# Patient Record
Sex: Female | Born: 1955 | Race: White | Hispanic: No | Smoking: Former smoker
Health system: Southern US, Community
[De-identification: ages and names within clinical notes are randomized; demographics above are authoritative.]

## PROBLEM LIST (undated history)

## (undated) DIAGNOSIS — F1920 Other psychoactive substance dependence, uncomplicated: Secondary | ICD-10-CM

## (undated) DIAGNOSIS — I10 Essential (primary) hypertension: Secondary | ICD-10-CM

## (undated) DIAGNOSIS — E039 Hypothyroidism, unspecified: Secondary | ICD-10-CM

## (undated) DIAGNOSIS — E785 Hyperlipidemia, unspecified: Secondary | ICD-10-CM

## (undated) DIAGNOSIS — N3010 Interstitial cystitis (chronic) without hematuria: Secondary | ICD-10-CM

## (undated) DIAGNOSIS — Q059 Spina bifida, unspecified: Secondary | ICD-10-CM

## (undated) DIAGNOSIS — G43909 Migraine, unspecified, not intractable, without status migrainosus: Secondary | ICD-10-CM

## (undated) DIAGNOSIS — G8929 Other chronic pain: Secondary | ICD-10-CM

## (undated) HISTORY — DX: Essential (primary) hypertension: I10

## (undated) HISTORY — DX: Other chronic pain: G89.29

## (undated) HISTORY — DX: Other psychoactive substance dependence, uncomplicated: F19.20

## (undated) HISTORY — DX: Hypothyroidism, unspecified: E03.9

## (undated) HISTORY — PX: ABDOMINAL HYSTERECTOMY: SHX81

## (undated) HISTORY — DX: Hyperlipidemia, unspecified: E78.5

## (undated) HISTORY — DX: Spina bifida, unspecified: Q05.9

## (undated) HISTORY — PX: COLONOSCOPY: SHX174

## (undated) HISTORY — DX: Interstitial cystitis (chronic) without hematuria: N30.10

## (undated) HISTORY — DX: Migraine, unspecified, not intractable, without status migrainosus: G43.909

## (undated) HISTORY — PX: OOPHORECTOMY: SHX86

---

## 1998-01-28 ENCOUNTER — Other Ambulatory Visit: Admission: RE | Admit: 1998-01-28 | Discharge: 1998-01-28 | Payer: Self-pay | Admitting: *Deleted

## 1998-09-06 ENCOUNTER — Encounter: Payer: Self-pay | Admitting: Neurosurgery

## 1998-09-06 ENCOUNTER — Ambulatory Visit (HOSPITAL_COMMUNITY): Admission: RE | Admit: 1998-09-06 | Discharge: 1998-09-06 | Payer: Self-pay | Admitting: Neurosurgery

## 1998-12-15 ENCOUNTER — Other Ambulatory Visit: Admission: RE | Admit: 1998-12-15 | Discharge: 1998-12-15 | Payer: Self-pay | Admitting: *Deleted

## 2000-03-05 ENCOUNTER — Encounter: Payer: Self-pay | Admitting: Neurosurgery

## 2000-03-05 ENCOUNTER — Encounter: Admission: RE | Admit: 2000-03-05 | Discharge: 2000-03-05 | Payer: Self-pay | Admitting: Neurosurgery

## 2000-06-05 DIAGNOSIS — I10 Essential (primary) hypertension: Secondary | ICD-10-CM

## 2000-06-05 HISTORY — DX: Essential (primary) hypertension: I10

## 2000-12-30 ENCOUNTER — Ambulatory Visit (HOSPITAL_COMMUNITY): Admission: RE | Admit: 2000-12-30 | Discharge: 2000-12-30 | Payer: Self-pay | Admitting: Family Medicine

## 2000-12-30 ENCOUNTER — Encounter: Payer: Self-pay | Admitting: Family Medicine

## 2001-02-24 ENCOUNTER — Emergency Department (HOSPITAL_COMMUNITY): Admission: EM | Admit: 2001-02-24 | Discharge: 2001-02-24 | Payer: Self-pay | Admitting: *Deleted

## 2001-02-24 ENCOUNTER — Encounter: Payer: Self-pay | Admitting: *Deleted

## 2001-04-11 ENCOUNTER — Encounter (INDEPENDENT_AMBULATORY_CARE_PROVIDER_SITE_OTHER): Payer: Self-pay | Admitting: Specialist

## 2001-04-11 ENCOUNTER — Observation Stay (HOSPITAL_COMMUNITY): Admission: RE | Admit: 2001-04-11 | Discharge: 2001-04-12 | Payer: Self-pay | Admitting: *Deleted

## 2001-12-30 ENCOUNTER — Other Ambulatory Visit: Admission: RE | Admit: 2001-12-30 | Discharge: 2001-12-30 | Payer: Self-pay | Admitting: *Deleted

## 2003-01-05 ENCOUNTER — Other Ambulatory Visit: Admission: RE | Admit: 2003-01-05 | Discharge: 2003-01-05 | Payer: Self-pay | Admitting: *Deleted

## 2004-04-27 ENCOUNTER — Other Ambulatory Visit: Admission: RE | Admit: 2004-04-27 | Discharge: 2004-04-27 | Payer: Self-pay | Admitting: *Deleted

## 2004-08-28 ENCOUNTER — Emergency Department (HOSPITAL_COMMUNITY): Admission: EM | Admit: 2004-08-28 | Discharge: 2004-08-28 | Payer: Self-pay | Admitting: Emergency Medicine

## 2005-02-08 ENCOUNTER — Ambulatory Visit (HOSPITAL_COMMUNITY): Admission: RE | Admit: 2005-02-08 | Discharge: 2005-02-08 | Payer: Self-pay | Admitting: Family Medicine

## 2005-02-08 ENCOUNTER — Inpatient Hospital Stay (HOSPITAL_COMMUNITY): Admission: AD | Admit: 2005-02-08 | Discharge: 2005-02-10 | Payer: Self-pay | Admitting: Family Medicine

## 2005-02-09 ENCOUNTER — Encounter (INDEPENDENT_AMBULATORY_CARE_PROVIDER_SITE_OTHER): Payer: Self-pay | Admitting: General Surgery

## 2005-06-13 ENCOUNTER — Other Ambulatory Visit: Admission: RE | Admit: 2005-06-13 | Discharge: 2005-06-13 | Payer: Self-pay | Admitting: *Deleted

## 2006-10-02 ENCOUNTER — Other Ambulatory Visit: Admission: RE | Admit: 2006-10-02 | Discharge: 2006-10-02 | Payer: Self-pay | Admitting: *Deleted

## 2007-07-02 ENCOUNTER — Ambulatory Visit (HOSPITAL_COMMUNITY): Admission: RE | Admit: 2007-07-02 | Discharge: 2007-07-02 | Payer: Self-pay | Admitting: Urology

## 2007-07-18 ENCOUNTER — Ambulatory Visit (HOSPITAL_BASED_OUTPATIENT_CLINIC_OR_DEPARTMENT_OTHER): Admission: RE | Admit: 2007-07-18 | Discharge: 2007-07-18 | Payer: Self-pay | Admitting: Urology

## 2007-11-18 ENCOUNTER — Other Ambulatory Visit: Admission: RE | Admit: 2007-11-18 | Discharge: 2007-11-18 | Payer: Self-pay | Admitting: Gynecology

## 2008-01-03 ENCOUNTER — Encounter: Admission: RE | Admit: 2008-01-03 | Discharge: 2008-01-03 | Payer: Self-pay | Admitting: Gynecology

## 2010-06-25 ENCOUNTER — Encounter: Payer: Self-pay | Admitting: Family Medicine

## 2010-06-26 ENCOUNTER — Encounter: Payer: Self-pay | Admitting: Preventative Medicine

## 2010-10-18 NOTE — Op Note (Signed)
Cheryl Gordon, Cheryl Gordon                 ACCOUNT NO.:  [id number]   MEDICAL RECORD NO.:  [id number]          PATIENT TYPE:  AMB   LOCATION:  [facility]                         FACILITY:  [facility]   PHYSICIAN:  Excell Seltzer. Annabell Howells, [name]    DATE OF BIRTH:  April 09, 1956   DATE OF PROCEDURE:  07/18/2007  DATE OF DISCHARGE:                               OPERATIVE REPORT   PROCEDURE:  Cystoscopy, hydrodistention of the bladder, urethral  dilation, instillation of Pyridium and Marcaine.   PREOPERATIVE DIAGNOSIS:  Painful bladder, rule out interstitial  cystitis.   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   SURGEON:  Excell Seltzer. Annabell Howells, [name]   ANESTHESIA:  General.   COMPLICATIONS:  None.   INDICATIONS:  Ms. Braaksma is a 55 year old white female who I originally  saw in December for voiding symptoms with some incontinence and vaginal  pain.  She had nocturia x2.  Urodynamic study demonstrated some cough  and Valsalva triggered detrusor instability with mixed incontinence, and  she did have bladder pain at a capacity of 259 mL.  It was felt that  cystoscopy, HOD, urethral calibration, and instillation of Pyridium and  Marcaine were indicated to evaluate for interstitial cystitis.   FINDINGS AND PROCEDURE:  The patient was given Cipro.  She was taken to  the operating room, and general anesthetic was induced.  She was placed  in lithotomy position.  Her perineum and genitalia were prepped with  Betadine solution.  She was draped in the usual sterile fashion.   Cystoscopy was initially attempted with a 22-French scope, but the  urethral meatus was too narrow.  She was calibrated from 22 to 26-French  with female sounds.  She was tight at 22, and the 22-French cystoscope  was then inserted.  Examination with the 12 and 70-degree lenses  revealed a pale mucosa.  There were some minimal areas of increased  vascularity that were suggestive of possible  inflammation, but this was  a very subtle finding.  The ureteral orifices were unremarkable,  effluxing clear urine. After a thorough inspection of the bladder,  hydrodistention was performed under 80 cm of water pressure to capacity.  The bladder was left filled for 3 minutes and then drained.  Inspection  revealed some glomerular hemorrhages, primarily in the area of the  trigone but also in one portion of the posterior wall, but they were not  diffuse and extensive.  Her capacity under anesthesia was 800 mL.  After  completion of the hydrodistention, her urethra was further calibrated to  32-French with female sounds, and then the  bladder was instilled with 30 mL of 0.25% Marcaine with 400 mg of  crushed Pyridium.  She was taken down from lithotomy position.  Her  anesthetic was reversed.   She was moved to the recovery room in stable condition.  There were no  complications.      Excell Seltzer. Annabell Howells, [name]  Electronically Signed     JJW/MEDQ  D:  07/18/2007  T:  07/19/2007  Job:  [id number]   cc:  Hulan Saas, [name]   Almedia Balls. [name], [name]  Fax: [phone]

## 2010-10-21 NOTE — Discharge Summary (Signed)
Cheryl Gordon, Cheryl Gordon NO.:  [id number]   MEDICAL RECORD NO.:  [id number]          PATIENT TYPE:  INP   LOCATION:  A223                          FACILITY:  [facility]   PHYSICIAN:  Dalia Heading, [name]  DATE OF BIRTH:  1955-11-06   DATE OF ADMISSION:  02/08/2005  DATE OF DISCHARGE:  09/08/2006LH                                 DISCHARGE SUMMARY   HOSPITAL COURSE:  The patient is a 55 year old, white female who presented  to the hospital with cholecystitis secondary to cholelithiasis.  This was  confirmed by ultrasound as an outpatient.  Her liver enzyme tests were noted  to be elevated.  Her common bile duct was noted be slightly dilated.  Surgery consultation was obtained and the patient was taken the following  day, on February 09, 2005, and underwent laparoscopic cholecystectomy with  cholangiograms.  She tolerated the procedure well.  Cholangiograms were  negative. Her diet was advanced without difficulty.   The following day, her liver enzyme tests have almost returned to normal.  Other labs are stable.  She is tolerating p.o. well.  She is being  discharged home on postop day #1 in good and improving condition.   FOLLOW UP:  The patient is to follow up Dr. Franky Macho on February 16, 2005.   DISCHARGE MEDICATIONS:  1.  Percocet 1-2 tablets p.o. q.6h. p.r.n. pain.  2.  She is to resume all her other medications as previously prescribed.   DISCHARGE DIAGNOSES:  1.  Cholecystitis, cholelithiasis.  2.  Hypothyroidism.  3.  Chronic back pain.   PROCEDURE:  Laparoscopic cholecystectomy with cholangiograms on February 09, 2005.      Dalia Heading, [name]  Electronically Signed     MAJ/MEDQ  D:  02/10/2005  T:  02/10/2005  Job:  [id number]

## 2010-10-21 NOTE — Discharge Summary (Signed)
[facility]  Patient:    Cheryl Gordon, Cheryl Gordon [facility] Visit Number: [id number] MRN: [id number]          Service Type: GYN Location: 4W 0456 01 Attending Physician:  Collene Schlichter Dictated by:   Almedia Balls. [name], [name] Admit Date:  04/11/2001 Discharge Date: 04/12/2001                             Discharge Summary  HISTORY OF PRESENT ILLNESS:  Patient is a 55 year old with abnormal uterine bleeding, pelvic pain, history of endometriosis, probable adenomyosis, and ovarian cysts for hysterectomy and bilateral salpingo-oophorectomy.  The remainder of her history and physical are as previously dictated.  LABORATORY DATA:  Preoperative hemoglobin 12.2.  Chemistries are all normal.  HOSPITAL COURSE:  The patient was taken to the operating room on the morning of April 11, 2001, at which time abdominal supracervical hysterectomy and bilateral salpingo-oophorectomy were performed without difficulty.  Patient did well postoperatively.  Diet and ambulation were progressed over the evening of November 7 and on the morning of November 8.  On the morning of November 8, she was experiencing only pain which was relieved with pain medication, and it was felt that she could be discharged at this time.  FINAL DIAGNOSES:  Abnormal uterine bleeding, pelvic pain, history of endometriosis, probable adenomyosis, ovarian cysts.  OPERATION:  Abdominal supracervical hysterectomy, bilateral salpingo-oophorectomy.  Pathology report is unavailable at the time of dictation.  DISPOSITION:  Discharged home to return to the office in two weeks for follow-up.  She is instructed to gradually progress her activities over several weeks at home and to limit lifting and driving for two weeks.  She was fully ambulatory, on a regular diet, and in good condition at the time of discharge.  She was given a prescription for Dilaudid 2 mg #30 or generic to be taken one or two q.4h. p.r.n. pain, and  doxycycline 100 mg #12 to be taken one b.i.d., and Vivelle dot 0.05 mg patch to be changed twice a week.  She will call for any problems. Dictated by:   Almedia Balls [name], [name] Attending Physician:  Collene Schlichter DD:  04/12/01 TD:  04/13/01 Job: [id number]

## 2010-10-21 NOTE — Op Note (Signed)
[facility]  Patient:    Cheryl Gordon, Cheryl Gordon [facility] Visit Number: [id number] MRN: [id number]          Service Type: GYN Location: 4W 0456 01 Attending Physician:  Collene Schlichter Dictated by:   Almedia Balls. [name], [name] Proc. Date: 04/11/01 Admit Date:  04/11/2001 Discharge Date: 04/12/2001   CC:         Leatha Gilding. Mezer, [name]   Operative Report  PREOPERATIVE DIAGNOSIS:  Abnormal uterine bleeding, pelvic pain, probable adenomyosis, history of endometriosis, recurring ovarian cyst.  POSTOPERATIVE DIAGNOSIS:  Abnormal uterine bleeding, pelvic pain, probable adenomyosis, history of endometriosis, recurring ovarian cyst; pending pathology.  OPERATION/PROCEDURE:  Abdominal supracervical hysterectomy, bilateral salpingo-oophorectomy.  ANESTHESIA:  General oral tracheal.  SURGEON:  Almedia Balls. [name], [name]  FIRST ASSISTANT:  Leatha Gilding. Mezer, [name]  INDICATIONS FOR SURGERY:  The patient is a 55 year old with the above noted problems who was counseled as to the need for further to treat these problems. She was fully counseled as to the nature of the procedure and the risks involved to include risks of anesthesia, injury to bowel, bladder, blood vessels, ureters, postoperative hemorrhage, infection, and recuperation.  She was also counseled as to the need for hormone replacement and/or suppression following surgery.  She fully understands all of these considerations and wishes to proceed on April 11, 2001.  OPERATIVE FINDINGS:  On entry into the abdomen, there were noted to be dense scarrings along the site of previous C-section incisions.  On entry into the abdomen, peritoneal cavity proper, the lower liver edge, area of the gallbladder, spleen, kidneys, periaortic areas, and appendix were normal.  We achieved palpation, and/or visualization.  The uterus was a bit posterior and approximately 8-[redacted] weeks gestation size and soft.  There were adhesions involving the  anterior peritoneum and bladder flap to the lower uterine segment and cervix.  The ovaries had simple cysts present.  DESCRIPTION OF PROCEDURE:  With the patient under general anesthesia, prepped and draped in the usual sterile fashion, with the Foley catheter in the bladder, previous surgical incisions were excised, and the incision was carried into the peritoneal cavity without difficulty.  Self retaining retractor was placed, and the bowel was packed off.  Kelly clamps were used to clamp the uterine ovarian anastomosis, tubes, and round ligaments bilaterally for traction and hemostasis.  Round ligaments were transected using Bovie electrocoagulation bilaterally with development of a bladder flap anteriorly and lysis of adhesions in this area, as well as entry in the retroperitoneal space.  The infundibulopelvic ligaments bilaterally were identified well above the course of the ureters, clamped, cut, and doubly ligated with 1 chromic catgut. The uterine vessels were then skeletonized, and then clamped using Heaney clamps, cut and suture ligated with 1 chromic catgut.  The bladder was advanced over the lower uterine segment and cervix without difficulty by using Bovie electrocoagulation.  Heaney clamps were used to clamp the remaining portions of the Cardinal ligaments bilaterally which were then cut and suture ligated with #1 chromic catgut.  It was then possible to excise the uterine fundus, tubes, and ovaries as a single specimen by using Bovie electrocoagulation to transect the cervix.  Bleeders were rendered hemostatic with interrupted figure-of-eight sutures of 1 chromic catgut.  The endocervix was coagulated using Bovie electrocoagulation for destruction of any remaining endocervical glands.  The area was then lavaged with copious amounts of lactated Ringers solution. After noting hemostasis was maintained and that sponge and instrument counts were correct, the peritoneum  was  closed with a continuous suture of #0 Vicryl. The fascia was closed with 2 sutures of #0 Vicryl which were brought from the lateral aspect of the incision and tied separately in the midline. Subcutaneous fat was reapproximated with interrupted sutures of #1 chromic catgut.  Skin was closed with a subcuticular suture of 3-0 plain catgut. Estimated blood loss 250 mL.  The patient was taken to the recovery room in good condition with clear urine and a Foley catheter tubing.  She will be placed on 23 hour observation following surgery. Dictated by:   Almedia Balls [name], [name] Attending Physician:  Collene Schlichter DD:  04/11/01 TD:  04/12/01 Job: [phone]

## 2010-10-21 NOTE — H&P (Signed)
NAME:  Cheryl Gordon, BRAMBLETT                 ACCOUNT NO.:  [id number]   MEDICAL RECORD NO.:  [id number]          PATIENT TYPE:  INP   LOCATION:  A223                          FACILITY:  [facility]   PHYSICIAN:  Scott A. Gerda Diss, MD    DATE OF BIRTH:  July 11, 1955   DATE OF ADMISSION:  02/08/2005  DATE OF DISCHARGE:  LH                                HISTORY & PHYSICAL   CHIEF COMPLAINT:  Abdominal pain and discomfort.   HISTORY OF PRESENT ILLNESS:  This is a 55 year old white female who has had  a couple different bouts over the past week of discomfort into the chest and  right upper quadrant.  She felt nauseated.  In addition to this, she relates  moderate right upper quadrant tenderness and some flank pain and discomfort.  She was seen in the office.  Ultrasound was ordered.  Ultrasound showed  gallstones.  She was having some soreness, but no severe pain.  She was  treated with Percocet, but later that evening she had severe pain and  discomfort, and, therefore, she was brought here to the emergency department  and admitted.   PAST MEDICAL HISTORY:  1.  Hypothyroidism.  2.  Hypertension.  3.  Chronic pain.  4.  Migraines.   MEDICATION LIST:  1.  Synthroid 0.1 mg daily.  2.  Altace 10 mg b.i.d.  3.  Estradiol 1 mg nightly.  (At one time, she was on Toprol and clonidine, but husband, who gave the  information regarding the medications, does not think she is still on that.)  1.  She was given a prescription for Percocet approximately 12 hours before      admission to use for pain, #24, no refills.   FAMILY HISTORY:  She has a family history of heart disease and hypertension.   ALLERGIES:  1.  SULFA.  Also has had side effects with -  1.  EFFEXOR.  2.  PROZAC.  3.  ZITHROMAX.   SOCIAL HISTORY:  She is married.  She has 4 children.  Does not smoke or  drink.   REVIEW OF SYSTEMS:  See per above.   PHYSICAL EXAMINATION:  GENERAL:  In no acute distress.  HEENT:  Tympanic membranes  __________.  NECK:  Supple.  CHEST:  Clear to auscultation.  No crackles.  HEART:  Regular.  No murmurs.  ABDOMEN:  Soft with right upper quadrant tenderness to palpation.  Mild  flank discomfort on the right side.  Lower abdomen nontender.  EXTREMITIES:  No edema.  NEUROLOGIC:  Grossly normal.   LABORATORY DATA:  White count 10,000.  The patient does have slight left  shift.  In addition, the ultrasound showed gallstones and cholelithiasis, an  equivocal finding for acute cholecystitis, mild CVD, and intrahepatic  dilatation.   ASSESSMENT AND PLAN:  Acute cholecystitis.  Pain medication, nausea  medicine.  Cover with antibiotics.  Go ahead and have Dr. Franky Macho see  the patient.  Mostly likely will need to have her gallbladder taken out in  the next 1-2 days, and follow up accordingly in  the hospital.      Lorin Picket A. Gerda Diss, MD  Electronically Signed     SAL/MEDQ  D:  02/09/2005  T:  02/09/2005  Job:  [id number]

## 2010-10-21 NOTE — Op Note (Signed)
Cheryl Gordon, Cheryl Gordon                 ACCOUNT NO.:  [id number]   MEDICAL RECORD NO.:  [id number]          PATIENT TYPE:  INP   LOCATION:  A223                          FACILITY:  [facility]   PHYSICIAN:  Dalia Heading, [name]  DATE OF BIRTH:  02-Sep-1955   DATE OF PROCEDURE:  02/09/2005  DATE OF DISCHARGE:                                 OPERATIVE REPORT   PREOPERATIVE DIAGNOSIS:  Cholecystitis, cholelithiasis.   POSTOPERATIVE DIAGNOSIS:  Cholecystitis, cholelithiasis.   PROCEDURE:  Laparoscopic cholecystectomy with cholangiograms.   SURGEON:  Dr. Franky Macho.   ANESTHESIA:  General endotracheal.   INDICATIONS:  The patient is a 55 year old white female who presented to  [facility] yesterday evening with cholecystitis secondary to  cholelithiasis confirmed by ultrasound. Risks and benefits of the procedure  including bleeding, infection, hepatobiliary injury, and the possibility of  open procedure were fully explained to the patient, who gave informed  consent.   PROCEDURE NOTE:  The patient was placed in supine position. After induction  of general endotracheal anesthesia, the abdomen was prepped and draped using  the usual sterile technique with Betadine. Surgical site confirmation was  performed.   A supraumbilical incision was made down to the fascia. A Veress needle was  introduced into abdominal cavity, and confirmation of placement was done  using the saline drop test. The abdomen was then insufflated to 16 mmHg  pressure. An 11-mm trocar was introduced into the abdominal cavity under  direct visualization without difficulty. The patient was placed in reversed  Trendelenburg position. An additional 11-mm trocar was placed epigastric  region and 5-mm trocars were placed in the right upper quadrant and right  flank regions. Liver was inspected and noted to normal limits. The  gallbladder was retracted superiorly and laterally. The dissection was begun  around the  infundibulum of the gallbladder. The cystic duct was first  identified. Its juncture to the infundibulum fully identified. A single  Endoclip was placed proximally on the cystic duct. An incision was made in  the cystic duct, and a cholangiogram catheter was inserted. Under  fluoroscopy, cholangiograms were performed. The hepatobiliary tree showed no  evidence of intrahepatic or any filling defects. The dye flowed freely into  the duodenum. The system was then flushed with normal saline, and the  catheter was removed. Endoclips were placed distally on the cystic duct, and  cystic duct was divided. The cystic artery was likewise ligated and divided.  The gallbladder was freed away from the gallbladder fossa using Bovie  electrocautery. The gallbladder delivered through the epigastric trocar site  using an EndoCatch bag. The gallbladder fossa was inspected, and no abnormal  bleeding or bile leakage was noted. Surgicel was placed in the gallbladder  fossa. All fluid and air were then evacuated from the abdominal cavity prior  to removal of the trocars.   All wounds were irrigated with normal saline. All wounds were injected with  0.5% Sensorcaine. The supraumbilical fascia was reapproximated using a 0  Vicryl interrupted suture. All skin incisions were closed using staples.  Betadine ointment and dry sterile  dressings were applied.   All tape and needle counts were correct at the end of the procedure. The  patient was extubated in the operating room and went back to recovery room  awake in stable condition.   COMPLICATIONS:  None.   SPECIMEN:  Gallbladder.   BLOOD LOSS:  Minimal.      Dalia Heading, [name]  Electronically Signed     MAJ/MEDQ  D:  02/09/2005  T:  02/09/2005  Job:  [id number]

## 2010-10-21 NOTE — Procedures (Signed)
NAME:  SAVAYA, HAKES                 ACCOUNT NO.:  [id number]   MEDICAL RECORD NO.:  [id number]          PATIENT TYPE:  INP   LOCATION:  A223                          FACILITY:  [facility]   PHYSICIAN:  Scott A. Gerda Diss, MD    DATE OF BIRTH:  Oct 12, 1955   DATE OF PROCEDURE:  02/08/2005  DATE OF DISCHARGE:  02/10/2005                                EKG INTERPRETATION   Normal sinus rhythm. No acute ST segment changes. Normal EKG.      Scott A. Gerda Diss, MD  Electronically Signed     SAL/MEDQ  D:  02/20/2005  T:  02/20/2005  Job:  [id number]

## 2011-02-24 LAB — POCT I-STAT 4, (NA,K, GLUC, HGB,HCT)
Glucose, Bld: 97
HCT: 43
Hemoglobin: 14.6
Operator id: 280881
Potassium: 3.7
Sodium: 135

## 2012-08-30 ENCOUNTER — Other Ambulatory Visit: Payer: Self-pay | Admitting: Family Medicine

## 2012-09-19 ENCOUNTER — Ambulatory Visit (INDEPENDENT_AMBULATORY_CARE_PROVIDER_SITE_OTHER): Payer: BC Managed Care – PPO | Admitting: Family Medicine

## 2012-09-19 ENCOUNTER — Encounter: Payer: Self-pay | Admitting: Family Medicine

## 2012-09-19 VITALS — BP 110/80 | Temp 98.1°F | Ht 64.0 in | Wt 152.4 lb

## 2012-09-19 DIAGNOSIS — E785 Hyperlipidemia, unspecified: Secondary | ICD-10-CM

## 2012-09-19 DIAGNOSIS — E039 Hypothyroidism, unspecified: Secondary | ICD-10-CM

## 2012-09-19 DIAGNOSIS — N390 Urinary tract infection, site not specified: Secondary | ICD-10-CM

## 2012-09-19 LAB — POCT URINALYSIS DIPSTICK
Spec Grav, UA: 1.01
pH, UA: 5

## 2012-09-19 MED ORDER — CIPROFLOXACIN HCL 250 MG PO TABS: 250.0000 mg | ORAL_TABLET | Freq: Two times a day (BID) | ORAL | Status: AC

## 2012-09-19 NOTE — Progress Notes (Signed)
  Subjective:    Patient ID: Cheryl Gordon, female    DOB: Feb 10, 1956, 57 y.o.   MRN: [id number]  Urinary Tract Infection  This is a new problem. The current episode started in the past 7 days. The problem occurs every urination. The problem has been gradually worsening. The quality of the pain is described as burning. The pain is at a severity of 4/10. The pain is moderate. There has been no fever. She is sexually active. There is a history of pyelonephritis. Associated symptoms include chills, frequency and hesitancy. Pertinent negatives include no flank pain, hematuria or nausea. She has tried nothing for the symptoms. The treatment provided no relief. Her past medical history is significant for recurrent UTIs.    Patient also notes that she has been working on her cholesterol intake. Exercising more. Wonders about her numbers.  Patient also working on compliance with thyroid medicine. Reports overall energy improved. Due to have TSH. Review of Systems  Constitutional: Positive for chills.  Gastrointestinal: Negative for nausea.  Genitourinary: Positive for hesitancy and frequency. Negative for hematuria and flank pain.       Objective:   Physical Exam  Alert no acute distress. Vitals stable. Thyroid nonpalpable. Lungs clear. Heart regular in rhythm. No true CVA tenderness.      Assessment & Plan:  Impression #1 UTI 2-4 white blood cells per high-power field. #2 low thyroid status uncertain. #3 hyperlipidemia status uncertain. Plan Cipro as prescribed. Appropriate blood work. WSL

## 2012-09-30 ENCOUNTER — Other Ambulatory Visit: Payer: Self-pay | Admitting: Family Medicine

## 2012-11-15 ENCOUNTER — Other Ambulatory Visit: Payer: Self-pay | Admitting: Family Medicine

## 2012-11-18 ENCOUNTER — Encounter: Payer: Self-pay | Admitting: *Deleted

## 2012-11-21 ENCOUNTER — Other Ambulatory Visit: Payer: Self-pay | Admitting: Family Medicine

## 2012-11-29 ENCOUNTER — Other Ambulatory Visit: Payer: Self-pay | Admitting: Family Medicine

## 2012-12-10 LAB — LIPID PANEL
Cholesterol: 202 mg/dL — ABNORMAL HIGH (ref 0–200)
HDL: 34 mg/dL — ABNORMAL LOW (ref >39)
LDL Cholesterol: 137 mg/dL — ABNORMAL HIGH (ref 0–99)
Total CHOL/HDL Ratio: 5.9 Ratio
Triglycerides: 155 mg/dL — ABNORMAL HIGH (ref <150)
VLDL: 31 mg/dL (ref 0–40)

## 2012-12-10 LAB — TSH: TSH: 0.181 u[IU]/mL — ABNORMAL LOW (ref 0.350–4.500)

## 2012-12-17 ENCOUNTER — Other Ambulatory Visit: Payer: Self-pay | Admitting: *Deleted

## 2012-12-17 MED ORDER — LEVOTHYROXINE SODIUM 125 MCG PO TABS: 137.0000 ug | ORAL_TABLET | Freq: Every day | ORAL | Status: DC

## 2012-12-27 ENCOUNTER — Ambulatory Visit (INDEPENDENT_AMBULATORY_CARE_PROVIDER_SITE_OTHER): Payer: BC Managed Care – PPO | Admitting: Family Medicine

## 2012-12-27 ENCOUNTER — Encounter: Payer: Self-pay | Admitting: Family Medicine

## 2012-12-27 VITALS — BP 98/60 | Temp 98.2°F | Wt 149.2 lb

## 2012-12-27 DIAGNOSIS — N3010 Interstitial cystitis (chronic) without hematuria: Secondary | ICD-10-CM

## 2012-12-27 DIAGNOSIS — E039 Hypothyroidism, unspecified: Secondary | ICD-10-CM

## 2012-12-27 DIAGNOSIS — M549 Dorsalgia, unspecified: Secondary | ICD-10-CM

## 2012-12-27 DIAGNOSIS — N390 Urinary tract infection, site not specified: Secondary | ICD-10-CM

## 2012-12-27 DIAGNOSIS — N3941 Urge incontinence: Secondary | ICD-10-CM

## 2012-12-27 DIAGNOSIS — G8929 Other chronic pain: Secondary | ICD-10-CM

## 2012-12-27 DIAGNOSIS — E785 Hyperlipidemia, unspecified: Secondary | ICD-10-CM

## 2012-12-27 LAB — POCT URINALYSIS DIPSTICK
Spec Grav, UA: <=1.005
pH, UA: 7

## 2012-12-27 NOTE — Progress Notes (Signed)
  Subjective:    Patient ID: Cheryl Gordon, female    DOB: 1955-10-16, 57 y.o.   MRN: [id number]  HPI Up two hours every night to urinate. Hx of increased urinary freq/ literally every hour  Med that start with o  Wears pad if accident  Mixed urinary freq.  Hx of stress and urge incont.--spuse wonders if may need disability.  Wonders if needs to be on disability/very long discussion held in this regard. Patient has history of chronic pain. She also has history of migraine headaches. She is having a lot of fatigue and tiredness. 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  Patient also has history of interstitial cystitis.   Review of Systems No fever no chills no depression no vomiting no rash ROS otherwise negative    Objective:   Physical Exam  Alert no acute distress. Lungs clear. Heart regular rate and rhythm. HEENT normal. Back spine tender to percussion. No CVA tenderness. Urinalysis unremarkable.      Assessment & Plan:  Impression stress and urge incontinence discussed. Detrol and other similar agents did not help much in the past. #2 interstitial cystitis ongoing challenge. #3 chronic pain also ongoing challenge. Complicated by history of substance abuse. #4 hypothyroidism plan prescription written for Vesicare patient to try. Start at 5 mg. Long discussion held about the nature of both bureaucratic in legalistic of pursuing disability status. If patient anddeciding on that pathway I will support her

## 2012-12-29 DIAGNOSIS — G8929 Other chronic pain: Secondary | ICD-10-CM | POA: Insufficient documentation

## 2012-12-29 DIAGNOSIS — M549 Dorsalgia, unspecified: Secondary | ICD-10-CM | POA: Insufficient documentation

## 2012-12-29 DIAGNOSIS — N3010 Interstitial cystitis (chronic) without hematuria: Secondary | ICD-10-CM | POA: Insufficient documentation

## 2012-12-29 DIAGNOSIS — N3941 Urge incontinence: Secondary | ICD-10-CM | POA: Insufficient documentation

## 2013-01-21 ENCOUNTER — Other Ambulatory Visit: Payer: Self-pay | Admitting: Family Medicine

## 2013-01-31 ENCOUNTER — Other Ambulatory Visit: Payer: Self-pay | Admitting: Family Medicine

## 2013-02-14 ENCOUNTER — Other Ambulatory Visit: Payer: Self-pay | Admitting: Family Medicine

## 2013-02-14 ENCOUNTER — Ambulatory Visit (INDEPENDENT_AMBULATORY_CARE_PROVIDER_SITE_OTHER): Payer: BC Managed Care – PPO | Admitting: Urology

## 2013-02-14 DIAGNOSIS — R3989 Other symptoms and signs involving the genitourinary system: Secondary | ICD-10-CM

## 2013-02-14 DIAGNOSIS — N3010 Interstitial cystitis (chronic) without hematuria: Secondary | ICD-10-CM

## 2013-03-27 ENCOUNTER — Other Ambulatory Visit: Payer: Self-pay | Admitting: Family Medicine

## 2013-04-14 ENCOUNTER — Other Ambulatory Visit: Payer: Self-pay | Admitting: Family Medicine

## 2013-05-02 ENCOUNTER — Encounter: Payer: Self-pay | Admitting: Family Medicine

## 2013-05-02 ENCOUNTER — Ambulatory Visit (INDEPENDENT_AMBULATORY_CARE_PROVIDER_SITE_OTHER): Payer: BC Managed Care – PPO | Admitting: Family Medicine

## 2013-05-02 VITALS — BP 128/80 | Ht 64.0 in | Wt 153.4 lb

## 2013-05-02 DIAGNOSIS — M76899 Other specified enthesopathies of unspecified lower limb, excluding foot: Secondary | ICD-10-CM

## 2013-05-02 DIAGNOSIS — M7062 Trochanteric bursitis, left hip: Secondary | ICD-10-CM

## 2013-05-02 MED ORDER — PREDNISONE 20 MG PO TABS: ORAL_TABLET | ORAL | Status: AC

## 2013-05-02 NOTE — Patient Instructions (Signed)
This is trochanteric bursitis

## 2013-05-02 NOTE — Progress Notes (Signed)
   Subjective:    Patient ID: Cheryl Gordon, female    DOB: November 04, 1955, 57 y.o.   MRN: [id number]  Leg Pain  The incident occurred more than 1 week ago. The incident occurred at home. There was no injury mechanism (Started when she was walking on the treadmill). The pain is present in the left leg and left hip. The pain has been constant since onset. She reports no foreign bodies present. The symptoms are aggravated by movement and weight bearing. She has tried NSAIDs, rest and non-weight bearing for the symptoms. The treatment provided no relief.   Throbbing severe at times Seemed to start after treadmill use  Rad down to leg, severe at time  Bad at night, aching,  ibo usually takes two every four hrs, had dental surg recently  Some limping with walking. Lat hip,    Review of Systems No change in urinary habits no change about habits relatively mild low back pain no abdominal pain. ROS otherwise negative    Objective:   Physical Exam  Alert no apparent distress. Lungs clear. Heart regular in rhythm. No spinal tenderness. No [place] Terrace. Negative straight leg raise on left. Positive lateral hip tenderness to deep palpation. Positive pain with external rotation. Distal strength sensation intact.      Assessment & Plan:  Impression trochanteric bursitis fairly severe. Plan prednisone taper. Local measures discussed. Local exercises discussed. WSL

## 2013-06-02 ENCOUNTER — Other Ambulatory Visit (HOSPITAL_COMMUNITY): Payer: Self-pay | Admitting: Orthopedic Surgery

## 2013-06-02 ENCOUNTER — Other Ambulatory Visit: Payer: Self-pay | Admitting: Family Medicine

## 2013-06-02 DIAGNOSIS — M545 Low back pain: Secondary | ICD-10-CM

## 2013-06-04 ENCOUNTER — Ambulatory Visit (HOSPITAL_COMMUNITY)
Admission: RE | Admit: 2013-06-04 | Discharge: 2013-06-04 | Disposition: A | Discharge status: Home / Self Care | Payer: BC Managed Care – PPO | Source: Ambulatory Visit | Attending: Orthopedic Surgery | Admitting: Orthopedic Surgery

## 2013-06-04 ENCOUNTER — Encounter (HOSPITAL_COMMUNITY): Payer: Self-pay

## 2013-06-04 DIAGNOSIS — M545 Low back pain, unspecified: Secondary | ICD-10-CM | POA: Insufficient documentation

## 2013-06-04 DIAGNOSIS — M51379 Other intervertebral disc degeneration, lumbosacral region without mention of lumbar back pain or lower extremity pain: Secondary | ICD-10-CM | POA: Insufficient documentation

## 2013-06-04 DIAGNOSIS — M5137 Other intervertebral disc degeneration, lumbosacral region: Secondary | ICD-10-CM | POA: Insufficient documentation

## 2013-06-04 DIAGNOSIS — M79609 Pain in unspecified limb: Secondary | ICD-10-CM | POA: Insufficient documentation

## 2013-06-04 DIAGNOSIS — Q062 Diastematomyelia: Secondary | ICD-10-CM | POA: Insufficient documentation

## 2013-06-09 ENCOUNTER — Ambulatory Visit (INDEPENDENT_AMBULATORY_CARE_PROVIDER_SITE_OTHER): Payer: BC Managed Care – PPO | Admitting: Family Medicine

## 2013-06-09 ENCOUNTER — Encounter: Payer: Self-pay | Admitting: Family Medicine

## 2013-06-09 VITALS — BP 100/60 | Temp 98.4°F | Ht 64.0 in | Wt 152.1 lb

## 2013-06-09 DIAGNOSIS — J329 Chronic sinusitis, unspecified: Secondary | ICD-10-CM

## 2013-06-09 MED ORDER — CEFDINIR 300 MG PO CAPS: 300.0000 mg | ORAL_CAPSULE | Freq: Two times a day (BID) | ORAL | Status: DC

## 2013-06-09 NOTE — Progress Notes (Signed)
   Subjective:    Patient ID: Cheryl Gordon, female    DOB: September 28, 1955, 58 y.o.   MRN: [id number]  Sore Throat  This is a new problem. The current episode started in the past 7 days. The problem has been unchanged. Neither side of throat is experiencing more pain than the other. The maximum temperature recorded prior to her arrival was 100 - 100.9 F. The fever has been present for 1 to 2 days. The pain is at a severity of 7/10. The pain is moderate. Associated symptoms include vomiting. Associated symptoms comments: Body aches. She has tried acetaminophen for the symptoms. The treatment provided mild relief.    Bad sore throat, felt achey soon after  Felt hot,  Headache and muscle aches  Diminished energy Moderate gunky with blowing nose t max 100.4, then diminished  No a lot of cough,  Tend swollen glands, felt nauseated yest Cheryl Gordon somewhat today   Review of Systems  Gastrointestinal: Positive for vomiting.   no rash no but back pain no urinary frequency ROS otherwise negative     Objective:   Physical Exam  Alert moderate malaise frontal maxillary tenderness pharynx normal neck supple. Lungs intermittent cough heart regular in rhythm.      Assessment & Plan:  Impression 1 rhinosinusitis plan Omnicef twice a day 10 days. Symptomatic care discussed. WSL

## 2013-06-11 ENCOUNTER — Other Ambulatory Visit: Payer: Self-pay | Admitting: Family Medicine

## 2013-07-01 ENCOUNTER — Other Ambulatory Visit: Payer: Self-pay | Admitting: Family Medicine

## 2013-07-30 ENCOUNTER — Other Ambulatory Visit: Payer: Self-pay | Admitting: Family Medicine

## 2013-09-18 ENCOUNTER — Other Ambulatory Visit: Payer: Self-pay | Admitting: Family Medicine

## 2013-09-22 ENCOUNTER — Other Ambulatory Visit: Payer: Self-pay | Admitting: Family Medicine

## 2013-09-26 ENCOUNTER — Ambulatory Visit (INDEPENDENT_AMBULATORY_CARE_PROVIDER_SITE_OTHER): Payer: BC Managed Care – PPO | Admitting: Nurse Practitioner

## 2013-09-26 ENCOUNTER — Encounter: Payer: Self-pay | Admitting: Nurse Practitioner

## 2013-09-26 VITALS — BP 102/68 | Temp 98.2°F | Ht 64.0 in | Wt 149.1 lb

## 2013-09-26 DIAGNOSIS — R350 Frequency of micturition: Secondary | ICD-10-CM

## 2013-09-26 LAB — POCT UA - MICROSCOPIC ONLY
Bacteria, U Microscopic: POSITIVE
RBC, urine, microscopic: NEGATIVE

## 2013-09-26 LAB — POCT URINALYSIS DIPSTICK

## 2013-09-26 MED ORDER — CIPROFLOXACIN HCL 250 MG PO TABS: 250.0000 mg | ORAL_TABLET | Freq: Two times a day (BID) | ORAL | Status: DC

## 2013-09-27 ENCOUNTER — Other Ambulatory Visit: Payer: Self-pay | Admitting: Family Medicine

## 2013-09-29 ENCOUNTER — Encounter: Payer: Self-pay | Admitting: Nurse Practitioner

## 2013-09-29 NOTE — Progress Notes (Signed)
Subjective:  Presents for c/o urinary frequency, urgency and incontinence x 2-3 days. Has IC but these are different symptoms. No dysuria, some pressure with urination. No fever. No nausea or vomiting. No vaginal discharge or pelvic pain. Same sexual partner. No mid back or flank pain. Taking fluids well.  Objective:   BP 102/68  Temp(Src) 98.2 F (36.8 C) (Oral)  Ht 5\' 4"  (1.626 m)  Wt 149 lb 2 oz (67.643 kg)  BMI 25.58 kg/m2 NAD. Alert, oriented. Lungs clear. No CVA or flank tenderness. abd soft, nondistended with mild suprapubic area tenderness. UA not performed due to blue color of urine secondary to med. Urine micro: rare WBC, pos bacteria, rare epi cell.  Assessment: Urinary frequency - Plan: POCT urinalysis dipstick, POCT UA - Microscopic Only  Plan:  Meds ordered this encounter  Medications  . promethazine (PHENERGAN) 25 MG tablet    Sig: TAKE ONE TABLET TWICE DAILY PRN  . ciprofloxacin (CIPRO) 250 MG tablet    Sig: Take 1 tablet (250 mg total) by mouth 2 (two) times daily.    Dispense:  10 tablet    Refill:  0    Order Specific Question:  Supervising Provider    Answer:  Mikey Kirschner [[id number]]   Call back if worsens or persists.

## 2013-10-03 ENCOUNTER — Other Ambulatory Visit: Payer: Self-pay | Admitting: Family Medicine

## 2013-10-03 NOTE — Telephone Encounter (Signed)
Seen 4/24 for dysuria

## 2013-11-07 ENCOUNTER — Other Ambulatory Visit: Payer: Self-pay | Admitting: Family Medicine

## 2013-11-21 ENCOUNTER — Other Ambulatory Visit: Payer: Self-pay | Admitting: Family Medicine

## 2013-12-09 ENCOUNTER — Encounter: Payer: Self-pay | Admitting: Family Medicine

## 2013-12-09 ENCOUNTER — Ambulatory Visit (INDEPENDENT_AMBULATORY_CARE_PROVIDER_SITE_OTHER): Payer: BC Managed Care – PPO | Admitting: Family Medicine

## 2013-12-09 VITALS — BP 120/78 | Temp 98.4°F | Ht 63.0 in | Wt 144.0 lb

## 2013-12-09 DIAGNOSIS — H6122 Impacted cerumen, left ear: Secondary | ICD-10-CM

## 2013-12-09 DIAGNOSIS — H6120 Impacted cerumen, unspecified ear: Secondary | ICD-10-CM

## 2013-12-09 DIAGNOSIS — I10 Essential (primary) hypertension: Secondary | ICD-10-CM

## 2013-12-09 DIAGNOSIS — O169 Unspecified maternal hypertension, unspecified trimester: Secondary | ICD-10-CM

## 2013-12-09 NOTE — Progress Notes (Signed)
   Subjective:    Patient ID: Cheryl Gordon, female    DOB: 19-Aug-1955, 58 y.o.   MRN: [id number]  Otalgia  There is pain in the left ear. The current episode started 1 to 4 weeks ago. Associated symptoms comments: Neck stiffness. She has tried ear drops for the symptoms.   Leg pain, went to dr elsner  Still aching and hurting  Felt that scoliosis might need surg  injec didn't help  injec has helped the pain considerably   wks ago very sens left ear, taking a bath and it started to hurt  achey and painful on that side  Stopped up three wks ago Sig pain No si    Patient claims compliance with blood pressure medicine. Watching salt intake. No obvious side effects. Review of Systems  HENT: Positive for ear pain.    no headache no chest pain no abdominal pain     Objective:   Physical Exam  Alert no acute distress vitals stable blood pressure 124/80 on repeat lungs clear heart regular in rhythm. Left ear packed with wax removed with some difficulty      Assessment & Plan:  Impression 1 hypertension good control maintain same meds. #2 cerumen impaction resolved plan diet exercise discussed maintain same meds. WSL

## 2013-12-10 ENCOUNTER — Other Ambulatory Visit: Payer: Self-pay | Admitting: *Deleted

## 2013-12-10 DIAGNOSIS — O169 Unspecified maternal hypertension, unspecified trimester: Secondary | ICD-10-CM | POA: Insufficient documentation

## 2013-12-10 MED ORDER — INDAPAMIDE 2.5 MG PO TABS: 2.5000 mg | ORAL_TABLET | Freq: Every day | ORAL | Status: DC

## 2013-12-10 MED ORDER — RAMIPRIL 10 MG PO CAPS: 10.0000 mg | ORAL_CAPSULE | Freq: Every day | ORAL | Status: DC

## 2013-12-25 ENCOUNTER — Other Ambulatory Visit: Payer: Self-pay | Admitting: Family Medicine

## 2013-12-25 NOTE — Telephone Encounter (Signed)
Seen 7/7

## 2014-01-06 ENCOUNTER — Other Ambulatory Visit: Payer: Self-pay | Admitting: Nurse Practitioner

## 2014-02-12 ENCOUNTER — Other Ambulatory Visit: Payer: Self-pay | Admitting: Family Medicine

## 2014-02-16 ENCOUNTER — Ambulatory Visit (INDEPENDENT_AMBULATORY_CARE_PROVIDER_SITE_OTHER): Payer: BC Managed Care – PPO | Admitting: Family Medicine

## 2014-02-16 ENCOUNTER — Encounter: Payer: Self-pay | Admitting: Family Medicine

## 2014-02-16 VITALS — BP 132/90 | Temp 98.4°F | Ht 63.0 in | Wt 150.2 lb

## 2014-02-16 DIAGNOSIS — B370 Candidal stomatitis: Secondary | ICD-10-CM

## 2014-02-16 MED ORDER — KETOCONAZOLE 2 % EX CREA: 1.0000 "application " | TOPICAL_CREAM | Freq: Two times a day (BID) | CUTANEOUS | Status: DC

## 2014-02-16 MED ORDER — FLUCONAZOLE 150 MG PO TABS: ORAL_TABLET | ORAL | Status: DC

## 2014-02-16 NOTE — Progress Notes (Signed)
   Subjective:    Patient ID: Cheryl Gordon, female    DOB: 10-25-1955, 58 y.o.   MRN: [id number]  HPI Patient is here today due to mouth irritation that has been present for over 1 week now. Patient states that the corners of her mouth are cracked and "it feels like a film" is inside her mouth. Patient has been eating a lot of tomatoes and vinegar and she believes that it could be related.   Patient states that she has no other concerns at this time.   tums prn   Hx of thrush  Not taking anything else   Review of Systems No headache no chest pain no cough no abdominal pain ROS otherwise negative    Objective:   Physical Exam Alert no acute distress corners of mouth irritated cracked pharynx slight erythema some white patches neck supple. No adenitis lungs clear heart regular in rhythm.       Assessment & Plan:  Impression prolapse day with thrush discussed plan appropriate therapy prescribed. Since Medicare discussed. Warning signs discussed. WSL

## 2014-03-06 ENCOUNTER — Other Ambulatory Visit: Payer: Self-pay | Admitting: Family Medicine

## 2014-03-09 ENCOUNTER — Telehealth: Payer: Self-pay | Admitting: Family Medicine

## 2014-03-09 ENCOUNTER — Other Ambulatory Visit: Payer: Self-pay | Admitting: Family Medicine

## 2014-03-09 NOTE — Telephone Encounter (Signed)
Pt wants you to know that the refill she has called into the med if due  To the fact that the yeast infection come back after taking antibiotics  For a tooth  Just an [place]

## 2014-03-09 NOTE — Telephone Encounter (Signed)
ok 

## 2014-03-30 ENCOUNTER — Other Ambulatory Visit: Payer: Self-pay | Admitting: Family Medicine

## 2014-03-30 ENCOUNTER — Other Ambulatory Visit: Payer: Self-pay | Admitting: Nurse Practitioner

## 2014-04-23 ENCOUNTER — Other Ambulatory Visit: Payer: Self-pay | Admitting: Family Medicine

## 2014-04-24 ENCOUNTER — Other Ambulatory Visit: Payer: Self-pay | Admitting: Family Medicine

## 2014-04-24 NOTE — Telephone Encounter (Signed)
Last seen 02/16/14.

## 2014-05-26 ENCOUNTER — Encounter (INDEPENDENT_AMBULATORY_CARE_PROVIDER_SITE_OTHER): Payer: Self-pay

## 2014-05-26 ENCOUNTER — Encounter: Payer: Self-pay | Admitting: Family Medicine

## 2014-05-26 ENCOUNTER — Ambulatory Visit (INDEPENDENT_AMBULATORY_CARE_PROVIDER_SITE_OTHER): Payer: BC Managed Care – PPO | Admitting: Family Medicine

## 2014-05-26 VITALS — BP 130/90 | Temp 99.3°F | Ht 63.0 in | Wt 153.1 lb

## 2014-05-26 DIAGNOSIS — L03116 Cellulitis of left lower limb: Secondary | ICD-10-CM

## 2014-05-26 MED ORDER — DOXYCYCLINE HYCLATE 100 MG PO TABS: 100.0000 mg | ORAL_TABLET | Freq: Two times a day (BID) | ORAL | Status: DC

## 2014-05-26 NOTE — Progress Notes (Signed)
   Subjective:    Patient ID: Cheryl Gordon, female    DOB: January 25, 1956, 58 y.o.   MRN: [id number]  Rash This is a new problem. The current episode started today. The problem is unchanged. The affected locations include the left lower leg and left arm. The rash is characterized by redness. She was exposed to nothing. Past treatments include nothing. The treatment provided no relief.   Patient states that she has no other concerns at this time.  Patient notes left anterior ankle rash. Painful. Swelling. Rather acute. Recalls no injury. Also small bump left arm. Somewhat itchy. May not even be related.  Review of Systems  Skin: Positive for rash.   No high fever no chills no rash elsewhere no cough no vomiting ROS otherwise negative    Objective:   Physical Exam  Alert no acute distress. H&T normal. Vitals stable. Lungs clear. Heart regular in rhythm. Left ankle: Impressive erythematous patch exquisitely tender left forearm small papule nonspecific      Assessment & Plan:  Impression cellulitis likely related to venous stasis discussed plan appropriate antibiotics. Local measures discussed. Warning signs discussed. WSL

## 2014-05-27 ENCOUNTER — Other Ambulatory Visit: Payer: Self-pay | Admitting: Family Medicine

## 2014-06-01 ENCOUNTER — Encounter: Payer: Self-pay | Admitting: Family Medicine

## 2014-06-01 ENCOUNTER — Ambulatory Visit (HOSPITAL_COMMUNITY)
Admission: RE | Admit: 2014-06-01 | Discharge: 2014-06-01 | Disposition: A | Discharge status: Home / Self Care | Payer: BC Managed Care – PPO | Source: Ambulatory Visit | Attending: Family Medicine | Admitting: Family Medicine

## 2014-06-01 ENCOUNTER — Ambulatory Visit (INDEPENDENT_AMBULATORY_CARE_PROVIDER_SITE_OTHER): Payer: BC Managed Care – PPO | Admitting: Family Medicine

## 2014-06-01 VITALS — BP 130/88 | Temp 99.1°F | Ht 63.0 in | Wt 155.0 lb

## 2014-06-01 DIAGNOSIS — D869 Sarcoidosis, unspecified: Secondary | ICD-10-CM

## 2014-06-01 DIAGNOSIS — L52 Erythema nodosum: Secondary | ICD-10-CM

## 2014-06-01 DIAGNOSIS — L539 Erythematous condition, unspecified: Secondary | ICD-10-CM

## 2014-06-01 LAB — CBC WITH DIFFERENTIAL/PLATELET
Basophils Absolute: 0 10*3/uL (ref 0.0–0.1)
Basophils Relative: 0 % (ref 0–1)
Eosinophils Absolute: 0.3 10*3/uL (ref 0.0–0.7)
Eosinophils Relative: 5 % (ref 0–5)
HCT: 34 % — ABNORMAL LOW (ref 36.0–46.0)
Hemoglobin: 11.7 g/dL — ABNORMAL LOW (ref 12.0–15.0)
Lymphocytes Relative: 24 % (ref 12–46)
Lymphs Abs: 1.4 10*3/uL (ref 0.7–4.0)
MCH: 30.4 pg (ref 26.0–34.0)
MCHC: 34.4 g/dL (ref 30.0–36.0)
MCV: 88.3 fL (ref 78.0–100.0)
MPV: 8.3 fL — ABNORMAL LOW (ref 9.4–12.4)
Monocytes Absolute: 0.5 10*3/uL (ref 0.1–1.0)
Monocytes Relative: 9 % (ref 3–12)
Neutro Abs: 3.5 10*3/uL (ref 1.7–7.7)
Neutrophils Relative %: 62 % (ref 43–77)
Platelets: 323 10*3/uL (ref 150–400)
RBC: 3.85 MIL/uL — ABNORMAL LOW (ref 3.87–5.11)
RDW: 12.9 % (ref 11.5–15.5)
WBC: 5.7 10*3/uL (ref 4.0–10.5)

## 2014-06-01 LAB — SEDIMENTATION RATE: Sed Rate: 73 mm/hr — ABNORMAL HIGH (ref 0–22)

## 2014-06-01 MED ORDER — ETODOLAC 400 MG PO TABS: 400.0000 mg | ORAL_TABLET | Freq: Two times a day (BID) | ORAL | Status: DC

## 2014-06-01 NOTE — Patient Instructions (Signed)
Erythema Nodosum Erythema nodosum is also called "painful red nodules on the legs". Symptoms can include sudden onset of fever, fatigue and joint pains. Red, deep, tender, raised, bruise-like bumps form on the shin, or sometimes on the arms or trunk. The skin over the bumps is usually shiny. The symptoms usually last 1-2 weeks. The symptoms usually improve once the cause is treated. This illness may be caused by strep or fungal infection, drug reaction, pregnancy, or other medical conditions. Treating the cause is important to early recovery. Erythema nodosum occurs at any age and in both sexes but more often in young adult women. Erythema nodosum is not a skin infection. It is a reaction to something internal. In most cases the illness and bumps go away with treatment. You should avoid any medicine that may have caused this reaction. Antihistamine drugs, anti-inflammatory drugs or cortisone medicine may be prescribed to relieve symptoms. SSKI drops ([place]) taken with juice at breakfast, lunch and dinner may have an anti-inflammatory effect and speed the healing. Bed rest and limiting vigorous exercise help to shorten the course of erythema nodosum. Elevation of the affected limb also helps in recovery. As the condition gets better, the bumps flatten and your skin may heal with temporary bruise marks. These dark marks will clear up in several months and they are a good sign that your skin is healing.  SEEK IMMEDIATE MEDICAL CARE IF:   Your condition worsens, or if you have more severe symptoms such a high fever, sore throat, or repeated vomiting. Document Released: 06/29/2004 Document Revised: 08/14/2011 Document Reviewed: 07/03/2008 [facility] Patient Information 2015 [place], [place]. This information is not intended to replace advice given to you by your health care provider. Make sure you discuss any questions you have with your health care provider.

## 2014-06-01 NOTE — Progress Notes (Signed)
   Subjective:    Patient ID: Cheryl Gordon, female    DOB: 21-Mar-1956, 58 y.o.   MRN: [id number]  HPI Seen here on 12/22. Seen last week Dx with cellulitis . Had a small patch of what was thought to be cellulitis on left ankle. Along with a nodule left arm. Now has developed further multiple discrete nodules on both legs left anterior thigh and arm  Red, sore spots spreading on legs.  Swelling in the legs.  Had steroid injections Dec. 4 in her back.   Mild malaise. No fever slight arthralgias. Difficult to tell from neuropathic pain  Review of Systems No headache no cough no fever no shortness of breath no other type of rash    Objective:   Physical Exam  Alert vitals stable HEENT normal lungs clear heart regular in rhythm multiple erythematous nodules both legs left anterior thigh left arm      Assessment & Plan:  c impression erythema nodosum. 25 minutes spent most in discussion. Potential Etiology described the patient this is of unusual condition. Plan screening blood work and x-ray. Anti-inflammatory medicine prescribed. Education information given. WSL

## 2014-06-09 ENCOUNTER — Ambulatory Visit (INDEPENDENT_AMBULATORY_CARE_PROVIDER_SITE_OTHER): Payer: BC Managed Care – PPO | Admitting: Family Medicine

## 2014-06-09 ENCOUNTER — Encounter: Payer: Self-pay | Admitting: Family Medicine

## 2014-06-09 VITALS — Ht 63.0 in | Wt 155.0 lb

## 2014-06-09 DIAGNOSIS — I8002 Phlebitis and thrombophlebitis of superficial vessels of left lower extremity: Secondary | ICD-10-CM

## 2014-06-09 DIAGNOSIS — M7989 Other specified soft tissue disorders: Secondary | ICD-10-CM

## 2014-06-09 NOTE — Progress Notes (Signed)
   Subjective:    Patient ID: Cheryl Gordon, female    DOB: 09-Feb-1956, 59 y.o.   MRN: [id number]  HPI Patient arrives for a follow up on legs- still painful and swollen.  Low gr fever and felt bad anc achey  Places got a very sore over the weekend  If anything the patient is now experiencing more discomfort. And more swelling in the left lower leg.   No high fever or chills but definitely low-grade fever.  No history of thrombophlebitis.  No shortness of breath no chest pain not on birth control pills Review of Systems ROS otherwise negative    Objective:   Physical Exam  Alert moderate malaise vitals stable HET normal lungs clear heart regular rate and rhythm left ankle more swollen erythematous quite tender. Negative Homans sign multiple regions of erythema nodosum still present      Assessment & Plan:  Impression worsening left leg sent for DVT workup plan of note d-dimer elevated and ultrasound revealed superficial thrombophlebitis. With presence throughout the saphenous vein, along with other complicating features such as the erythema nodosum, felt anticoagulation with Coumadin was appropriate. Will initiate bring back for further education. WSL

## 2014-06-10 ENCOUNTER — Other Ambulatory Visit: Payer: Self-pay | Admitting: Family Medicine

## 2014-06-10 ENCOUNTER — Telehealth: Payer: Self-pay | Admitting: *Deleted

## 2014-06-10 ENCOUNTER — Other Ambulatory Visit: Payer: Self-pay | Admitting: *Deleted

## 2014-06-10 ENCOUNTER — Ambulatory Visit (HOSPITAL_COMMUNITY)
Admission: RE | Admit: 2014-06-10 | Discharge: 2014-06-10 | Disposition: A | Discharge status: Home / Self Care | Payer: BC Managed Care – PPO | Source: Ambulatory Visit | Attending: Family Medicine | Admitting: Family Medicine

## 2014-06-10 DIAGNOSIS — I82812 Embolism and thrombosis of superficial veins of left lower extremities: Secondary | ICD-10-CM | POA: Insufficient documentation

## 2014-06-10 DIAGNOSIS — M79662 Pain in left lower leg: Secondary | ICD-10-CM | POA: Diagnosis present

## 2014-06-10 DIAGNOSIS — M7989 Other specified soft tissue disorders: Secondary | ICD-10-CM | POA: Insufficient documentation

## 2014-06-10 DIAGNOSIS — M25572 Pain in left ankle and joints of left foot: Secondary | ICD-10-CM | POA: Insufficient documentation

## 2014-06-10 LAB — D-DIMER, QUANTITATIVE (NOT AT ARMC): D-Dimer, Quant: 2.02 ug/mL-FEU — ABNORMAL HIGH (ref 0.00–0.48)

## 2014-06-10 MED ORDER — WARFARIN SODIUM 5 MG PO TABS: 5.0000 mg | ORAL_TABLET | Freq: Every day | ORAL | Status: DC

## 2014-06-10 MED ORDER — DOXYCYCLINE HYCLATE 100 MG PO TABS: 100.0000 mg | ORAL_TABLET | Freq: Two times a day (BID) | ORAL | Status: DC

## 2014-06-10 NOTE — Telephone Encounter (Signed)
Called pt to let her know results of ultrasound per Dr. Richardson Landry. There is a clot in superficial vein. Start doxy 100 #20 one bid and coumadin 5mg  one daily and recheck with office visit tomorrow with Dr. Richardson Landry. Discussed with pt. meds sent to [facility] and pt scheduled office visit tomorrow for a recheck.

## 2014-06-11 ENCOUNTER — Ambulatory Visit (INDEPENDENT_AMBULATORY_CARE_PROVIDER_SITE_OTHER): Payer: BC Managed Care – PPO | Admitting: Family Medicine

## 2014-06-11 ENCOUNTER — Encounter: Payer: Self-pay | Admitting: Family Medicine

## 2014-06-11 VITALS — Ht 63.0 in | Wt 155.0 lb

## 2014-06-11 DIAGNOSIS — I8002 Phlebitis and thrombophlebitis of superficial vessels of left lower extremity: Secondary | ICD-10-CM | POA: Insufficient documentation

## 2014-06-11 DIAGNOSIS — Z7901 Long term (current) use of anticoagulants: Secondary | ICD-10-CM

## 2014-06-11 DIAGNOSIS — L52 Erythema nodosum: Secondary | ICD-10-CM | POA: Insufficient documentation

## 2014-06-11 LAB — POCT INR: INR: 1.1

## 2014-06-11 NOTE — Progress Notes (Signed)
   Subjective:    Patient ID: Cheryl Gordon, female    DOB: March 17, 1956, 59 y.o.   MRN: [id number]  HPI Patient returns for yet another protracted visit regarding her recent challenges. Please see ultrasound results. This revealed superficial thrombophlebitis with significant involvement of superficial saphenous vein.  Continues to report low-grade fever and tenderness with the other rash associated with erythema no dose some.  Just started the Coumadin last night. 5 mg daily. Has also resumed antibiotics as requested.  Patient arrives for recheck of left leg and discuss results of recent tests.   Review of Systems    no headache no chest pain no shortness of breath no abdominal pain no hemoptysis Objective:   Physical Exam Alert mild malaise vital stable HEENT normal lungs clear heart rare rhythm left heel ankle erythematous tender inflamed somewhat swollen other multiple discrete nodules both legs       Assessment & Plan:  Impression superficial thrombophlebitis with substantial involvement with external saphenous vein have elected to place patient on Coumadin. Rationale discussed. Proper use discussed. #2 erythema nodosum plan check INR again on next Friday. Finish current antibiotics. Recheck with me in 2 weeks. Vascular referral soon.

## 2014-06-11 NOTE — Patient Instructions (Signed)
Warfarin: What You Need to Know Warfarin is an anticoagulant. Anticoagulants help prevent the formation of blood clots. They also help stop the growth of blood clots. Warfarin is sometimes referred to as a "blood thinner."  Normally, when body tissues are cut or damaged, the blood clots in order to prevent blood loss. Sometimes clots form inside your blood vessels and obstruct the flow of blood through your circulatory system (thrombosis). These clots may travel through your bloodstream and become lodged in smaller blood vessels in your brain, which can cause a stroke, or in your lungs (pulmonary embolism). WHO SHOULD USE WARFARIN? Warfarin is prescribed for people at risk of developing harmful blood clots:  People with surgically implanted mechanical heart valves, irregular heart rhythms called atrial fibrillation, and certain clotting disorders.  People who have developed harmful blood clotting in the past, including those who have had a stroke or a pulmonary embolism, or thrombosis in their legs (deep vein thrombosis [DVT]).  People with an existing blood clot, such as a pulmonary embolism. WARFARIN DOSING Warfarin tablets come in different strengths. Each tablet strength is a different color, with the amount of warfarin (in milligrams) clearly printed on the tablet. If the color of your tablet is different than usual when you receive a new prescription, report it immediately to your pharmacist or health care provider. WARFARIN MONITORING The goal of warfarin therapy is to lessen the clotting tendency of blood but not prevent clotting completely. Your health care provider will monitor the anticoagulation effect of warfarin closely and adjust your dose as needed. For your safety, blood tests called prothrombin time (PT) or international normalized ratio (INR) are used to measure the effects of warfarin. Both of these tests can be done with a finger stick or a blood draw. The longer it takes the  blood to clot, the higher the PT or INR. Your health care provider will inform you of your "target" PT or INR range. If, at any time, your PT or INR is above the target range, there is a risk of bleeding. If your PT or INR is below the target range, there is a risk of clotting. Whether you are started on warfarin while you are in the hospital or in your health care provider's office, you will need to have your PT or INR checked within one week of starting the medicine. Initially, some people are asked to have their PT or INR checked as much as twice a week. Once you are on a stable maintenance dose, the PT or INR is checked less often, usually once every 2 to 4 weeks. The warfarin dose may be adjusted if the PT or INR is not within the target range. It is important to keep all laboratory and health care provider follow-up appointments. Not keeping appointments could result in a chronic or permanent injury, pain, or disability because warfarin is a medicine that requires close monitoring. WHAT ARE THE SIDE EFFECTS OF WARFARIN?  Too much warfarin can cause bleeding (hemorrhage) from any part of the body. This may include bleeding from the gums, blood in the urine, bloody or dark stools, a nosebleed that is not easily stopped, coughing up blood, or vomiting blood.  Too little warfarin can increase the risk of blood clots.  Too little or too much warfarin can also increase the risk of a stroke.  Warfarin use may cause a skin rash or irritation, an unusual fever, continual nausea or stomach upset, or severe pain in your joints or back.   SPECIAL PRECAUTIONS WHILE TAKING WARFARIN Warfarin should be taken exactly as directed. It is very important to take warfarin as directed since bleeding or blood clots could result in chronic or permanent injury, pain, or disability.  Take your medicine at the same time every day. If you forget to take your dose, you can take it if it is within 6 hours of when it was  due.  Do not change the dose of warfarin on your own to make up for missed or extra doses.  If you miss more than 2 doses in a row, you should contact your health care provider for advice. Avoid situations that cause bleeding. You may have a tendency to bleed more easily than usual while taking warfarin. The following actions can limit bleeding:  Using a softer toothbrush.  Flossing with waxed floss rather than unwaxed floss.  Shaving with an electric razor rather than a blade.  Limiting the use of sharp objects.  Avoiding potentially harmful activities, such as contact sports. Warfarin and Pregnancy or Breastfeeding  Warfarin is not advised during the first trimester of pregnancy due to an increased risk of birth defects. In certain situations, a woman may take warfarin after her first trimester of pregnancy. A woman who becomes pregnant or plans to become pregnant while taking warfarin should notify her health care provider immediately.  Although warfarin does not pass into breast milk, a woman who wishes to breastfeed while taking warfarin should also consult with her health care provider. Alcohol, Smoking, and Illicit Drug Use  Alcohol affects how warfarin works in the body. It is best to avoid alcoholic drinks or consume very small amounts while taking warfarin. In general, alcohol intake should be limited to 1 oz (30 mL) of liquor, 6 oz (180 mL) of wine, or 12 oz (360 mL) of beer each day. Notify your health care provider if you change your alcohol intake.  Smoking affects how warfarin works. It is best to avoid smoking while taking warfarin. Notify your health care provider if you change your smoking habits.  It is best to avoid all illicit drugs while taking warfarin since there are few studies that show how warfarin interacts with these drugs. Other Medicines and Dietary Supplements Many prescription and over-the-counter medicines can interfere with warfarin. Be sure all of your  health care providers know you are taking warfarin. Notify your health care provider who prescribed warfarin for you or your pharmacist before starting or stopping any new medicines, including over-the-counter vitamins, dietary supplements, and pain medicines. Your warfarin dose may need to be adjusted. Some common over-the-counter medicines that may increase the risk of bleeding while taking warfarin include:   Acetaminophen.  Aspirin.  Nonsteroidal anti-inflammatory medicines (NSAIDs), such as ibuprofen or naproxen.  Vitamin E. Dietary Considerations  Foods that have moderate or high amounts of vitamin K can interfere with warfarin. Avoid major changes in your diet or notify your health care provider before changing your diet. Eat a consistent amount of foods that have moderate or high amounts of vitamin K. Eating less foods containing vitamin K can increase the risk of bleeding. Eating more foods containing vitamin K can increase the risk of blood clots. Additional questions about dietary considerations can be discussed with a dietitian. Foods that are very high in vitamin K:  Greens, such as Swiss chard and beet, collard, mustard, or turnip greens (fresh or frozen, cooked).  Kale (fresh or frozen, cooked).  Parsley (raw).  Spinach (cooked). Foods that are high   in vitamin K:  Asparagus (frozen, cooked).  Beans, green (frozen, cooked).  Broccoli.  Bok choy (cooked).  Brussels sprouts (fresh or frozen, cooked).  Cabbage (cooked).   Coleslaw. Foods that are moderately high in vitamin K:  Blueberries.  Black-eyed peas.  Endive (raw).  Green leaf lettuce (raw).  Green scallions (raw).  Kale (raw).  Okra (frozen, cooked).  Plantains (fried).  Romaine lettuce (raw).  Sauerkraut (canned).  Spinach (raw). CALL YOUR CLINIC OR HEALTH CARE PROVIDER IF YOU:  Plan to have any surgery or procedure.  Feel sick, especially if you have diarrhea or  vomiting.  Experience or anticipate any major changes in your diet.  Start or stop a prescription or over-the-counter medicine.  Become, plan to become, or think you may be pregnant.  Are having heavier than usual menstrual periods.  Have had a fall, accident, or any symptoms of bleeding or unusual bruising.  Develop an unusual fever. CALL 911 IN THE [place] OR GO TO THE EMERGENCY DEPARTMENT IF YOU:   Think you may be having an allergic reaction to warfarin. The signs of an allergic reaction could include itching, rash, hives, swelling, chest tightness, or trouble breathing.  See signs of blood in your urine. The signs could include reddish, pinkish, or tea-colored urine.  See signs of blood in your stools. The signs could include bright red or black stools.  Vomit or cough up blood. In these instances, the blood could have either a bright red or a "coffee-grounds" appearance.  Have bleeding that will not stop after applying pressure for 30 minutes such as cuts, nosebleeds, or other injuries.  Have severe pain in your joints or back.  Have a new and severe headache.  Have sudden weakness or numbness of your face, arm, or leg, especially on one side of your body.  Have sudden confusion or trouble understanding.  Have sudden trouble seeing in one or both eyes.  Have sudden trouble walking, dizziness, loss of balance, or coordination.  Have trouble speaking or understanding (aphasia). Document Released: 05/22/2005 Document Revised: 10/06/2013 Document Reviewed: 11/15/2012 [employer] Patient Information 2015 [employer]. This information is not intended to replace advice given to you by your health care provider. Make sure you discuss any questions you have with your health care provider.  

## 2014-06-12 ENCOUNTER — Other Ambulatory Visit: Payer: Self-pay | Admitting: *Deleted

## 2014-06-12 ENCOUNTER — Encounter: Payer: Self-pay | Admitting: Vascular Surgery

## 2014-06-12 DIAGNOSIS — I8002 Phlebitis and thrombophlebitis of superficial vessels of left lower extremity: Secondary | ICD-10-CM

## 2014-06-15 ENCOUNTER — Ambulatory Visit (INDEPENDENT_AMBULATORY_CARE_PROVIDER_SITE_OTHER): Payer: BC Managed Care – PPO | Admitting: *Deleted

## 2014-06-15 ENCOUNTER — Ambulatory Visit (INDEPENDENT_AMBULATORY_CARE_PROVIDER_SITE_OTHER): Payer: BC Managed Care – PPO | Admitting: Vascular Surgery

## 2014-06-15 ENCOUNTER — Encounter: Payer: Self-pay | Admitting: Vascular Surgery

## 2014-06-15 ENCOUNTER — Ambulatory Visit (HOSPITAL_COMMUNITY)
Admission: RE | Admit: 2014-06-15 | Discharge: 2014-06-15 | Disposition: A | Discharge status: Home / Self Care | Payer: BC Managed Care – PPO | Source: Ambulatory Visit | Attending: Vascular Surgery | Admitting: Vascular Surgery

## 2014-06-15 VITALS — BP 124/83 | HR 105 | Resp 18 | Ht 63.0 in | Wt 150.0 lb

## 2014-06-15 DIAGNOSIS — I8002 Phlebitis and thrombophlebitis of superficial vessels of left lower extremity: Secondary | ICD-10-CM

## 2014-06-15 DIAGNOSIS — I803 Phlebitis and thrombophlebitis of lower extremities, unspecified: Secondary | ICD-10-CM | POA: Diagnosis not present

## 2014-06-15 DIAGNOSIS — I809 Phlebitis and thrombophlebitis of unspecified site: Secondary | ICD-10-CM | POA: Insufficient documentation

## 2014-06-15 DIAGNOSIS — Z7901 Long term (current) use of anticoagulants: Secondary | ICD-10-CM

## 2014-06-15 LAB — POCT INR: INR: 1.7

## 2014-06-15 NOTE — Progress Notes (Signed)
Subjective:     Patient ID: Cheryl Gordon, female   DOB: 03-28-1956, 59 y.o.   MRN: [id number]  HPI this 59 year old female is evaluated for thrombophlebitis left leg. She states that shortly before Christmas she developed pain and swelling in the medial aspect of her left lower leg near the ankle. This became quite tender. She shortly after that developed some nodules under the skin in her arm and left leg. She has no history of DVT, stasis ulcers, bleeding, or bulging varicosities. She tried elastic compression stockings but was noticing some swelling above the stocking and discontinued this. Subsequently she had a venous duplex exam at [facility] last week which revealed superficial thrombophlebitis in the left great saphenous vein adjacent to the ankle. She was started on Coumadin by Dr. Mickie Hillier which she will take for about 3 months. She was felt to have erythema nodosum causing the nodules. She denies any recent febrile illness, strep throat, history of connective tissue disease. She states that her left ankle is dramatically improved.  Past Medical History  Diagnosis Date  . Spina bifida   . Hypothyroid   . Chronic pain   . Migraine headache   . 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 Interstitial cystitis   . Hyperlipidemia   . Hypertension 2002    History  Substance Use Topics  . Smoking status: Current Some Day Smoker  . Smokeless tobacco: Not on file  . Alcohol Use: Not on file    Family History  Problem Relation Age of Onset  . Hypertension Father     Allergies  Allergen Reactions  . Sulfa Antibiotics   . Zithromax [Azithromycin]     Current outpatient prescriptions: buprenorphine (SUBUTEX) 8 MG SUBL SL tablet, Place under the tongue daily., Disp: , Rfl: ;  doxycycline (VIBRA-TABS) 100 MG tablet, Take 1 tablet (100 mg total) by mouth 2 (two) times daily., Disp: 20 tablet, Rfl: 0;  indapamide (LOZOL) 2.5 MG tablet, Take 1 tablet (2.5 mg total) by mouth daily.,  Disp: 30 tablet, Rfl: 5 ketoconazole (NIZORAL) 2 % cream, Apply 1 application topically 2 (two) times daily. Affected area, Disp: 15 g, Rfl: 0;  levothyroxine (SYNTHROID, LEVOTHROID) 125 MCG tablet, TAKE ONE (1) TABLET BY MOUTH EVERY DAY BRFORE BREAKFAST, Disp: 30 tablet, Rfl: 5;  promethazine (PHENERGAN) 25 MG tablet, TAKE ONE TABLET TWICE DAILY (Patient taking differently: TAKE ONE TABLET TWICE DAILY PRN), Disp: 28 tablet, Rfl: 6 ramipril (ALTACE) 10 MG capsule, TAKE ONE CAPSULE BY MOUTH DAILY, Disp: 30 capsule, Rfl: 5;  warfarin (COUMADIN) 5 MG tablet, Take 1 tablet (5 mg total) by mouth daily., Disp: 30 tablet, Rfl: 0  BP 124/83 mmHg  Pulse 105  Resp 18  Ht 5\' 3"  (1.6 m)  Wt 150 lb (68.04 kg)  BMI 26.58 kg/m2  Body mass index is 26.58 kg/(m^2).            Review of Systems denies chest pain, dyspnea on exertion, PND, orthopnea. Does have a history of spina bifida and has nerve root compression to her left leg treated by Dr. Kristeen Miss. Has had injections in the spinal area for this problem. Also has history of urinary infections. Other systems negative and a complete review of systems     Objective:   Physical Exam BP 124/83 mmHg  Pulse 105  Resp 18  Ht 5\' 3"  (1.6 m)  Wt 150 lb (68.04 kg)  BMI 26.58 kg/m2  Gen.-alert and oriented x3 in no apparent  distress HEENT normal for age Lungs no rhonchi or wheezing Cardiovascular regular rhythm no murmurs carotid pulses 3+ palpable no bruits audible Abdomen soft nontender no palpable masses Musculoskeletal free of  major deformities Skin clear -no rashes Neurologic normal Lower extremities 3+ femoral and dorsalis pedis pulses palpable bilaterally with no edema in the right leg 1+ edema left leg mid calf to foot. Left leg with inflammation and tenderness over great saphenous vein just proximal to medial malleolus over about 7 cm in diameter area. There is some early hyperpigmentation and erythema in this area. No bulging  varicosities noted. There are a few small spider veins and anterior thigh and pretibial region.  Today I ordered a venous duplex exam the left leg which I reviewed and interpreted. Also obtain the results from the previous venous ultrasound performed on 06/09/2014. Our study reveals no evidence of DVT but some superficial thrombophlebitis in the distal thigh. The study performed at [facility] revealed localized thrombophlebitis in the left great saphenous vein distally.       Assessment:     #1 resolving thrombophlebitis left great saphenous vein with no evidence of gross reflux in left great saphenous vein proximally and no DVT #2 diagnosis recently made of erythema nodosum- #3 patient currently on Coumadin therapy for #11 #4 history of spina bifida with nerve root compression to left leg    Plan:     Have recommended short leg elastic compression stockings, elevation left leg at night, moist heat compresses if necessary. Patient is on Coumadin and I do not think this needs to be continued longer than 2-3 months and then she should be on daily aspirin therapy We'll see again as necessary

## 2014-06-18 ENCOUNTER — Ambulatory Visit (INDEPENDENT_AMBULATORY_CARE_PROVIDER_SITE_OTHER): Payer: BC Managed Care – PPO | Admitting: *Deleted

## 2014-06-18 DIAGNOSIS — Z79899 Other long term (current) drug therapy: Secondary | ICD-10-CM

## 2014-06-18 LAB — POCT INR: INR: 1.7

## 2014-06-24 ENCOUNTER — Ambulatory Visit (INDEPENDENT_AMBULATORY_CARE_PROVIDER_SITE_OTHER): Payer: BC Managed Care – PPO | Admitting: Family Medicine

## 2014-06-24 ENCOUNTER — Encounter: Payer: Self-pay | Admitting: Family Medicine

## 2014-06-24 VITALS — BP 120/88 | Ht 63.0 in | Wt 154.0 lb

## 2014-06-24 DIAGNOSIS — I8002 Phlebitis and thrombophlebitis of superficial vessels of left lower extremity: Secondary | ICD-10-CM

## 2014-06-24 DIAGNOSIS — L52 Erythema nodosum: Secondary | ICD-10-CM

## 2014-06-24 DIAGNOSIS — Z7901 Long term (current) use of anticoagulants: Secondary | ICD-10-CM

## 2014-06-24 LAB — POCT INR: INR: 4.4

## 2014-06-24 NOTE — Patient Instructions (Addendum)
No coumadin today, one half tablet tomorrow,   Then one tab fri, one tab sat, one half sun, one mon, ck blood on tue

## 2014-06-24 NOTE — Progress Notes (Signed)
   Subjective:    Patient ID: Cheryl Gordon, female    DOB: [birth year], 59 y.o.   MRN: [id number]  HPI Patient is here today for a follow up visit for the swelling in her legs. Patient states that the swelling has resolved and they feel much better now. Patient states that today she has not been feeling very good. She has been very emotional and having moments where she breaks down and starts crying. Patient wonders if her emotional challenges today are related to the access blood thinning.   INR today is 4.4. Form is on front of folder  Patient also has a protracted discussion regarding her visit with the vascular doctor. Multiple questions answered. Vascular doctors note reviewed in front of patient.  Patient reports complete compliance with Coumadin. No obvious side effects. No excess bleeding.  Patient reports erythema nodosum is slowly improving.  Patient reports a superficial thrombophlebitis is also improving..    Review of Systems No headache no chest pain no back pain abdominal pain no shortness breath no fever ROS otherwise negative    Objective:   Physical Exam  Alert no apparent distress. HEENT normal. Lungs clear. Heart regular in rhythm. Left anterior medial distal leg less erythema less tenderness less swelling. Nodular lesion secondary to erythema note though some also improved.    Assessment & Plan:  Impression 1 superficial thrombophlebitis discussed. Up-to-date consult and in presence of patient. It definitely recommend months worth of anticoagulation for complicates superficial thrombophlebitis. #2 erythema nodosum discussed #3 Coumadin use discussed plan custom Coumadin prescription written. Check INR again next Tuesday. Symptomatic care discussed 25 minutes spent most in discussion. WSL

## 2014-07-01 ENCOUNTER — Ambulatory Visit: Payer: BC Managed Care – PPO | Admitting: Family Medicine

## 2014-07-01 ENCOUNTER — Ambulatory Visit (INDEPENDENT_AMBULATORY_CARE_PROVIDER_SITE_OTHER): Payer: BC Managed Care – PPO | Admitting: *Deleted

## 2014-07-01 DIAGNOSIS — Z7901 Long term (current) use of anticoagulants: Secondary | ICD-10-CM

## 2014-07-01 LAB — POCT INR: INR: 1.2

## 2014-07-01 NOTE — Patient Instructions (Signed)
Take 1.5 tabs today. One tab all other days. Recheck next Tuesday.

## 2014-07-07 ENCOUNTER — Ambulatory Visit (INDEPENDENT_AMBULATORY_CARE_PROVIDER_SITE_OTHER): Payer: BC Managed Care – PPO | Admitting: *Deleted

## 2014-07-07 DIAGNOSIS — Z7901 Long term (current) use of anticoagulants: Secondary | ICD-10-CM

## 2014-07-07 LAB — POCT INR: INR: 1.8

## 2014-07-07 NOTE — Patient Instructions (Signed)
Take 1.5 tabs Wed. One tab all other days. Take the last dose Friday evening. Then stop!

## 2014-07-09 ENCOUNTER — Other Ambulatory Visit: Payer: Self-pay | Admitting: Family Medicine

## 2014-07-09 NOTE — Telephone Encounter (Signed)
Please advise patient that it may interfere briefly with Coumadin but should not be a major issue since she is only taking 2 doses at most.

## 2014-07-13 ENCOUNTER — Other Ambulatory Visit: Payer: Self-pay | Admitting: Family Medicine

## 2014-07-14 ENCOUNTER — Encounter: Payer: Self-pay | Admitting: Family Medicine

## 2014-07-14 ENCOUNTER — Ambulatory Visit (INDEPENDENT_AMBULATORY_CARE_PROVIDER_SITE_OTHER): Payer: BC Managed Care – PPO | Admitting: Family Medicine

## 2014-07-14 VITALS — BP 122/82 | Ht 63.0 in | Wt 156.5 lb

## 2014-07-14 DIAGNOSIS — I8002 Phlebitis and thrombophlebitis of superficial vessels of left lower extremity: Secondary | ICD-10-CM

## 2014-07-14 NOTE — Progress Notes (Signed)
   Subjective:    Patient ID: Cheryl Gordon, female    DOB: 07-Jan-1956, 58 y.o.   MRN: [id number]  Leg Pain  The incident occurred more than 1 week ago. The incident occurred at home. There was no injury mechanism. The pain is present in the left leg. Quality: soreness. The patient is experiencing no pain. The pain has been improving since onset. She reports no foreign bodies present. Nothing aggravates the symptoms. She has tried nothing for the symptoms. The treatment provided no relief.   Patient states that she has no other concerns.   Has finish Coumadin. Still some residual tenderness at sites of superficial thrombophlebitis.  Erythema nodosum has calm down Review of Systems No headache no chest pain no shortness breath no abdominal pain    Objective:   Physical Exam Alert no apparent distress HEENT normal. Lungs clear heart rare rhythm left medial ankle slight erythema slight tenderness no swelling       Assessment & Plan:  Impression superficial thrombophlebitis much improved discussed length #2 erythema nodosum discussed #3 venous stasis discussed plan Aleve 2 tablets twice a day when necessary. No further intervention at this time. 15 minutes spent most in discussion. WSL

## 2014-08-04 ENCOUNTER — Encounter: Payer: Self-pay | Admitting: Family Medicine

## 2014-08-04 ENCOUNTER — Ambulatory Visit (INDEPENDENT_AMBULATORY_CARE_PROVIDER_SITE_OTHER): Payer: BC Managed Care – PPO | Admitting: Family Medicine

## 2014-08-04 VITALS — BP 122/82 | Temp 99.0°F | Ht 63.0 in | Wt 157.8 lb

## 2014-08-04 DIAGNOSIS — J111 Influenza due to unidentified influenza virus with other respiratory manifestations: Secondary | ICD-10-CM

## 2014-08-04 DIAGNOSIS — J1189 Influenza due to unidentified influenza virus with other manifestations: Secondary | ICD-10-CM | POA: Diagnosis not present

## 2014-08-04 MED ORDER — OSELTAMIVIR PHOSPHATE 75 MG PO CAPS: 75.0000 mg | ORAL_CAPSULE | Freq: Two times a day (BID) | ORAL | Status: DC

## 2014-08-04 NOTE — Progress Notes (Signed)
   Subjective:    Patient ID: Cheryl Gordon, female    DOB: Apr 01, 1956, 59 y.o.   MRN: [id number]  Emesis  This is a new problem. The current episode started yesterday. Associated symptoms include arthralgias, coughing and a fever. Treatments tried: ibuprofen.    Got achey last night  Felt real bad aching all ove  vom times one  Felt warm, temp 99.7  Flu symptoms  Not as bad,  No vom this morn   Review of Systems  Constitutional: Positive for fever.  Respiratory: Positive for cough.   Gastrointestinal: Positive for vomiting.  Musculoskeletal: Positive for arthralgias.       Objective:   Physical Exam  Alert moderate malaise. Vitals stable HET moderate nasal congestion lungs bronchial cough trace wheeze no tachypnea heart regular rate and rhythm      Assessment & Plan:  Impression flu discussed plan Tamiflu twice a day 5 days symptom care discussed hydration discussed warning signs discussed albuterol when necessary. WSL

## 2014-09-01 ENCOUNTER — Other Ambulatory Visit: Payer: Self-pay | Admitting: Gynecology

## 2014-09-03 LAB — CYTOLOGY - PAP

## 2014-09-10 ENCOUNTER — Other Ambulatory Visit: Payer: Self-pay | Admitting: Family Medicine

## 2014-09-28 ENCOUNTER — Other Ambulatory Visit: Payer: Self-pay | Admitting: Family Medicine

## 2014-10-13 ENCOUNTER — Other Ambulatory Visit: Payer: Self-pay | Admitting: Family Medicine

## 2014-11-09 ENCOUNTER — Other Ambulatory Visit: Payer: Self-pay | Admitting: Family Medicine

## 2014-11-09 NOTE — Telephone Encounter (Signed)
Needs office visit.

## 2014-11-10 ENCOUNTER — Other Ambulatory Visit: Payer: Self-pay | Admitting: Nurse Practitioner

## 2014-11-30 ENCOUNTER — Other Ambulatory Visit: Payer: Self-pay | Admitting: Family Medicine

## 2014-12-14 ENCOUNTER — Other Ambulatory Visit: Payer: Self-pay | Admitting: Family Medicine

## 2015-01-08 ENCOUNTER — Telehealth: Payer: Self-pay | Admitting: Family Medicine

## 2015-01-08 MED ORDER — LEVOTHYROXINE SODIUM 125 MCG PO TABS: ORAL_TABLET | ORAL | Status: DC

## 2015-01-08 MED ORDER — INDAPAMIDE 2.5 MG PO TABS
ORAL_TABLET | ORAL | Status: DC
Start: 2015-01-08 — End: 2015-01-28

## 2015-01-08 MED ORDER — RAMIPRIL 10 MG PO CAPS: ORAL_CAPSULE | ORAL | Status: DC

## 2015-01-08 NOTE — Telephone Encounter (Signed)
Pt is needing refills on her ramapril, and her thyroid meds and her fluid pill. Pt has an appt for 8/25   [facility] pharmacy

## 2015-01-08 NOTE — Telephone Encounter (Signed)
Notified patient that 1 refill was sent to pharmacy.

## 2015-01-28 ENCOUNTER — Encounter: Payer: Self-pay | Admitting: Family Medicine

## 2015-01-28 ENCOUNTER — Ambulatory Visit (INDEPENDENT_AMBULATORY_CARE_PROVIDER_SITE_OTHER): Payer: BC Managed Care – PPO | Admitting: Family Medicine

## 2015-01-28 VITALS — BP 128/82 | Ht 63.0 in | Wt 149.2 lb

## 2015-01-28 DIAGNOSIS — E039 Hypothyroidism, unspecified: Secondary | ICD-10-CM

## 2015-01-28 DIAGNOSIS — Z79899 Other long term (current) drug therapy: Secondary | ICD-10-CM | POA: Diagnosis not present

## 2015-01-28 DIAGNOSIS — E785 Hyperlipidemia, unspecified: Secondary | ICD-10-CM | POA: Diagnosis not present

## 2015-01-28 MED ORDER — RAMIPRIL 10 MG PO CAPS: ORAL_CAPSULE | ORAL | Status: DC

## 2015-01-28 NOTE — Progress Notes (Signed)
   Subjective:    Patient ID: Cheryl Gordon, female    DOB: March 16, 1956, 59 y.o.   MRN: [id number] Patient arrives office with multiple concerns.   Hypertension This is a chronic problem. The current episode started more than 1 year ago. Risk factors for coronary artery disease include post-menopausal state. Treatments tried: altace, lozol.    Staying active physically  Still some challenges with walking on the back  Dancing ok   Overall back and leg is stable, no major flare fr the shot since then  Chol up at times, has lost weight. Watching diet and oing beter  Patient on thyroid medication. Some fatigue at times. Claims compliance with medication. No symptoms of high or low thyroid.  Still exercising fairly regularly. Next  History of elevated cholesterol. Has cut down fats in diet.  Wonders if she can stop the diarrhetic. Blood pressures tend to run low     Review of Systems    no headache no chest pain no back pain no abdominal pain no change in bowel habits Objective:   Physical Exam  Alert vitals stable. HEENT normal. Lungs clear. Heart regular rate and rhythm. Ankles trace edema. Thyroid nonpalpable. Blood pressure 114/76 on repeat      Assessment & Plan:  Impression 1 hypertension good control and on the tight side. Patient would prefer to come off diarrhetic #2 hypothyroidism status uncertain. Discussed #3 hyperlipidemia status uncertain #4 chronic occasional nausea stable plan appropriate blood work. Medications refilled. Diet exercise discussed WSL

## 2015-02-12 LAB — LIPID PANEL
Chol/HDL Ratio: 7.1 ratio units — ABNORMAL HIGH (ref 0.0–4.4)
Cholesterol, Total: 206 mg/dL — ABNORMAL HIGH (ref 100–199)
HDL: 29 mg/dL — ABNORMAL LOW (ref >39)
LDL Calculated: 131 mg/dL — ABNORMAL HIGH (ref 0–99)
Triglycerides: 228 mg/dL — ABNORMAL HIGH (ref 0–149)
VLDL Cholesterol Cal: 46 mg/dL — ABNORMAL HIGH (ref 5–40)

## 2015-02-12 LAB — T4, FREE: Free T4: 1.45 ng/dL (ref 0.82–1.77)

## 2015-02-12 LAB — HEPATIC FUNCTION PANEL
ALT: 16 IU/L (ref 0–32)
AST: 13 IU/L (ref 0–40)
Albumin: 4.3 g/dL (ref 3.6–4.8)
Alkaline Phosphatase: 92 IU/L (ref 39–117)
Bilirubin Total: 0.3 mg/dL (ref 0.0–1.2)
Bilirubin, Direct: 0.09 mg/dL (ref 0.00–0.40)
Total Protein: 6.8 g/dL (ref 6.0–8.5)

## 2015-02-12 LAB — BASIC METABOLIC PANEL
BUN/Creatinine Ratio: 17 (ref 11–26)
BUN: 17 mg/dL (ref 8–27)
CO2: 29 mmol/L (ref 18–29)
Calcium: 10.5 mg/dL — ABNORMAL HIGH (ref 8.7–10.3)
Chloride: 97 mmol/L (ref 97–108)
Creatinine, Ser: 1.01 mg/dL — ABNORMAL HIGH (ref 0.57–1.00)
GFR calc Af Amer: 70 mL/min/{1.73_m2} (ref >59)
GFR calc non Af Amer: 61 mL/min/{1.73_m2} (ref >59)
Glucose: 107 mg/dL — ABNORMAL HIGH (ref 65–99)
Potassium: 4.7 mmol/L (ref 3.5–5.2)
Sodium: 141 mmol/L (ref 134–144)

## 2015-02-12 LAB — TSH: TSH: 0.521 u[IU]/mL (ref 0.450–4.500)

## 2015-02-17 ENCOUNTER — Other Ambulatory Visit: Payer: Self-pay | Admitting: Family Medicine

## 2015-02-18 ENCOUNTER — Encounter: Payer: Self-pay | Admitting: Family Medicine

## 2015-03-04 ENCOUNTER — Other Ambulatory Visit: Payer: Self-pay | Admitting: Family Medicine

## 2015-03-04 NOTE — Telephone Encounter (Signed)
Not on med list

## 2015-03-15 ENCOUNTER — Other Ambulatory Visit: Payer: Self-pay | Admitting: Family Medicine

## 2015-03-23 ENCOUNTER — Other Ambulatory Visit: Payer: Self-pay | Admitting: Family Medicine

## 2015-04-07 ENCOUNTER — Encounter: Payer: Self-pay | Admitting: Family Medicine

## 2015-04-07 ENCOUNTER — Ambulatory Visit (INDEPENDENT_AMBULATORY_CARE_PROVIDER_SITE_OTHER): Payer: BC Managed Care – PPO | Admitting: Family Medicine

## 2015-04-07 VITALS — BP 102/70 | Temp 98.5°F | Ht 63.0 in | Wt 148.4 lb

## 2015-04-07 DIAGNOSIS — N390 Urinary tract infection, site not specified: Secondary | ICD-10-CM | POA: Diagnosis not present

## 2015-04-07 DIAGNOSIS — R300 Dysuria: Secondary | ICD-10-CM

## 2015-04-07 LAB — POCT URINALYSIS DIPSTICK
Blood, UA: 10
Spec Grav, UA: >=1.03
pH, UA: 5

## 2015-04-07 MED ORDER — CIPROFLOXACIN HCL 250 MG PO TABS: 250.0000 mg | ORAL_TABLET | Freq: Two times a day (BID) | ORAL | Status: DC

## 2015-04-07 NOTE — Progress Notes (Signed)
   Subjective:    Patient ID: Cheryl Gordon, female    DOB: Dec 19, 1955, 59 y.o.   MRN: [id number]  Dysuria  This is a new problem. The current episode started in the past 7 days. The problem occurs intermittently. The problem has been unchanged. The quality of the pain is described as burning. The pain is moderate. There has been no fever. She has tried nothing for the symptoms. The treatment provided no relief.  Otalgia  There is pain in the left ear. This is a new problem. The current episode started yesterday. The problem has been unchanged. There has been no fever. The pain is moderate. She has tried nothing for the symptoms. The treatment provided no relief.   Patient has no other concerns at this time.   incr freq and dysuria, helped the discomfort, late yest eve ans   Long time since last bladder infxn,  Sig dysuriano fever nmnow retired Review of Systems  HENT: Positive for ear pain.   Genitourinary: Positive for dysuria.       Objective:   Physical Exam   alert vitals stable HEENT normal lungs clear heart rare rhythm no CA tenderness left ear normal scalp somewhat hypersensitive   urinalysis numerous white blood cells    Assessment & Plan:   impression 1 urinary tract infection #2 neuropathic headaches discussed plan antibiotic prescribed. Symptom care discussed warning signs discussed WSL

## 2015-05-07 ENCOUNTER — Encounter: Payer: Self-pay | Admitting: Nurse Practitioner

## 2015-05-07 ENCOUNTER — Ambulatory Visit (INDEPENDENT_AMBULATORY_CARE_PROVIDER_SITE_OTHER): Payer: BC Managed Care – PPO | Admitting: Nurse Practitioner

## 2015-05-07 ENCOUNTER — Other Ambulatory Visit: Payer: Self-pay | Admitting: Family Medicine

## 2015-05-07 VITALS — BP 130/88 | Temp 98.2°F | Ht 63.0 in | Wt 145.0 lb

## 2015-05-07 DIAGNOSIS — R309 Painful micturition, unspecified: Secondary | ICD-10-CM

## 2015-05-07 DIAGNOSIS — N390 Urinary tract infection, site not specified: Secondary | ICD-10-CM | POA: Diagnosis not present

## 2015-05-07 DIAGNOSIS — R319 Hematuria, unspecified: Secondary | ICD-10-CM | POA: Diagnosis not present

## 2015-05-07 LAB — POCT UA - MICROSCOPIC ONLY
Bacteria, U Microscopic: POSITIVE
Epithelial cells, urine per micros: NEGATIVE

## 2015-05-07 LAB — POCT URINALYSIS DIPSTICK
Spec Grav, UA: 1.02
pH, UA: 5

## 2015-05-07 MED ORDER — FLUCONAZOLE 100 MG PO TABS: ORAL_TABLET | ORAL | Status: DC

## 2015-05-07 MED ORDER — CEFDINIR 300 MG PO CAPS: 300.0000 mg | ORAL_CAPSULE | Freq: Two times a day (BID) | ORAL | Status: DC

## 2015-05-07 NOTE — Patient Instructions (Signed)
acidophilus

## 2015-05-09 ENCOUNTER — Encounter: Payer: Self-pay | Admitting: Nurse Practitioner

## 2015-05-09 LAB — URINE CULTURE

## 2015-05-09 NOTE — Progress Notes (Signed)
Subjective:  Presents for c/o urinary symptoms for the past couple of hours. Dysuria with urinary frequency and urgency. No fever. No N/V. Bowels normal. No vaginal discharge. Married, same sexual partner. No abdominal or pelvic pain. Took AZO which helped.   Objective:   BP 130/88 mmHg  Temp(Src) 98.2 F (36.8 C) (Oral)  Ht 5\' 3"  (1.6 m)  Wt 145 lb (65.772 kg)  BMI 25.69 kg/m2 NAD. Alert, oriented. Lungs clear. Heart RRR. No CVA or flank tenderness. Abdomen soft, non tender.  Results for orders placed or performed in visit on 05/07/15                       POCT urinalysis dipstick  Result Value Ref Range   Color, UA Yellow    Clarity, UA Cloudy    Glucose, UA     Bilirubin, UA     Ketones, UA     Spec Grav, UA 1.020    Blood, UA Moderate    pH, UA 5.0    Protein, UA     Urobilinogen, UA     Nitrite, UA     Leukocytes, UA moderate (2+) (A) Negative  POCT UA - Microscopic Only  Result Value Ref Range   WBC, Ur, HPF, POC TNTC    RBC, urine, microscopic TNTC    Bacteria, U Microscopic pos    Mucus, UA     Epithelial cells, urine per micros neg    Crystals, Ur, HPF, POC     Casts, Ur, LPF, POC     Yeast, UA       Assessment: Urinary tract infection with hematuria, site unspecified - Plan: Urine Culture, POCT UA - Microscopic Only  Painful urination - Plan: POCT urinalysis dipstick, Urine Culture  Plan:  Meds ordered this encounter  Medications  . cefdinir (OMNICEF) 300 MG capsule    Sig: Take 1 capsule (300 mg total) by mouth 2 (two) times daily.    Dispense:  14 capsule    Refill:  0    Order Specific Question:  Supervising Provider    Answer:  Mikey Kirschner [[id number]]  . fluconazole (DIFLUCAN) 100 MG tablet    Sig: 2 po the first day then one po qd x 14 days for thrush    Dispense:  16 tablet    Refill:  0    Order Specific Question:  Supervising Provider    Answer:  Mikey Kirschner [[id number]]   Take AZO for 48 hours then DC. Warning signs reviewed. Call  back in 72 hours if no improvement, sooner if worse.

## 2015-05-18 ENCOUNTER — Encounter: Payer: Self-pay | Admitting: Family Medicine

## 2015-05-18 ENCOUNTER — Ambulatory Visit (INDEPENDENT_AMBULATORY_CARE_PROVIDER_SITE_OTHER): Payer: BC Managed Care – PPO | Admitting: Family Medicine

## 2015-05-18 VITALS — BP 118/72 | Temp 98.9°F | Ht 63.0 in | Wt 145.2 lb

## 2015-05-18 DIAGNOSIS — M25511 Pain in right shoulder: Secondary | ICD-10-CM

## 2015-05-18 MED ORDER — CHLORZOXAZONE 500 MG PO TABS: 500.0000 mg | ORAL_TABLET | Freq: Three times a day (TID) | ORAL | Status: DC | PRN

## 2015-05-18 MED ORDER — ETODOLAC 400 MG PO TABS: 400.0000 mg | ORAL_TABLET | Freq: Two times a day (BID) | ORAL | Status: DC

## 2015-05-18 NOTE — Progress Notes (Signed)
   Subjective:    Patient ID: Cheryl Gordon, female    DOB: 09-11-1955, 59 y.o.   MRN: [id number]  HPI    Review of Systems     Objective:   Physical Exam        Assessment & Plan:

## 2015-05-18 NOTE — Progress Notes (Signed)
   Subjective:    Patient ID: Cheryl Gordon, female    DOB: [birth year], 59 y.o.   MRN: [id number]  HPI  Sat eve pain and discomfort  Hurting more often but not all the time  Painful to hold bowl or anything  All day aching bad , ibu not helping  Using ice locally and aspercream not helping much  Now not wking  Ibuprofen prn , aspercream,   Pain primarily periscapular right side had done some extra lifting since has been unable to lift heavy objects currently  Review of Systems No headache no chest pain no back pain other than noted no change in bowel habits    Objective:   Physical Exam Alert vitals stable mild malaise. Lungs clear heart rare rhythm right arm strength intact positive right. Scapular tenderness deep palpation       Assessment & Plan:  Impression rhomboid strain discussed plan local measures discussed anti-inflammatory medication prescribed. Symptom care discussed patient wishes to avoid narcotics which I agree with [facility]

## 2015-05-21 ENCOUNTER — Telehealth: Payer: Self-pay | Admitting: Family Medicine

## 2015-05-21 MED ORDER — TRAMADOL HCL 50 MG PO TABS: ORAL_TABLET | ORAL | Status: DC

## 2015-05-21 NOTE — Telephone Encounter (Signed)
Called and informed patient per Dr.Steve Luking- Tramadol 50 mg 1 tablet every 6 hours was sent into requested pharmacy. Patient verbalized understanding.

## 2015-05-21 NOTE — Telephone Encounter (Signed)
Patient needs something stronger called in for shoulder pain .She was seen 12/13 and states the pain is worst. And wants something called into [facility].

## 2015-05-21 NOTE — Telephone Encounter (Signed)
Tramadol 50 one q six hr prn 24

## 2015-05-24 ENCOUNTER — Encounter: Payer: Self-pay | Admitting: Family Medicine

## 2015-05-24 ENCOUNTER — Ambulatory Visit (INDEPENDENT_AMBULATORY_CARE_PROVIDER_SITE_OTHER): Payer: BC Managed Care – PPO | Admitting: Family Medicine

## 2015-05-24 VITALS — BP 138/80 | Ht 62.0 in | Wt 146.0 lb

## 2015-05-24 DIAGNOSIS — M25511 Pain in right shoulder: Secondary | ICD-10-CM | POA: Diagnosis not present

## 2015-05-24 DIAGNOSIS — M5412 Radiculopathy, cervical region: Secondary | ICD-10-CM | POA: Diagnosis not present

## 2015-05-24 MED ORDER — PREDNISONE 20 MG PO TABS: ORAL_TABLET | ORAL | Status: DC

## 2015-05-24 MED ORDER — METHYLPREDNISOLONE ACETATE 40 MG/ML IJ SUSP
40.0000 mg | Freq: Once | INTRAMUSCULAR | Status: AC
  Administered 2015-05-24: 40 mg via INTRAMUSCULAR

## 2015-05-24 NOTE — Progress Notes (Signed)
   Subjective:    Patient ID: Cheryl Gordon, female    DOB: 04/29/1956, 59 y.o.   MRN: [id number]  Shoulder Pain  Pain location: right shoulder, right arm. This is a new problem. Episode onset: over one week ago. Associated symptoms include numbness. Treatments tried: anti inflammatory, muscle relaxer, heat and ice. The treatment provided no relief.   Wrist nd hand pain and weekness  Pain rad all th the way  Patient arrives office for an extremely protracted discussion. On Suboxone. Recently this was increased to 3 times a day by her psychiatrist who worked with her for medication addiction. This is not sufficiently managing her pain.  Deep right shoulder ache radiates all at the hand. Diminished sensation and aching and pain in the ulnar distribution hand. Extends the shoulder always and neck. Next  No headache no neck injury. Review of Systems  Neurological: Positive for numbness.       Objective:   Physical Exam  Alert vitals stable somewhat tearful anxious neck supple positive right arm pain with lateral motion neck shoulder good range of motion distal hand on her most culture question weakness and diminished sensation on her distribution elbow good range of motion      Assessment & Plan:  Impression probable neuropathic pain right arm. #2 Suboxone chronic use with reluctance on patient intake pain meds. Tearful about the need to take pain medications. We try to call patient psychiatrist he was unable to return our call. Plan add tramadol when necessary. Prednisone taper. Steroid shot. If persists will need MRI WSL easily 25 minutes spent most in discussion

## 2015-06-02 ENCOUNTER — Other Ambulatory Visit: Payer: Self-pay | Admitting: *Deleted

## 2015-06-02 ENCOUNTER — Telehealth: Payer: Self-pay | Admitting: Family Medicine

## 2015-06-02 ENCOUNTER — Other Ambulatory Visit: Payer: Self-pay | Admitting: Family Medicine

## 2015-06-02 MED ORDER — PREDNISONE 20 MG PO TABS: ORAL_TABLET | ORAL | Status: DC

## 2015-06-02 NOTE — Telephone Encounter (Signed)
Med sent to pharm. Pt notified.  

## 2015-06-02 NOTE — Telephone Encounter (Signed)
Pt wanting a refill on the prednisone for her shoulder due to being  Out now or as of today an the pain is coming back. She is trying to  Get in with her back Dr as soon as she can to have him deal with  This. They just found out today that they are leaning toward [facility] having  [facility], there's a lot going on an she is stressed.   Please advise   [facility]

## 2015-06-02 NOTE — Telephone Encounter (Signed)
Ok adult pred taper 

## 2015-06-08 ENCOUNTER — Other Ambulatory Visit: Payer: Self-pay | Admitting: Nurse Practitioner

## 2015-08-17 ENCOUNTER — Emergency Department (HOSPITAL_COMMUNITY): Payer: BC Managed Care – PPO

## 2015-08-17 ENCOUNTER — Inpatient Hospital Stay (HOSPITAL_COMMUNITY)
Admission: EM | Admit: 2015-08-17 | Discharge: 2015-08-20 | DRG: 684 | Disposition: A | Discharge status: Home / Self Care | Payer: BC Managed Care – PPO | Attending: Internal Medicine | Admitting: Internal Medicine

## 2015-08-17 ENCOUNTER — Encounter (HOSPITAL_COMMUNITY): Payer: Self-pay | Admitting: *Deleted

## 2015-08-17 DIAGNOSIS — R42 Dizziness and giddiness: Secondary | ICD-10-CM

## 2015-08-17 DIAGNOSIS — G8929 Other chronic pain: Secondary | ICD-10-CM | POA: Diagnosis present

## 2015-08-17 DIAGNOSIS — N179 Acute kidney failure, unspecified: Secondary | ICD-10-CM | POA: Diagnosis present

## 2015-08-17 DIAGNOSIS — R112 Nausea with vomiting, unspecified: Secondary | ICD-10-CM | POA: Diagnosis present

## 2015-08-17 DIAGNOSIS — Q059 Spina bifida, unspecified: Secondary | ICD-10-CM | POA: Diagnosis not present

## 2015-08-17 DIAGNOSIS — Z8249 Family history of ischemic heart disease and other diseases of the circulatory system: Secondary | ICD-10-CM | POA: Diagnosis not present

## 2015-08-17 DIAGNOSIS — R110 Nausea: Secondary | ICD-10-CM | POA: Diagnosis not present

## 2015-08-17 DIAGNOSIS — F17200 Nicotine dependence, unspecified, uncomplicated: Secondary | ICD-10-CM | POA: Diagnosis present

## 2015-08-17 DIAGNOSIS — E039 Hypothyroidism, unspecified: Secondary | ICD-10-CM | POA: Diagnosis present

## 2015-08-17 DIAGNOSIS — N189 Chronic kidney disease, unspecified: Secondary | ICD-10-CM | POA: Diagnosis present

## 2015-08-17 DIAGNOSIS — E876 Hypokalemia: Secondary | ICD-10-CM | POA: Diagnosis present

## 2015-08-17 DIAGNOSIS — M549 Dorsalgia, unspecified: Secondary | ICD-10-CM | POA: Diagnosis present

## 2015-08-17 DIAGNOSIS — E860 Dehydration: Secondary | ICD-10-CM | POA: Diagnosis present

## 2015-08-17 DIAGNOSIS — R6889 Other general symptoms and signs: Secondary | ICD-10-CM | POA: Insufficient documentation

## 2015-08-17 DIAGNOSIS — B349 Viral infection, unspecified: Secondary | ICD-10-CM | POA: Diagnosis present

## 2015-08-17 DIAGNOSIS — N12 Tubulo-interstitial nephritis, not specified as acute or chronic: Secondary | ICD-10-CM | POA: Diagnosis present

## 2015-08-17 DIAGNOSIS — E869 Volume depletion, unspecified: Secondary | ICD-10-CM | POA: Diagnosis present

## 2015-08-17 DIAGNOSIS — E785 Hyperlipidemia, unspecified: Secondary | ICD-10-CM | POA: Diagnosis present

## 2015-08-17 DIAGNOSIS — I129 Hypertensive chronic kidney disease with stage 1 through stage 4 chronic kidney disease, or unspecified chronic kidney disease: Secondary | ICD-10-CM | POA: Diagnosis present

## 2015-08-17 LAB — HEPATIC FUNCTION PANEL
ALT: 17 U/L (ref 14–54)
AST: 19 U/L (ref 15–41)
Albumin: 3.3 g/dL — ABNORMAL LOW (ref 3.5–5.0)
Alkaline Phosphatase: 98 U/L (ref 38–126)
Bilirubin, Direct: 0.3 mg/dL (ref 0.1–0.5)
Indirect Bilirubin: 0.3 mg/dL (ref 0.3–0.9)
Total Bilirubin: 0.6 mg/dL (ref 0.3–1.2)
Total Protein: 7.3 g/dL (ref 6.5–8.1)

## 2015-08-17 LAB — CBC WITH DIFFERENTIAL/PLATELET
Basophils Absolute: 0 10*3/uL (ref 0.0–0.1)
Basophils Relative: 0 %
Eosinophils Absolute: 0.1 10*3/uL (ref 0.0–0.7)
Eosinophils Relative: 1 %
HCT: 31.6 % — ABNORMAL LOW (ref 36.0–46.0)
Hemoglobin: 10.8 g/dL — ABNORMAL LOW (ref 12.0–15.0)
Lymphocytes Relative: 3 %
Lymphs Abs: 0.2 10*3/uL — ABNORMAL LOW (ref 0.7–4.0)
MCH: 30 pg (ref 26.0–34.0)
MCHC: 34.2 g/dL (ref 30.0–36.0)
MCV: 87.8 fL (ref 78.0–100.0)
Monocytes Absolute: 0.7 10*3/uL (ref 0.1–1.0)
Monocytes Relative: 10 %
Neutro Abs: 5.6 10*3/uL (ref 1.7–7.7)
Neutrophils Relative %: 86 %
Platelets: 184 10*3/uL (ref 150–400)
RBC: 3.6 MIL/uL — ABNORMAL LOW (ref 3.87–5.11)
RDW: 13 % (ref 11.5–15.5)
WBC: 6.6 10*3/uL (ref 4.0–10.5)

## 2015-08-17 LAB — URINALYSIS, ROUTINE W REFLEX MICROSCOPIC
Bilirubin Urine: NEGATIVE
Glucose, UA: NEGATIVE mg/dL
Ketones, ur: NEGATIVE mg/dL
Nitrite: NEGATIVE
Protein, ur: 100 mg/dL — AB
Specific Gravity, Urine: 1.015 (ref 1.005–1.030)
pH: 5.5 (ref 5.0–8.0)

## 2015-08-17 LAB — BASIC METABOLIC PANEL
Anion gap: 13 (ref 5–15)
BUN: 57 mg/dL — ABNORMAL HIGH (ref 6–20)
CO2: 28 mmol/L (ref 22–32)
Calcium: 8.6 mg/dL — ABNORMAL LOW (ref 8.9–10.3)
Chloride: 91 mmol/L — ABNORMAL LOW (ref 101–111)
Creatinine, Ser: 4.11 mg/dL — ABNORMAL HIGH (ref 0.44–1.00)
GFR calc Af Amer: 13 mL/min — ABNORMAL LOW (ref >60)
GFR calc non Af Amer: 11 mL/min — ABNORMAL LOW (ref >60)
Glucose, Bld: 110 mg/dL — ABNORMAL HIGH (ref 65–99)
Potassium: 2.9 mmol/L — ABNORMAL LOW (ref 3.5–5.1)
Sodium: 132 mmol/L — ABNORMAL LOW (ref 135–145)

## 2015-08-17 LAB — URINE MICROSCOPIC-ADD ON

## 2015-08-17 LAB — RAPID STREP SCREEN (MED CTR MEBANE ONLY): Streptococcus, Group A Screen (Direct): NEGATIVE

## 2015-08-17 LAB — LACTIC ACID, PLASMA
Lactic Acid, Venous: 0.7 mmol/L (ref 0.5–2.0)
Lactic Acid, Venous: 0.9 mmol/L (ref 0.5–2.0)

## 2015-08-17 LAB — MAGNESIUM: Magnesium: 2 mg/dL (ref 1.7–2.4)

## 2015-08-17 LAB — INFLUENZA PANEL BY PCR (TYPE A & B)
H1N1 flu by pcr: NOT DETECTED
Influenza A By PCR: NEGATIVE
Influenza B By PCR: NEGATIVE

## 2015-08-17 LAB — LIPASE, BLOOD: Lipase: 10 U/L — ABNORMAL LOW (ref 11–51)

## 2015-08-17 MED ORDER — POTASSIUM CHLORIDE 10 MEQ/100ML IV SOLN
10.0000 meq | Freq: Once | INTRAVENOUS | Status: AC
  Administered 2015-08-17: 10 meq via INTRAVENOUS
  Filled 2015-08-17: qty 100

## 2015-08-17 MED ORDER — ONDANSETRON HCL 4 MG/2ML IJ SOLN
4.0000 mg | INTRAMUSCULAR | Status: DC | PRN
Start: 2015-08-17 — End: 2015-08-17
  Administered 2015-08-17: 4 mg via INTRAVENOUS
  Filled 2015-08-17: qty 2

## 2015-08-17 MED ORDER — SODIUM CHLORIDE 0.9 % IV BOLUS (SEPSIS)
1000.0000 mL | Freq: Once | INTRAVENOUS | Status: AC
  Administered 2015-08-17: 1000 mL via INTRAVENOUS

## 2015-08-17 MED ORDER — FAMOTIDINE IN NACL 20-0.9 MG/50ML-% IV SOLN
20.0000 mg | Freq: Once | INTRAVENOUS | Status: AC
  Administered 2015-08-17: 20 mg via INTRAVENOUS
  Filled 2015-08-17: qty 50

## 2015-08-17 MED ORDER — SODIUM CHLORIDE 0.9 % IV SOLN
INTRAVENOUS | Status: DC
  Administered 2015-08-17: 23:00:00 via INTRAVENOUS

## 2015-08-17 NOTE — ED Notes (Signed)
Physician eval

## 2015-08-17 NOTE — H&P (Signed)
[facility] History and Physical  Brittainy Merle Ahlquist [id number] DOB: 02-06-1956 DOA: 08/17/2015  Referring physician: Dr Thurnell Garbe PCP: Mickie Hillier, MD   Chief Complaint: myalgias, n/v, congestion for 5 days  HPI: Cheryl Gordon is a 60 y.o. female presenting with fevers, congestion, myalgias and n/v, for 3-4 days, fever 102 at home initially, none the last few days.  Creat 4.41 , K low 2.9.  Strep and flu swab are negative, negative for PNA. Lives at home. Family here. 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    No chest pain, no abd pain, no diarrhea.  No bm last couple of days. No dysuria. Hx interstitial cystitis, gets UTI's every couple of months.  +lightheaded, thirsty and very weak.    ROS  denies CP  no joint pain   no HA  no blurry vision  no rash  no diarrhea  no dysuria  no difficulty voiding  no change in urine color    Where does patient live home  Can patient participate in ADLs? yes  Past Medical History  Past Medical History  Diagnosis Date  . Spina bifida ([place])   . Hypothyroid   . Chronic pain   . Migraine headache   . 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 Interstitial cystitis   . Hyperlipidemia   . Hypertension 2002   Past Surgical History  Past Surgical History  Procedure Laterality Date  . Cesarean section      x3  . Colonoscopy    . Abdominal hysterectomy    . Oophorectomy     Family History  Family History  Problem Relation Age of Onset  . Hypertension Father    Social History  reports that she has been smoking.  She does not have any smokeless tobacco history on file. She reports that she does not drink alcohol or use illicit drugs. Allergies  Allergies  Allergen Reactions  . Sulfa Antibiotics   . Zithromax [Azithromycin]    Home medications Prior to Admission medications   Medication Sig Start Date End Date Taking?  Authorizing Provider  buprenorphine (SUBUTEX) 8 MG SUBL SL tablet Place under the tongue daily.    Historical Provider, MD  chlorzoxazone (PARAFON) 500 MG tablet Take 1 tablet (500 mg total) by mouth 3 (three) times daily as needed for muscle spasms. 05/18/15   Mikey Kirschner, MD  etodolac (LODINE) 400 MG tablet Take 1 tablet (400 mg total) by mouth 2 (two) times daily. 05/18/15   Mikey Kirschner, MD  fluconazole (DIFLUCAN) 100 MG tablet TAKE 2 TABLETS THE FIRST DAY, THEN TAKE 1 TABLET ONCE DAILY FOR 14 DAYS FOR THRUSH. 06/08/15   Mikey Kirschner, MD  indapamide (LOZOL) 2.5 MG tablet TAKE ONE (1) TABLET EACH DAY 03/05/15   Mikey Kirschner, MD  levothyroxine (SYNTHROID, LEVOTHROID) 125 MCG tablet TAKE ONE (1) TABLET EACH DAY BEFORE BREALFAST 01/08/15   Mikey Kirschner, MD  predniSONE (DELTASONE) 20 MG tablet Take 3 for 3 days, then 2 for 3 days, then 1 for 2 days 06/02/15   Mikey Kirschner, MD  promethazine (PHENERGAN) 25 MG tablet TAKE ONE TABLET TWICE DAILY 06/03/15   Mikey Kirschner, MD  ramipril (ALTACE) 10 MG capsule TAKE ONE (1) CAPSULE EACH DAY 01/28/15   Mikey Kirschner, MD  traMADol (ULTRAM) 50 MG tablet Take 1 tablet every 6 hours as  needed for pain. Patient not taking: Reported on 05/24/2015 05/21/15   Mikey Kirschner, MD   Liver Function Tests  Recent Labs Lab 08/17/15 1736  AST 19  ALT 17  ALKPHOS 98  BILITOT 0.6  PROT 7.3  ALBUMIN 3.3*    Recent Labs Lab 08/17/15 1736  LIPASE 10*   CBC  Recent Labs Lab 08/17/15 1736  WBC 6.6  NEUTROABS 5.6  HGB 10.8*  HCT 31.6*  MCV 87.8  PLT [id number]   Basic Metabolic Panel  Recent Labs Lab 08/17/15 1736  NA 132*  K 2.9*  CL 91*  CO2 28  GLUCOSE 110*  BUN 57*  CREATININE 4.11*  CALCIUM 8.6*     Filed Vitals:   08/17/15 1709 08/17/15 2122  BP: 104/70 133/81  Pulse: 118 110  Temp: 98.4 F (36.9 C)   TempSrc: Oral   Resp: 16 20  Height: 5\' 2"  (1.575 m)   Weight: 63.957 kg (141 lb)   SpO2: 100% 100%    Exam: VS: BP 104/70  HR 110  RR 20   RA sat 100% Gen looks very tired and uncomfortable, no distress, nasal congestion No rash, cyanosis or gangrene Sclera anicteric, throat dry, no exudates  No jvd or bruits flat neck veins Chest clear bilat bilat RRR no MRG tachy Abd soft ntnd no mass or ascites +bs GU defer MS no joint effusions or deformity Ext no edema / wounds/ ulcers Neuro is alert, Ox 3 , nf  CXR (independently reviewed) > no active disease  Home medications > subutex, Parafon, etodolac 400 bid (not taking), diflucan for thrush, indapamide (Lozol) 2.5 mg /d, synthroid, phenergan, ramipril, tramadol 50 qid prn  Na 132  K 2.9  Cl 91  BUN 57  Creat 4.11  Glu 110  Mg 2.0  Alb 3.3  wBC 6k  Hb 10.8  plt 184 UA 6-30 wbc/ rbc, clear , many bact  Assessment: 1. Fever/ myalgias/ N/V/ diarrhea - prob viral illness. Strep screen neg, flu swab pending.  2. Dehydration/ vol depletion 3. Acute renal failure - hopefully just vol related. Not taking nsaid as listed in home med list, is on ACEi and diuretic though.  4. HTN on acei/ diuretic 5. Chronic back pain / pain med addiction - on long-term subutex, 1/day 6. Hx closed spina bifida 7. Hx interstitial cystitis  Plan - admit, IVF's, pain medication/ anti-emetics, f/u creat, hold acei/ diuretic. F/U cultures, hold abx for now.      DVT Prophylaxis sq hep  Code Status: full  Family Communication: at bedside  Disposition Plan: home when better        Sol Blazing [facility] Pager [phone]  Cell [phone]  If 7PM-7AM, please contact night-coverage [email or web address] Password [email or web address] 08/17/2015, 9:33 PM

## 2015-08-17 NOTE — ED Notes (Signed)
Pt feeling better, request something for her back pain. Hospitalist in dept. Pt children are eating in the room without the pt complaining of increased nausea

## 2015-08-17 NOTE — ED Notes (Signed)
Pt states she began to feel achy with a cough on Friday. Pt was given tamiflu. Today pt went to urgent care because she didn't feel any better. Per urgent care pt may have pneumonia. Pt has increased pulse in triage (HR 115). Pt states she has had emesis, denies diarrhea.

## 2015-08-17 NOTE — ED Provider Notes (Signed)
CSN: [id number]     Arrival date & time 08/17/15  1702 History   First MD Initiated Contact with Patient 08/17/15 2039     Chief Complaint  Patient presents with  . Cough  . Emesis      HPI Pt was seen at 2040.  Per pt, c/o gradual onset and persistence of constant sore throat, runny/stuffy nose, sinus congestion, and cough for the past 4 days. Has been associated with home fevers to "102" (last fever 2 days ago), generalized body aches/fatigue, multiple intermittent episodes of N/V, as well as minimal PO intake. Pt was evaluated by her PMD, rx tamiflu for assumed flu. Pt went to [facility] today because she "didn't feel any better" and was then sent to the ED for further evaluation.  Denies rash, no neck pain, no CP/SOB, no diarrhea, no abd pain.    Past Medical History  Diagnosis Date  . Spina bifida ([place])   . Hypothyroid   . Chronic pain   . Migraine headache   . Dependency on pain medication ([place])   . Interstitial cystitis   . Hyperlipidemia   . Hypertension 2002   Past Surgical History  Procedure Laterality Date  . Cesarean section      x3  . Colonoscopy    . Abdominal hysterectomy    . Oophorectomy     Family History  Problem Relation Age of Onset  . Hypertension Father    Social History  Substance Use Topics  . Smoking status: Current Some Day Smoker  . Smokeless tobacco: None  . Alcohol Use: No    Review of Systems ROS: Statement: All systems negative except as marked or noted in the HPI; Constitutional: +fever and chills, generalized body aches/fatigue. ; ; Eyes: Negative for eye pain, redness and discharge. ; ; ENMT: Negative for ear pain, hoarseness, nasal congestion, sinus pressure and sore throat. ; ; Cardiovascular: Negative for chest pain, palpitations, diaphoresis, dyspnea and peripheral edema. ; ; Respiratory: +cough. Negative for wheezing and stridor. ; ; Gastrointestinal: +N/V, poor PO intake. Negative for diarrhea, abdominal pain, blood in stool, hematemesis,  jaundice and rectal bleeding. . ; ; Genitourinary: Negative for dysuria, flank pain and hematuria. ; ; Musculoskeletal: Negative for back pain and neck pain. Negative for swelling and trauma.; ; Skin: Negative for pruritus, rash, abrasions, blisters, bruising and skin lesion.; ; Neuro: Negative for headache, lightheadedness and neck stiffness. Negative for weakness, altered level of consciousness , altered mental status, extremity weakness, paresthesias, involuntary movement, seizure and syncope.      Allergies  Sulfa antibiotics and Zithromax  Home Medications   Prior to Admission medications   Medication Sig Start Date End Date Taking? Authorizing Provider  buprenorphine (SUBUTEX) 8 MG SUBL SL tablet Place under the tongue daily.    Historical Provider, MD  chlorzoxazone (PARAFON) 500 MG tablet Take 1 tablet (500 mg total) by mouth 3 (three) times daily as needed for muscle spasms. 05/18/15   Mikey Kirschner, MD  etodolac (LODINE) 400 MG tablet Take 1 tablet (400 mg total) by mouth 2 (two) times daily. 05/18/15   Mikey Kirschner, MD  fluconazole (DIFLUCAN) 100 MG tablet TAKE 2 TABLETS THE FIRST DAY, THEN TAKE 1 TABLET ONCE DAILY FOR 14 DAYS FOR THRUSH. 06/08/15   Mikey Kirschner, MD  indapamide (LOZOL) 2.5 MG tablet TAKE ONE (1) TABLET EACH DAY 03/05/15   Mikey Kirschner, MD  levothyroxine (SYNTHROID, LEVOTHROID) 125 MCG tablet TAKE ONE (1) TABLET EACH DAY BEFORE  BREALFAST 01/08/15   Mikey Kirschner, MD  predniSONE (DELTASONE) 20 MG tablet Take 3 for 3 days, then 2 for 3 days, then 1 for 2 days 06/02/15   Mikey Kirschner, MD  promethazine (PHENERGAN) 25 MG tablet TAKE ONE TABLET TWICE DAILY 06/03/15   Mikey Kirschner, MD  ramipril (ALTACE) 10 MG capsule TAKE ONE (1) CAPSULE EACH DAY 01/28/15   Mikey Kirschner, MD  traMADol (ULTRAM) 50 MG tablet Take 1 tablet every 6 hours as needed for pain. Patient not taking: Reported on 05/24/2015 05/21/15   Mikey Kirschner, MD   BP 104/70 mmHg  Pulse  118  Temp(Src) 98.4 F (36.9 C) (Oral)  Resp 16  Ht 5\' 2"  (1.575 m)  Wt 141 lb (63.957 kg)  BMI 25.78 kg/m2  SpO2 100%  BP 133/81 mmHg  Pulse 110  Temp(Src) 98.4 F (36.9 C) (Oral)  Resp 20  Ht 5\' 2"  (1.575 m)  Wt 141 lb (63.957 kg)  BMI 25.78 kg/m2  SpO2 100%  Physical Exam  2045; Physical examination:  Nursing notes reviewed; Vital signs and O2 SAT reviewed;  Constitutional: Well developed, Well nourished, In no acute distress; Head:  Normocephalic, atraumatic; Eyes: EOMI, PERRL, No scleral icterus; ENMT: TM's clear bilat. +edemetous nasal turbinates bilat with clear rhinorrhea. Mouth and pharynx without lesions. No tonsillar exudates. No intra-oral edema. No submandibular or sublingual edema. No hoarse voice, no drooling, no stridor. No pain with manipulation of larynx. No trismus. Mouth and pharynx normal, Mucous membranes dry; Neck: Supple, Full range of motion, No lymphadenopathy; Cardiovascular: Tachycardic rate and rhythm, No gallop; Respiratory: Breath sounds clear & equal bilaterally, No wheezes.  Speaking full sentences with ease, Normal respiratory effort/excursion; Chest: Nontender, Movement normal; Abdomen: Soft, Nontender, Nondistended, Normal bowel sounds; Genitourinary: No CVA tenderness; Extremities: Pulses normal, No tenderness, No edema, No calf edema or asymmetry.; Neuro: AA&Ox3, Major CN grossly intact.  Speech clear. No gross focal motor or sensory deficits in extremities.; Skin: Color normal, Warm, Dry.   ED Course  Procedures (including critical care time) Labs Review   Imaging Review  I have personally reviewed and evaluated these images and lab results as part of my medical decision-making.   EKG Interpretation None      MDM  MDM Reviewed: previous chart, nursing note and vitals Reviewed previous: labs Interpretation: labs and x-ray     Results for orders placed or performed during the hospital encounter of 08/17/15  Rapid strep screen  Result  Value Ref Range   Streptococcus, Group A Screen (Direct) NEGATIVE NEGATIVE  CBC with Differential  Result Value Ref Range   WBC 6.6 4.0 - 10.5 K/uL   RBC 3.60 (L) 3.87 - 5.11 MIL/uL   Hemoglobin 10.8 (L) 12.0 - 15.0 g/dL   HCT 31.6 (L) 36.0 - 46.0 %   MCV 87.8 78.0 - 100.0 fL   MCH 30.0 26.0 - 34.0 pg   MCHC 34.2 30.0 - 36.0 g/dL   RDW 13.0 11.5 - 15.5 %   Platelets 184 150 - 400 K/uL   Neutrophils Relative % 86 %   Neutro Abs 5.6 1.7 - 7.7 K/uL   Lymphocytes Relative 3 %   Lymphs Abs 0.2 (L) 0.7 - 4.0 K/uL   Monocytes Relative 10 %   Monocytes Absolute 0.7 0.1 - 1.0 K/uL   Eosinophils Relative 1 %   Eosinophils Absolute 0.1 0.0 - 0.7 K/uL   Basophils Relative 0 %   Basophils Absolute 0.0 0.0 -  0.1 K/uL  Basic metabolic panel  Result Value Ref Range   Sodium 132 (L) 135 - 145 mmol/L   Potassium 2.9 (L) 3.5 - 5.1 mmol/L   Chloride 91 (L) 101 - 111 mmol/L   CO2 28 22 - 32 mmol/L   Glucose, Bld 110 (H) 65 - 99 mg/dL   BUN 57 (H) 6 - 20 mg/dL   Creatinine, Ser 4.11 (H) 0.44 - 1.00 mg/dL   Calcium 8.6 (L) 8.9 - 10.3 mg/dL   GFR calc non Af Amer 11 (L) >60 mL/min   GFR calc Af Amer 13 (L) >60 mL/min   Anion gap 13 5 - 15  Lactic acid, plasma  Result Value Ref Range   Lactic Acid, Venous 0.7 0.5 - 2.0 mmol/L  Magnesium  Result Value Ref Range   Magnesium 2.0 1.7 - 2.4 mg/dL  Hepatic function panel  Result Value Ref Range   Total Protein 7.3 6.5 - 8.1 g/dL   Albumin 3.3 (L) 3.5 - 5.0 g/dL   AST 19 15 - 41 U/L   ALT 17 14 - 54 U/L   Alkaline Phosphatase 98 38 - 126 U/L   Total Bilirubin 0.6 0.3 - 1.2 mg/dL   Bilirubin, Direct 0.3 0.1 - 0.5 mg/dL   Indirect Bilirubin 0.3 0.3 - 0.9 mg/dL  Lipase, blood  Result Value Ref Range   Lipase 10 (L) 11 - 51 U/L   Dg Chest 2 View 08/17/2015  CLINICAL DATA:  Cough with possible pneumonia.  Smoker. EXAM: CHEST  2 VIEW COMPARISON:  06/01/2014 FINDINGS: Midline trachea. Normal heart size and mediastinal contours. No pleural effusion  or pneumothorax. Diffuse peribronchial thickening. Clear lungs. IMPRESSION: 1.  No acute cardiopulmonary disease. 2. Mild peribronchial thickening which may relate to chronic bronchitis or smoking. Electronically Signed   By: Abigail Miyamoto [name]   On: 08/17/2015 18:10   Results for ATOYA, CUPO ANN (MRN [id number]) as of 08/17/2015 20:49  Ref. Range 02/11/2015 08:28 08/17/2015 17:36  BUN Latest Ref Range: 6-20 mg/dL 17 57 (H)  Creatinine Latest Ref Range: 0.44-1.00 mg/dL 1.01 (H) 4.11 (H)    2130:   IVF given for new ARF and tachycardia. No fever while in ED. Potassium repleted IV.  Rapid strep and influenza panel pending. Dx and testing d/w pt and family.  Questions answered.  Verb understanding, agreeable to admit. T/C to [facility]. Jonnie Finner, case discussed, including:  HPI, pertinent PM/SHx, VS/PE, dx testing, ED course and treatment:  Agreeable to admit, requests to write temporary orders, obtain tele bed to team APAdmits.   Francine Graven, DO 08/20/15 1746

## 2015-08-18 ENCOUNTER — Inpatient Hospital Stay (HOSPITAL_COMMUNITY): Payer: BC Managed Care – PPO

## 2015-08-18 DIAGNOSIS — E876 Hypokalemia: Secondary | ICD-10-CM

## 2015-08-18 DIAGNOSIS — R42 Dizziness and giddiness: Secondary | ICD-10-CM

## 2015-08-18 DIAGNOSIS — N189 Chronic kidney disease, unspecified: Secondary | ICD-10-CM

## 2015-08-18 DIAGNOSIS — M549 Dorsalgia, unspecified: Secondary | ICD-10-CM

## 2015-08-18 DIAGNOSIS — G8929 Other chronic pain: Secondary | ICD-10-CM

## 2015-08-18 DIAGNOSIS — N179 Acute kidney failure, unspecified: Principal | ICD-10-CM

## 2015-08-18 LAB — COMPREHENSIVE METABOLIC PANEL
ALT: 15 U/L (ref 14–54)
AST: 14 U/L — ABNORMAL LOW (ref 15–41)
Albumin: 2.5 g/dL — ABNORMAL LOW (ref 3.5–5.0)
Alkaline Phosphatase: 88 U/L (ref 38–126)
Anion gap: 11 (ref 5–15)
BUN: 57 mg/dL — ABNORMAL HIGH (ref 6–20)
CO2: 21 mmol/L — ABNORMAL LOW (ref 22–32)
Calcium: 7.2 mg/dL — ABNORMAL LOW (ref 8.9–10.3)
Chloride: 103 mmol/L (ref 101–111)
Creatinine, Ser: 3.86 mg/dL — ABNORMAL HIGH (ref 0.44–1.00)
GFR calc Af Amer: 14 mL/min — ABNORMAL LOW (ref >60)
GFR calc non Af Amer: 12 mL/min — ABNORMAL LOW (ref >60)
Glucose, Bld: 103 mg/dL — ABNORMAL HIGH (ref 65–99)
Potassium: 2.8 mmol/L — ABNORMAL LOW (ref 3.5–5.1)
Sodium: 135 mmol/L (ref 135–145)
Total Bilirubin: 0.7 mg/dL (ref 0.3–1.2)
Total Protein: 5.7 g/dL — ABNORMAL LOW (ref 6.5–8.1)

## 2015-08-18 LAB — CBC
HCT: 26.1 % — ABNORMAL LOW (ref 36.0–46.0)
Hemoglobin: 9 g/dL — ABNORMAL LOW (ref 12.0–15.0)
MCH: 30.2 pg (ref 26.0–34.0)
MCHC: 34.5 g/dL (ref 30.0–36.0)
MCV: 87.6 fL (ref 78.0–100.0)
Platelets: 161 10*3/uL (ref 150–400)
RBC: 2.98 MIL/uL — ABNORMAL LOW (ref 3.87–5.11)
RDW: 13.3 % (ref 11.5–15.5)
WBC: 5.5 10*3/uL (ref 4.0–10.5)

## 2015-08-18 MED ORDER — HYDROMORPHONE HCL 1 MG/ML IJ SOLN
1.0000 mg | INTRAMUSCULAR | Status: DC | PRN
  Administered 2015-08-18 – 2015-08-20 (×7): 1 mg via INTRAVENOUS
  Filled 2015-08-18 (×7): qty 1

## 2015-08-18 MED ORDER — DEXTROSE 5 % IV SOLN
INTRAVENOUS | Status: AC
  Filled 2015-08-18: qty 10

## 2015-08-18 MED ORDER — MORPHINE SULFATE (PF) 2 MG/ML IV SOLN
1.0000 mg | INTRAVENOUS | Status: DC | PRN
  Administered 2015-08-18: 1 mg via INTRAVENOUS
  Administered 2015-08-18: 2 mg via INTRAVENOUS
  Filled 2015-08-18 (×3): qty 1

## 2015-08-18 MED ORDER — PROMETHAZINE HCL 25 MG/ML IJ SOLN
12.5000 mg | Freq: Four times a day (QID) | INTRAMUSCULAR | Status: DC | PRN
  Administered 2015-08-18 – 2015-08-20 (×9): 12.5 mg via INTRAVENOUS
  Filled 2015-08-18 (×9): qty 1

## 2015-08-18 MED ORDER — ONDANSETRON HCL 4 MG/2ML IJ SOLN
4.0000 mg | Freq: Four times a day (QID) | INTRAMUSCULAR | Status: DC | PRN
  Administered 2015-08-18 (×2): 4 mg via INTRAVENOUS
  Filled 2015-08-18 (×3): qty 2

## 2015-08-18 MED ORDER — POTASSIUM CHLORIDE 10 MEQ/100ML IV SOLN
10.0000 meq | INTRAVENOUS | Status: AC
  Administered 2015-08-18 (×2): 10 meq via INTRAVENOUS
  Filled 2015-08-18 (×2): qty 100

## 2015-08-18 MED ORDER — SODIUM CHLORIDE 0.9 % IV SOLN
INTRAVENOUS | Status: AC
  Administered 2015-08-18: via INTRAVENOUS

## 2015-08-18 MED ORDER — KCL IN DEXTROSE-NACL 40-5-0.9 MEQ/L-%-% IV SOLN
INTRAVENOUS | Status: DC
  Filled 2015-08-18 (×3): qty 1000

## 2015-08-18 MED ORDER — ACETAMINOPHEN 650 MG RE SUPP: 650.0000 mg | Freq: Four times a day (QID) | RECTAL | Status: DC | PRN

## 2015-08-18 MED ORDER — SODIUM CHLORIDE 0.9 % IV SOLN
INTRAVENOUS | Status: DC
Start: 2015-08-18 — End: 2015-08-18
  Administered 2015-08-18: 11:00:00 via INTRAVENOUS

## 2015-08-18 MED ORDER — HEPARIN SODIUM (PORCINE) 5000 UNIT/ML IJ SOLN
5000.0000 [IU] | Freq: Three times a day (TID) | INTRAMUSCULAR | Status: DC
  Administered 2015-08-18 – 2015-08-20 (×7): 5000 [IU] via SUBCUTANEOUS
  Filled 2015-08-18 (×8): qty 1

## 2015-08-18 MED ORDER — SODIUM CHLORIDE 0.9 % IV BOLUS (SEPSIS)
2000.0000 mL | Freq: Once | INTRAVENOUS | Status: AC
  Administered 2015-08-18: 2000 mL via INTRAVENOUS

## 2015-08-18 MED ORDER — LEVOTHYROXINE SODIUM 25 MCG PO TABS
125.0000 ug | ORAL_TABLET | Freq: Every day | ORAL | Status: DC
  Administered 2015-08-18 – 2015-08-20 (×2): 125 ug via ORAL
  Filled 2015-08-18 (×2): qty 1

## 2015-08-18 MED ORDER — ONDANSETRON HCL 4 MG PO TABS
4.0000 mg | ORAL_TABLET | Freq: Four times a day (QID) | ORAL | Status: DC | PRN
  Administered 2015-08-20: 4 mg via ORAL

## 2015-08-18 MED ORDER — ACETAMINOPHEN 325 MG PO TABS
650.0000 mg | ORAL_TABLET | Freq: Four times a day (QID) | ORAL | Status: DC | PRN
  Administered 2015-08-18 (×2): 650 mg via ORAL
  Filled 2015-08-18 (×2): qty 2

## 2015-08-18 MED ORDER — KCL IN DEXTROSE-NACL 40-5-0.45 MEQ/L-%-% IV SOLN
INTRAVENOUS | Status: DC
  Administered 2015-08-18 – 2015-08-20 (×4): via INTRAVENOUS

## 2015-08-18 MED ORDER — BUPRENORPHINE HCL 2 MG SL SUBL
8.0000 mg | SUBLINGUAL_TABLET | Freq: Every day | SUBLINGUAL | Status: DC
Start: 2015-08-18 — End: 2015-08-20
  Administered 2015-08-18 – 2015-08-20 (×2): 8 mg via SUBLINGUAL
  Filled 2015-08-18 (×2): qty 4

## 2015-08-18 MED ORDER — DEXTROSE 5 % IV SOLN
1.0000 g | INTRAVENOUS | Status: DC
  Administered 2015-08-18 – 2015-08-19 (×2): 1 g via INTRAVENOUS
  Filled 2015-08-18 (×5): qty 10

## 2015-08-18 MED ORDER — KETOROLAC TROMETHAMINE 30 MG/ML IJ SOLN
30.0000 mg | Freq: Once | INTRAMUSCULAR | Status: AC
  Administered 2015-08-18: 30 mg via INTRAVENOUS
  Filled 2015-08-18: qty 1

## 2015-08-18 NOTE — Progress Notes (Signed)
[facility] PROGRESS NOTE  Amissa Moline Preslar [id number] DOB: May 19, 1956    PCP:   Mickie Hillier, MD   HPI: Cheryl Gordon is an 60 y.o. female with hx of spina bifida, hypothyrodism, HLD, HTN, admitted for nausea, vomiting, UTI, and AKI.  She was felt to have been dehydrated due to her viral illness.  Her Cr 6 mo ago was normal, and upon admission, it was 4.11.  It has improved to about 3.8 today.  Renal US has no hydronephrosis.  She has persistent nausea and vomiting.    Rewiew of Systems:  Constitutional: Negative for malaise, fever and chills. No significant weight loss or weight gain Eyes: Negative for eye pain, redness and discharge, diplopia, visual changes, or flashes of light. ENMT: Negative for ear pain, hoarseness, nasal congestion, sinus pressure and sore throat. No headaches; tinnitus, drooling, or problem swallowing. Cardiovascular: Negative for chest pain, palpitations, diaphoresis, dyspnea and peripheral edema. ; No orthopnea, PND Respiratory: Negative for cough, hemoptysis, wheezing and stridor. No pleuritic chestpain. Gastrointestinal: Negative for diarrhea, constipation, melena, blood in stool, hematemesis, jaundice and rectal bleeding.    Genitourinary: Negative for frequency, dysuria, incontinence,flank pain and hematuria; Musculoskeletal: Negative for and neck pain. Negative for swelling and trauma.;  Skin: . Negative for pruritus, rash, abrasions, bruising and skin lesion.; ulcerations Neuro: Negative for headache, lightheadedness and neck stiffness. Negative for weakness, altered level of consciousness , altered mental status, extremity weakness, burning feet, involuntary movement, seizure and syncope.  Psych: negative for anxiety, depression, insomnia, tearfulness, panic attacks, hallucinations, paranoia, suicidal or homicidal ideation    Past Medical History  Diagnosis Date  . Spina bifida ([place])   . Hypothyroid   . Chronic pain   . Migraine headache   .  Dependency on pain medication ([place])   . Interstitial cystitis   . Hyperlipidemia   . Hypertension 2002    Past Surgical History  Procedure Laterality Date  . Cesarean section      x3  . Colonoscopy    . Abdominal hysterectomy    . Oophorectomy      Medications:  HOME MEDS: Prior to Admission medications   Medication Sig Start Date End Date Taking? Authorizing Provider  acetaminophen (TYLENOL) 500 MG tablet Take 1,000 mg by mouth every 6 (six) hours as needed for moderate pain.   Yes Historical Provider, MD  buprenorphine (SUBUTEX) 8 MG SUBL SL tablet Place 8 mg under the tongue daily.    Yes Historical Provider, MD  cholecalciferol (VITAMIN D) 1000 units tablet Take 2,000 Units by mouth daily.   Yes Historical Provider, MD  Garlic [id number] MG CAPS Take 2 capsules by mouth daily.   Yes Historical Provider, MD  ibuprofen (ADVIL,MOTRIN) 200 MG tablet Take 400 mg by mouth every 6 (six) hours as needed for moderate pain.   Yes Historical Provider, MD  indapamide (LOZOL) 2.5 MG tablet TAKE ONE (1) TABLET EACH DAY 03/05/15  Yes Mikey Kirschner, MD  levothyroxine (SYNTHROID, LEVOTHROID) 125 MCG tablet TAKE ONE (1) TABLET EACH DAY BEFORE BREALFAST 01/08/15  Yes Mikey Kirschner, MD  OVER THE COUNTER MEDICATION Take 1 capsule by mouth daily. Bee-alive   Yes Historical Provider, MD  promethazine (PHENERGAN) 25 MG tablet TAKE ONE TABLET TWICE DAILY 06/03/15  Yes Mikey Kirschner, MD  ramipril (ALTACE) 10 MG capsule TAKE ONE (1) CAPSULE EACH DAY 01/28/15  Yes Mikey Kirschner, MD     Allergies:  Allergies  Allergen Reactions  . Sulfa Antibiotics Nausea  And Vomiting  . Zithromax [Azithromycin] Other (See Comments)    unknown    Social History:   reports that she has been smoking.  She does not have any smokeless tobacco history on file. She reports that she does not drink alcohol or use illicit drugs.  Family History: Family History  Problem Relation Age of Onset  . Hypertension Father       Physical Exam: Filed Vitals:   08/17/15 2300 08/17/15 2353 08/18/15 0456 08/18/15 1100  BP: 119/75  101/55 103/68  Pulse: 101  72 84  Temp:   98.2 F (36.8 C) 98.5 F (36.9 C)  TempSrc:   Oral Oral  Resp:   16 18  Height:      Weight:      SpO2: 100% 99% 96% 99%   Blood pressure 103/68, pulse 84, temperature 98.5 F (36.9 C), temperature source Oral, resp. rate 18, height 5\' 2"  (1.575 m), weight 63.957 kg (141 lb), SpO2 99 %.  GEN:  Pleasant  patient lying in the stretcher in no acute distress; cooperative with exam. PSYCH:  alert and oriented x4; does not appear anxious or depressed; affect is appropriate. HEENT: Mucous membranes pink and anicteric; PERRLA; EOM intact; no cervical lymphadenopathy nor thyromegaly or carotid bruit; no JVD; There were no stridor. Neck is very supple. Breasts:: Not examined CHEST WALL: No tenderness CHEST: Normal respiration, clear to auscultation bilaterally.  HEART: Regular rate and rhythm.  There are no murmur, rub, or gallops.   BACK: No kyphosis or scoliosis; no CVA tenderness ABDOMEN: soft and non-tender; no masses, no organomegaly, normal abdominal bowel sounds; no pannus; no intertriginous candida. There is no rebound and no distention. Rectal Exam: Not done EXTREMITIES: No bone or joint deformity; age-appropriate arthropathy of the hands and knees; no edema; no ulcerations.  There is no calf tenderness. Genitalia: not examined PULSES: 2+ and symmetric SKIN: Normal hydration no rash or ulceration CNS: Cranial nerves 2-12 grossly intact no focal lateralizing neurologic deficit.  Speech is fluent; uvula elevated with phonation, facial symmetry and tongue midline. DTR are normal bilaterally, cerebella exam is intact, barbinski is negative and strengths are equaled bilaterally.  No sensory loss.   Labs on Admission:  Basic Metabolic Panel:  Recent Labs Lab 08/17/15 1736 08/18/15 0506  NA 132* 135  K 2.9* 2.8*  CL 91* 103  CO2 28  21*  GLUCOSE 110* 103*  BUN 57* 57*  CREATININE 4.11* 3.86*  CALCIUM 8.6* 7.2*  MG 2.0  --    Liver Function Tests:  Recent Labs Lab 08/17/15 1736 08/18/15 0506  AST 19 14*  ALT 17 15  ALKPHOS 98 88  BILITOT 0.6 0.7  PROT 7.3 5.7*  ALBUMIN 3.3* 2.5*    Recent Labs Lab 08/17/15 1736  LIPASE 10*   CBC:  Recent Labs Lab 08/17/15 1736 08/18/15 0506  WBC 6.6 5.5  NEUTROABS 5.6  --   HGB 10.8* 9.0*  HCT 31.6* 26.1*  MCV 87.8 87.6  PLT 184 161    Radiological Exams on Admission: Dg Chest 2 View  08/17/2015  CLINICAL DATA:  Cough with possible pneumonia.  Smoker. EXAM: CHEST  2 VIEW COMPARISON:  06/01/2014 FINDINGS: Midline trachea. Normal heart size and mediastinal contours. No pleural effusion or pneumothorax. Diffuse peribronchial thickening. Clear lungs. IMPRESSION: 1.  No acute cardiopulmonary disease. 2. Mild peribronchial thickening which may relate to chronic bronchitis or smoking. Electronically Signed   By: Abigail Miyamoto [name]   On: 08/17/2015 18:10  US Renal  08/18/2015  CLINICAL DATA:  Acute renal failure EXAM: RENAL / URINARY TRACT ULTRASOUND COMPLETE COMPARISON:  02/08/2005 FINDINGS: Right Kidney: Length: 10.0 cm. Echogenicity within normal limits. No mass or hydronephrosis visualized. Left Kidney: Length: 11.3 cm. Echogenicity within normal limits. No mass or hydronephrosis visualized. Bladder: Relatively decompressed.  Bilateral ureteral jets are identified. IMPRESSION: No evidence of hydronephrosis.  Within normal limits. Electronically Signed   By: Marybelle Killings [name]   On: 08/18/2015 08:33    EKG: Independently reviewed.    Assessment/Plan Present on Admission:  . Acute renal failure (ARF) ([place]) . Nausea with vomiting . Chronic back pain . Volume depletion . Acute on chronic renal failure (HCC)  PLAN:  UTI: its possible she has pyelonephritis, given her nausea and vomiting.  Will start her on Rocephin IV. AKI: Slight improvement.  Continue with  IVF.   Avoid nephrotoxic drug.  She has been on NSAIDS. Hypokalemia;  Will replete her K. Add K to IVF, and recheck in the am.  Other plans as per orders. Code Status: FULL Haskel Khan, MD.  FACP [facility] Pager [phone] 7pm to 7am.  08/18/2015, 4:20 PM

## 2015-08-18 NOTE — Care Management Note (Signed)
Case Management Note  Patient Details  Name: Cheryl Gordon MRN: [id number] Date of Birth: 07/21/1955  Subjective/Objective:       Alert oreinted from home with spouse , drives self. No DME . Insured. No medication issues.              Action/Plan: Home with self care.   Expected Discharge Date:                  Expected Discharge Plan:  Home/Self Care  In-House Referral:     Discharge planning Services  CM Consult  Post Acute Care Choice:    Choice offered to:     DME Arranged:    DME Agency:     HH Arranged:    [place] Agency:     Status of Service:  Completed, signed off  Medicare Important Message Given:    Date Medicare IM Given:    Medicare IM give by:    Date Additional Medicare IM Given:    Additional Medicare Important Message give by:     If discussed at [place] of Stay Meetings, dates discussed:    Additional Comments:  Alvie Heidelberg, RN 08/18/2015, 4:51 PM

## 2015-08-19 DIAGNOSIS — R6889 Other general symptoms and signs: Secondary | ICD-10-CM

## 2015-08-19 LAB — BASIC METABOLIC PANEL
Anion gap: 9 (ref 5–15)
BUN: 43 mg/dL — ABNORMAL HIGH (ref 6–20)
CO2: 20 mmol/L — ABNORMAL LOW (ref 22–32)
Calcium: 7 mg/dL — ABNORMAL LOW (ref 8.9–10.3)
Chloride: 104 mmol/L (ref 101–111)
Creatinine, Ser: 2.83 mg/dL — ABNORMAL HIGH (ref 0.44–1.00)
GFR calc Af Amer: 20 mL/min — ABNORMAL LOW (ref >60)
GFR calc non Af Amer: 17 mL/min — ABNORMAL LOW (ref >60)
Glucose, Bld: 128 mg/dL — ABNORMAL HIGH (ref 65–99)
Potassium: 3.1 mmol/L — ABNORMAL LOW (ref 3.5–5.1)
Sodium: 133 mmol/L — ABNORMAL LOW (ref 135–145)

## 2015-08-19 NOTE — Clinical Documentation Improvement (Signed)
Hospitalist  Can the diagnosis of CKD be further specified?   CKD Stage I - GFR greater than or equal to 90  CKD Stage II - GFR 60-89  CKD Stage III - GFR 30-59  CKD Stage IV - GFR 15-29  CKD Stage V - GFR < 15  ESRD (End Stage Renal Disease)  Other condition  Unable to clinically determine   Supporting Information: : (risk factors, signs and symptoms, diagnostics, treatment): Patient with acute on chronic renal failure per 3/15 progress notes.      (chronic renal failure codes to chronic kidney disease) Labs:    Bun     Creat      GFR: 3/16:      43        2.83        17 3/14:      57        4.11        11   Please exercise your independent, professional judgment when responding. A specific answer is not anticipated or expected.   Thank You, Alder [phone]

## 2015-08-19 NOTE — Progress Notes (Signed)
[facility] PROGRESS NOTE  Cheryl Gordon [id number] DOB: Apr 17, 1956    PCP:   Mickie Hillier, MD   HPI:  Cheryl Gordon is an 60 y.o. female with hx of spina bifida, hypothyrodism, HLD, HTN, admitted for nausea, vomiting, UTI, and AKI. She was felt to have been dehydrated due to her viral illness. Her Cr 6 mo ago was normal, and upon admission, it was 4.11, which fortunately came down to 3.8 then 2.8 today. Renal US has no hydronephrosis. She has persistent nausea and vomiting. She was started on IV Rocephin and felt better today.   Rewiew of Systems:  Constitutional: Negative for malaise, fever and chills. No significant weight loss or weight gain Eyes: Negative for eye pain, redness and discharge, diplopia, visual changes, or flashes of light. ENMT: Negative for ear pain, hoarseness, nasal congestion, sinus pressure and sore throat. No headaches; tinnitus, drooling, or problem swallowing. Cardiovascular: Negative for chest pain, palpitations, diaphoresis, dyspnea and peripheral edema. ; No orthopnea, PND Respiratory: Negative for cough, hemoptysis, wheezing and stridor. No pleuritic chestpain. Gastrointestinal: Negative for nausea, vomiting, diarrhea, constipation, abdominal pain, melena, blood in stool, hematemesis, jaundice and rectal bleeding.    Genitourinary: Negative for frequency, dysuria, incontinence,flank pain and hematuria; Musculoskeletal: Negative for back pain and neck pain. Negative for swelling and trauma.;  Skin: . Negative for pruritus, rash, abrasions, bruising and skin lesion.; ulcerations Neuro: Negative for headache, lightheadedness and neck stiffness. Negative for weakness, altered level of consciousness , altered mental status, extremity weakness, burning feet, involuntary movement, seizure and syncope.  Psych: negative for anxiety, depression, insomnia, tearfulness, panic attacks, hallucinations, paranoia, suicidal or homicidal ideation   Past Medical  History  Diagnosis Date  . Spina bifida ([place])   . Hypothyroid   . Chronic pain   . Migraine headache   . Dependency on pain medication ([place])   . Interstitial cystitis   . Hyperlipidemia   . Hypertension 2002    Past Surgical History  Procedure Laterality Date  . Cesarean section      x3  . Colonoscopy    . Abdominal hysterectomy    . Oophorectomy      Medications:  HOME MEDS: Prior to Admission medications   Medication Sig Start Date End Date Taking? Authorizing Provider  acetaminophen (TYLENOL) 500 MG tablet Take 1,000 mg by mouth every 6 (six) hours as needed for moderate pain.   Yes Historical Provider, MD  buprenorphine (SUBUTEX) 8 MG SUBL SL tablet Place 8 mg under the tongue daily.    Yes Historical Provider, MD  cholecalciferol (VITAMIN D) 1000 units tablet Take 2,000 Units by mouth daily.   Yes Historical Provider, MD  Garlic 123XX123 MG CAPS Take 2 capsules by mouth daily.   Yes Historical Provider, MD  ibuprofen (ADVIL,MOTRIN) 200 MG tablet Take 400 mg by mouth every 6 (six) hours as needed for moderate pain.   Yes Historical Provider, MD  indapamide (LOZOL) 2.5 MG tablet TAKE ONE (1) TABLET EACH DAY 03/05/15  Yes Mikey Kirschner, MD  levothyroxine (SYNTHROID, LEVOTHROID) 125 MCG tablet TAKE ONE (1) TABLET EACH DAY BEFORE BREALFAST 01/08/15  Yes Mikey Kirschner, MD  OVER THE COUNTER MEDICATION Take 1 capsule by mouth daily. Bee-alive   Yes Historical Provider, MD  promethazine (PHENERGAN) 25 MG tablet TAKE ONE TABLET TWICE DAILY 06/03/15  Yes Mikey Kirschner, MD  ramipril (ALTACE) 10 MG capsule TAKE ONE (1) CAPSULE EACH DAY 01/28/15  Yes Mikey Kirschner, MD  Allergies:  Allergies  Allergen Reactions  . Sulfa Antibiotics Nausea And Vomiting  . Zithromax [Azithromycin] Other (See Comments)    unknown    Social History:   reports that she has been smoking.  She does not have any smokeless tobacco history on file. She reports that she does not drink alcohol or use  illicit drugs.  Family History: Family History  Problem Relation Age of Onset  . Hypertension Father      Physical Exam: Filed Vitals:   08/18/15 2044 08/19/15 0253 08/19/15 0520 08/19/15 1500  BP: 106/63  112/66 123/60  Pulse: 79  88 80  Temp: 102.2 F (39 C) 98.6 F (37 C) 98.6 F (37 C) 99.5 F (37.5 C)  TempSrc: Oral Oral Oral Oral  Resp: 20  20 21   Height:      Weight:   67.722 kg (149 lb 4.8 oz)   SpO2: 99%  94% 98%   Blood pressure 123/60, pulse 80, temperature 99.5 F (37.5 C), temperature source Oral, resp. rate 21, height 5\' 2"  (1.575 m), weight 67.722 kg (149 lb 4.8 oz), SpO2 98 %.  GEN:  Pleasant patient lying in the stretcher in no acute distress; cooperative with exam. PSYCH:  alert and oriented x4; does not appear anxious or depressed; affect is appropriate. HEENT: Mucous membranes pink and anicteric; PERRLA; EOM intact; no cervical lymphadenopathy nor thyromegaly or carotid bruit; no JVD; There were no stridor. Neck is very supple. Breasts:: Not examined CHEST WALL: No tenderness CHEST: Normal respiration, clear to auscultation bilaterally.  HEART: Regular rate and rhythm.  There are no murmur, rub, or gallops.   BACK: No kyphosis or scoliosis; no CVA tenderness ABDOMEN: soft and non-tender; no masses, no organomegaly, normal abdominal bowel sounds; no pannus; no intertriginous candida. There is no rebound and no distention. Rectal Exam: Not done EXTREMITIES: No bone or joint deformity; age-appropriate arthropathy of the hands and knees; no edema; no ulcerations.  There is no calf tenderness. Genitalia: not examined PULSES: 2+ and symmetric SKIN: Normal hydration no rash or ulceration CNS: Cranial nerves 2-12 grossly intact no focal lateralizing neurologic deficit.  Speech is fluent; uvula elevated with phonation, facial symmetry and tongue midline. DTR are normal bilaterally, cerebella exam is intact, barbinski is negative and strengths are equaled  bilaterally.  No sensory loss.   Labs on Admission:  Basic Metabolic Panel:  Recent Labs Lab 08/17/15 1736 08/18/15 0506 08/19/15 0549  NA 132* 135 133*  K 2.9* 2.8* 3.1*  CL 91* 103 104  CO2 28 21* 20*  GLUCOSE 110* 103* 128*  BUN 57* 57* 43*  CREATININE 4.11* 3.86* 2.83*  CALCIUM 8.6* 7.2* 7.0*  MG 2.0  --   --    Liver Function Tests:  Recent Labs Lab 08/17/15 1736 08/18/15 0506  AST 19 14*  ALT 17 15  ALKPHOS 98 88  BILITOT 0.6 0.7  PROT 7.3 5.7*  ALBUMIN 3.3* 2.5*    Recent Labs Lab 08/17/15 1736  LIPASE 10*   CBC:  Recent Labs Lab 08/17/15 1736 08/18/15 0506  WBC 6.6 5.5  NEUTROABS 5.6  --   HGB 10.8* 9.0*  HCT 31.6* 26.1*  MCV 87.8 87.6  PLT 184 161    Radiological Exams on Admission: Dg Chest 2 View  08/17/2015  CLINICAL DATA:  Cough with possible pneumonia.  Smoker. EXAM: CHEST  2 VIEW COMPARISON:  06/01/2014 FINDINGS: Midline trachea. Normal heart size and mediastinal contours. No pleural effusion or pneumothorax. Diffuse peribronchial thickening.  Clear lungs. IMPRESSION: 1.  No acute cardiopulmonary disease. 2. Mild peribronchial thickening which may relate to chronic bronchitis or smoking. Electronically Signed   By: Abigail Miyamoto [name]   On: 08/17/2015 18:10   US Renal  08/18/2015  CLINICAL DATA:  Acute renal failure EXAM: RENAL / URINARY TRACT ULTRASOUND COMPLETE COMPARISON:  02/08/2005 FINDINGS: Right Kidney: Length: 10.0 cm. Echogenicity within normal limits. No mass or hydronephrosis visualized. Left Kidney: Length: 11.3 cm. Echogenicity within normal limits. No mass or hydronephrosis visualized. Bladder: Relatively decompressed.  Bilateral ureteral jets are identified. IMPRESSION: No evidence of hydronephrosis.  Within normal limits. Electronically Signed   By: Marybelle Killings [name]   On: 08/18/2015 08:33   EKG: Independently reviewed.   Assessment/Plan Present on Admission:  . Acute renal failure (ARF) ([place]) . Nausea with vomiting .  Chronic back pain . Volume depletion . Acute on chronic renal failure ([facility])  PLAN:  UTI: its possible she has pyelonephritis, given her nausea and vomiting. Will start her on Rocephin IV. AKI: Slight improvement. Continue with IVF. Avoid nephrotoxic drug. She has been on NSAIDS. Hypokalemia; Will replete her K. Add K to IVF, and recheck in the am.   Other plans as per orders. Code Status: FULL Haskel Khan, MD.  FACP [facility] Pager [phone] 7pm to 7am.  08/19/2015, 4:52 PM

## 2015-08-19 NOTE — Progress Notes (Signed)
Discussed Advance Directives materials. Will follow up.

## 2015-08-20 LAB — BASIC METABOLIC PANEL
Anion gap: 7 (ref 5–15)
BUN: 24 mg/dL — ABNORMAL HIGH (ref 6–20)
CO2: 22 mmol/L (ref 22–32)
Calcium: 7.4 mg/dL — ABNORMAL LOW (ref 8.9–10.3)
Chloride: 106 mmol/L (ref 101–111)
Creatinine, Ser: 1.74 mg/dL — ABNORMAL HIGH (ref 0.44–1.00)
GFR calc Af Amer: 36 mL/min — ABNORMAL LOW (ref >60)
GFR calc non Af Amer: 31 mL/min — ABNORMAL LOW (ref >60)
Glucose, Bld: 122 mg/dL — ABNORMAL HIGH (ref 65–99)
Potassium: 3.4 mmol/L — ABNORMAL LOW (ref 3.5–5.1)
Sodium: 135 mmol/L (ref 135–145)

## 2015-08-20 LAB — URINE CULTURE: Culture: >=100000

## 2015-08-20 MED ORDER — CIPROFLOXACIN HCL 250 MG PO TABS: 500.0000 mg | ORAL_TABLET | Freq: Two times a day (BID) | ORAL | Status: DC

## 2015-08-20 MED ORDER — POTASSIUM CHLORIDE ER 10 MEQ PO TBCR: 10.0000 meq | EXTENDED_RELEASE_TABLET | Freq: Two times a day (BID) | ORAL | Status: DC

## 2015-08-20 MED ORDER — CIPROFLOXACIN HCL 500 MG PO TABS: 500.0000 mg | ORAL_TABLET | Freq: Two times a day (BID) | ORAL | Status: DC

## 2015-08-20 NOTE — Progress Notes (Signed)
Pt is being d/c'd home with daughter at bedside.  She will be transported to front door via w/c.  Reviewed D/C orders and addressed all issues and questions.  Prescription for ABT were sent to her pharmacy by Dr. Marin Comment.  Pt states she will schedule f/u appt with pcp: Luking within 2 weeks as to not interfere with her husbands appt's, as he is seeing many specialist.  Pt is d/c'd in stable condition.

## 2015-08-20 NOTE — Discharge Summary (Signed)
Physician Discharge Summary  Cheryl Gordon [id number] DOB: [birth year] DOA: 08/17/2015  PCP: Mickie Hillier, MD  Admit date: 08/17/2015 Discharge date: 08/20/2015  Time spent: 35 minutes  Recommendations for Outpatient Follow-up:  1. Follow up with PCP next week.    Discharge Diagnoses:  Active Problems:   Chronic back pain   Acute renal failure (ARF) (H[facility])   Nausea with vomiting   Lightheaded   Volume depletion   Acute on chronic renal failure ([facility])   Nausea and vomiting in adult   Flu-like symptoms   Hypokalemia   Discharge Condition:  Much improved.  Cr 1.8, normal K, and no nausea, vomiting.   Diet recommendation: Cardiac healthy diet.   Filed Weights   08/17/15 1709 08/19/15 0520 08/20/15 0500  Weight: 63.957 kg (141 lb) 67.722 kg (149 lb 4.8 oz) 67.359 kg (148 lb 8 oz)    History of present illness:  Patient was admitted on August 17, 2015, for myalgia, nausea, vomiting, and AKI by Dr Roney Jaffe.  As per his H and P:  " Cheryl Gordon is a 60 y.o. female presenting with fevers, congestion, myalgias and n/v, for 3-4 days, fever 102 at home initially, none the last few days. Creat 4.41 , K low 2.9. Strep and flu swab are negative, negative for PNA. Lives at home. Family here. 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   No chest pain, no abd pain, no diarrhea. No bm last couple of days. No dysuria. Hx interstitial cystitis, gets UTI's every couple of months. +lightheaded, thirsty and very weak.    Hospital Course:  Patient was admitted into the hospital, and it was felt that she has pyeloneprhitis, and she was started on IV Rocephin.  She improved quickly, and with IVF and discontinuation of her NSAIDS, her Cr improved from 4.41 to 1.8.  She felt better and is anxious to go home.  She was also found to have hypokalemia, and was supplemented.  She will be discharged on  Cipro along with K supplements.  She will avoid NSAIDS.  She will follow up with her PCP, and should have another set of bloodwork to be sure her Cr continues to improve.  Thank you and Good Day.   Discharge Exam: Filed Vitals:   08/19/15 2048 08/20/15 0459  BP: 111/59 132/96  Pulse: 89 83  Temp: 100 F (37.8 C) 99.2 F (37.3 C)  Resp: 20 20   Discharge Instructions   Discharge Instructions    Diet - low sodium heart healthy    Complete by:  As directed      Increase activity slowly    Complete by:  As directed           Current Discharge Medication List    START taking these medications   Details  ciprofloxacin (CIPRO) 500 MG tablet Take 1 tablet (500 mg total) by mouth 2 (two) times daily. Qty: 14 tablet, Refills: 0      CONTINUE these medications which have NOT CHANGED   Details  acetaminophen (TYLENOL) 500 MG tablet Take 1,000 mg by mouth every 6 (six) hours as needed for moderate pain.    buprenorphine (SUBUTEX) 8 MG SUBL SL tablet Place 8 mg under the tongue daily.     cholecalciferol (VITAMIN D) 1000 units tablet Take 2,000 Units by mouth daily.    Garlic 123XX123 MG CAPS Take 2  capsules by mouth daily.    levothyroxine (SYNTHROID, LEVOTHROID) 125 MCG tablet TAKE ONE (1) TABLET EACH DAY BEFORE BREALFAST Qty: 30 tablet, Refills: 0      STOP taking these medications     ibuprofen (ADVIL,MOTRIN) 200 MG tablet      indapamide (LOZOL) 2.5 MG tablet      OVER THE COUNTER MEDICATION      promethazine (PHENERGAN) 25 MG tablet      ramipril (ALTACE) 10 MG capsule        Allergies  Allergen Reactions  . Sulfa Antibiotics Nausea And Vomiting  . Zithromax [Azithromycin] Other (See Comments)    unknown      The results of significant diagnostics from this hospitalization (including imaging, microbiology, ancillary and laboratory) are listed below for reference.    Significant Diagnostic Studies: Dg Chest 2 View  08/17/2015  CLINICAL DATA:  Cough with  possible pneumonia.  Smoker. EXAM: CHEST  2 VIEW COMPARISON:  06/01/2014 FINDINGS: Midline trachea. Normal heart size and mediastinal contours. No pleural effusion or pneumothorax. Diffuse peribronchial thickening. Clear lungs. IMPRESSION: 1.  No acute cardiopulmonary disease. 2. Mild peribronchial thickening which may relate to chronic bronchitis or smoking. Electronically Signed   By: Abigail Miyamoto [name]   On: 08/17/2015 18:10   US Renal  08/18/2015  CLINICAL DATA:  Acute renal failure EXAM: RENAL / URINARY TRACT ULTRASOUND COMPLETE COMPARISON:  02/08/2005 FINDINGS: Right Kidney: Length: 10.0 cm. Echogenicity within normal limits. No mass or hydronephrosis visualized. Left Kidney: Length: 11.3 cm. Echogenicity within normal limits. No mass or hydronephrosis visualized. Bladder: Relatively decompressed.  Bilateral ureteral jets are identified. IMPRESSION: No evidence of hydronephrosis.  Within normal limits. Electronically Signed   By: Marybelle Killings [name]   On: 08/18/2015 08:33    Microbiology: Recent Results (from the past 240 hour(s))  Rapid strep screen     Status: None   Collection Time: 08/17/15  9:10 PM  Result Value Ref Range Status   Streptococcus, Group A Screen (Direct) NEGATIVE NEGATIVE Final    Comment: (NOTE) A Rapid Antigen test may result negative if the antigen level in the sample is below the detection level of this test. The FDA has not cleared this test as a stand-alone test therefore the rapid antigen negative result has reflexed to a Group A Strep culture.   Culture, group A strep     Status: None (Preliminary result)   Collection Time: 08/17/15  9:10 PM  Result Value Ref Range Status   Specimen Description THROAT  Final   Special Requests NONE Reflexed from [id number]  Final   Culture   Final    CULTURE REINCUBATED FOR BETTER GROWTH Performed at [facility]    Report Status PENDING  Incomplete  Urine culture     Status: None   Collection Time: 08/17/15 10:40 PM   Result Value Ref Range Status   Specimen Description URINE, CLEAN CATCH  Final   Special Requests NONE  Final   Culture   Final    >=100,000 COLONIES/mL ESCHERICHIA COLI Performed at [facility]    Report Status 08/20/2015 FINAL  Final   Organism ID, Bacteria ESCHERICHIA COLI  Final      Susceptibility   Escherichia coli - MIC*    AMPICILLIN <=2 SENSITIVE Sensitive     CEFAZOLIN <=4 SENSITIVE Sensitive     CEFTRIAXONE <=1 SENSITIVE Sensitive     CIPROFLOXACIN 0.5 SENSITIVE Sensitive     GENTAMICIN <=1 SENSITIVE Sensitive  IMIPENEM <=0.25 SENSITIVE Sensitive     NITROFURANTOIN <=16 SENSITIVE Sensitive     TRIMETH/SULFA >=320 RESISTANT Resistant     AMPICILLIN/SULBACTAM <=2 SENSITIVE Sensitive     PIP/TAZO <=4 SENSITIVE Sensitive     * >=100,000 COLONIES/mL ESCHERICHIA COLI     Labs: Basic Metabolic Panel:  Recent Labs Lab 08/17/15 1736 08/18/15 0506 08/19/15 0549 08/20/15 0508  NA 132* 135 133* 135  K 2.9* 2.8* 3.1* 3.4*  CL 91* 103 104 106  CO2 28 21* 20* 22  GLUCOSE 110* 103* 128* 122*  BUN 57* 57* 43* 24*  CREATININE 4.11* 3.86* 2.83* 1.74*  CALCIUM 8.6* 7.2* 7.0* 7.4*  MG 2.0  --   --   --    Liver Function Tests:  Recent Labs Lab 08/17/15 1736 08/18/15 0506  AST 19 14*  ALT 17 15  ALKPHOS 98 88  BILITOT 0.6 0.7  PROT 7.3 5.7*  ALBUMIN 3.3* 2.5*    Recent Labs Lab 08/17/15 1736  LIPASE 10*   No results for input(s): AMMONIA in the last 168 hours. CBC:  Recent Labs Lab 08/17/15 1736 08/18/15 0506  WBC 6.6 5.5  NEUTROABS 5.6  --   HGB 10.8* 9.0*  HCT 31.6* 26.1*  MCV 87.8 87.6  PLT 184 161    Signed:  Egidio Lofgren MD. FACP [facility] 08/20/2015, 3:09 PM

## 2015-08-21 LAB — CULTURE, GROUP A STREP (THRC)

## 2015-08-23 ENCOUNTER — Other Ambulatory Visit: Payer: Self-pay | Admitting: Family Medicine

## 2015-08-26 ENCOUNTER — Encounter: Payer: Self-pay | Admitting: Family Medicine

## 2015-08-26 ENCOUNTER — Ambulatory Visit (INDEPENDENT_AMBULATORY_CARE_PROVIDER_SITE_OTHER): Payer: BC Managed Care – PPO | Admitting: Family Medicine

## 2015-08-26 VITALS — BP 110/70 | Ht 62.0 in | Wt 142.0 lb

## 2015-08-26 DIAGNOSIS — R5383 Other fatigue: Secondary | ICD-10-CM | POA: Diagnosis not present

## 2015-08-26 DIAGNOSIS — N289 Disorder of kidney and ureter, unspecified: Secondary | ICD-10-CM | POA: Diagnosis not present

## 2015-08-26 DIAGNOSIS — I10 Essential (primary) hypertension: Secondary | ICD-10-CM | POA: Diagnosis not present

## 2015-08-26 NOTE — Progress Notes (Signed)
   Subjective:    Patient ID: Cheryl Gordon, female    DOB: [birth year], 60 y.o.   MRN: [id number]  HPI Patient is here today for a hospital follow up. Patient was treated at [facility] on 08/17/15 for acute renal failure. Patient states that she is feeling a lot better. Lightheadedness noted. Patient states that she is still running a fever in the evening since she left the hospital. Highest reading has been 102.   Patient was discharged from the hospital taking Cipro but this was discontinued by her urologist and they prescribed her a different medication and patient is unsure the name of it. There is a question of a renal abscess. Currently followed by specialist.  Patient has history of mild anemia. Substantially worsen on last blood check. No history of blood loss.  Patient has history of hypertension. Claims compliance with medications. Review of Systems No headache no chest pain no back pain ROS otherwise negative    Objective:   Physical Exam  Alert vitals stable mild malaise afebrile blood pressure 110/70 lungs clear. Heart rare rhythm no significant 10      Assessment & Plan:  Impression 1 urinary tract infection/pyelonephritis question persistent abscess #2 anemia discussed will reassess CBC. Along with ferritin folic acid Hemoccult etc. #3 hypertension controlled tight blood pressure meds discussed plan adjustments made as noted in labs. Maintain other antibiotics. Warning signs discussed. PE blood work. Further recommendations based on results 25 minutes most in discussion and assessment of hospital record testing will reassess renal function also substantial elevation hospital moderate improvement last check discussed [facility]

## 2015-08-26 NOTE — Patient Instructions (Signed)
Stop ramipril. Take 1/2 tablet of indapamide.

## 2015-08-31 ENCOUNTER — Ambulatory Visit: Payer: BC Managed Care – PPO | Admitting: Family Medicine

## 2015-09-01 LAB — CBC WITH DIFFERENTIAL/PLATELET
Basophils Absolute: 0 10*3/uL (ref 0.0–0.2)
Basos: 1 %
EOS (ABSOLUTE): 0.2 10*3/uL (ref 0.0–0.4)
Eos: 4 %
Hematocrit: 31.8 % — ABNORMAL LOW (ref 34.0–46.6)
Hemoglobin: 10.2 g/dL — ABNORMAL LOW (ref 11.1–15.9)
Immature Grans (Abs): 0 10*3/uL (ref 0.0–0.1)
Immature Granulocytes: 0 %
Lymphocytes Absolute: 1.1 10*3/uL (ref 0.7–3.1)
Lymphs: 19 %
MCH: 29 pg (ref 26.6–33.0)
MCHC: 32.1 g/dL (ref 31.5–35.7)
MCV: 90 fL (ref 79–97)
Monocytes Absolute: 0.3 10*3/uL (ref 0.1–0.9)
Monocytes: 6 %
Neutrophils Absolute: 4 10*3/uL (ref 1.4–7.0)
Neutrophils: 70 %
Platelets: 487 10*3/uL — ABNORMAL HIGH (ref 150–379)
RBC: 3.52 x10E6/uL — ABNORMAL LOW (ref 3.77–5.28)
RDW: 13.5 % (ref 12.3–15.4)
WBC: 5.7 10*3/uL (ref 3.4–10.8)

## 2015-09-01 LAB — BASIC METABOLIC PANEL
BUN/Creatinine Ratio: 11 (ref 11–26)
BUN: 15 mg/dL (ref 8–27)
CO2: 26 mmol/L (ref 18–29)
Calcium: 10.2 mg/dL (ref 8.7–10.3)
Chloride: 91 mmol/L — ABNORMAL LOW (ref 96–106)
Creatinine, Ser: 1.31 mg/dL — ABNORMAL HIGH (ref 0.57–1.00)
GFR calc Af Amer: 51 mL/min/{1.73_m2} — ABNORMAL LOW (ref >59)
GFR calc non Af Amer: 44 mL/min/{1.73_m2} — ABNORMAL LOW (ref >59)
Glucose: 137 mg/dL — ABNORMAL HIGH (ref 65–99)
Potassium: 4.3 mmol/L (ref 3.5–5.2)
Sodium: 136 mmol/L (ref 134–144)

## 2015-09-01 LAB — POC HEMOCCULT BLD/STL (HOME/3-CARD/SCREEN)
Card #2 Fecal Occult Blod, POC: NEGATIVE
Card #3 Fecal Occult Blood, POC: NEGATIVE
Fecal Occult Blood, POC: NEGATIVE

## 2015-09-01 LAB — FERRITIN: Ferritin: 274 ng/mL — ABNORMAL HIGH (ref 15–150)

## 2015-09-01 LAB — FOLATE: Folate: 4.9 ng/mL (ref >3.0)

## 2015-09-15 ENCOUNTER — Other Ambulatory Visit: Payer: Self-pay | Admitting: Family Medicine

## 2015-09-23 ENCOUNTER — Ambulatory Visit (INDEPENDENT_AMBULATORY_CARE_PROVIDER_SITE_OTHER): Payer: BC Managed Care – PPO | Admitting: Family Medicine

## 2015-09-23 ENCOUNTER — Encounter: Payer: Self-pay | Admitting: Family Medicine

## 2015-09-23 VITALS — BP 122/80 | Ht 62.0 in | Wt 136.8 lb

## 2015-09-23 DIAGNOSIS — I10 Essential (primary) hypertension: Secondary | ICD-10-CM | POA: Diagnosis not present

## 2015-09-23 DIAGNOSIS — E039 Hypothyroidism, unspecified: Secondary | ICD-10-CM

## 2015-09-23 DIAGNOSIS — Z79899 Other long term (current) drug therapy: Secondary | ICD-10-CM | POA: Diagnosis not present

## 2015-09-23 DIAGNOSIS — N289 Disorder of kidney and ureter, unspecified: Secondary | ICD-10-CM | POA: Diagnosis not present

## 2015-09-23 DIAGNOSIS — R5383 Other fatigue: Secondary | ICD-10-CM | POA: Diagnosis not present

## 2015-09-23 NOTE — Progress Notes (Signed)
   Subjective:    Patient ID: Cheryl Gordon, female    DOB: May 02, 1956, 60 y.o.   MRN: [id number]  HPI Patient arrives for follow up after recent hospitalization. Patient states she is feeling better- has been following up with urology for kidney stone and abscess  122 over 80 today, the other day the low number was 88, 96 elev the other day   Hx or erthema nodusum, patient has had a couple spells of this after steroid injections and wonders if it may be related  Incredible stress: On with the family right now. Husband recently diagnosed with ALS. Patient done with substantial stress in this regard.  Patient notes fatigue, substantial in nature, thinks it may be associated with her stress levels. Claims compliance with thyroid medicine. Last time checked improved.  Patient also had substantial renal insufficiency. [place] blood work revealed improvement. Next  Patient also concerned about taking Tylenol in terms of her liver. Prior liver enzymes normal. Current pain is breaking through despite daily subcutex, pain worsening low back  nmm Review of Systems No shortness of breath no headache no abdominal pain no change bowel habits no blood in stool no current rash ROS otherwise negative    Objective:   Physical Exam  Alert vitals stable. HEENT normal blood pressure good on repeat lungs clear heart regular rhythm low back tender to percussion      Assessment & Plan:  Impression 1 hypertension control improved discussed patient to maintain efforts at diet and exercise #2 renal insufficiency discuss will evaluate in 2 months #3 Tylenol use discussed in light of patient's worries regarding the liver patient advised to definitely use #4 hypothyroidism #5 family distress plan 25 minutes spent most in discussion. Recheck in several months diet exercise discussed maintain current meds blood work week prior to next visit [facility]

## 2015-09-23 NOTE — Patient Instructions (Signed)
Blood work one week before next visit please

## 2015-09-24 ENCOUNTER — Other Ambulatory Visit: Payer: Self-pay | Admitting: Family Medicine

## 2015-10-19 ENCOUNTER — Other Ambulatory Visit: Payer: Self-pay | Admitting: Family Medicine

## 2015-11-23 ENCOUNTER — Other Ambulatory Visit: Payer: Self-pay | Admitting: Family Medicine

## 2015-12-22 LAB — BASIC METABOLIC PANEL
BUN/Creatinine Ratio: 17 (ref 12–28)
BUN: 19 mg/dL (ref 8–27)
CO2: 29 mmol/L (ref 18–29)
Calcium: 9.4 mg/dL (ref 8.7–10.3)
Chloride: 93 mmol/L — ABNORMAL LOW (ref 96–106)
Creatinine, Ser: 1.14 mg/dL — ABNORMAL HIGH (ref 0.57–1.00)
GFR calc Af Amer: 60 mL/min/{1.73_m2} (ref >59)
GFR calc non Af Amer: 52 mL/min/{1.73_m2} — ABNORMAL LOW (ref >59)
Glucose: 99 mg/dL (ref 65–99)
Potassium: 3.8 mmol/L (ref 3.5–5.2)
Sodium: 138 mmol/L (ref 134–144)

## 2015-12-22 LAB — CBC WITH DIFFERENTIAL/PLATELET
Basophils Absolute: 0 10*3/uL (ref 0.0–0.2)
Basos: 1 %
EOS (ABSOLUTE): 0.2 10*3/uL (ref 0.0–0.4)
Eos: 6 %
Hematocrit: 34 % (ref 34.0–46.6)
Hemoglobin: 11.4 g/dL (ref 11.1–15.9)
Immature Grans (Abs): 0 10*3/uL (ref 0.0–0.1)
Immature Granulocytes: 0 %
Lymphocytes Absolute: 1 10*3/uL (ref 0.7–3.1)
Lymphs: 26 %
MCH: 29.3 pg (ref 26.6–33.0)
MCHC: 33.5 g/dL (ref 31.5–35.7)
MCV: 87 fL (ref 79–97)
Monocytes Absolute: 0.3 10*3/uL (ref 0.1–0.9)
Monocytes: 9 %
Neutrophils Absolute: 2.3 10*3/uL (ref 1.4–7.0)
Neutrophils: 58 %
Platelets: 221 10*3/uL (ref 150–379)
RBC: 3.89 x10E6/uL (ref 3.77–5.28)
RDW: 15 % (ref 12.3–15.4)
WBC: 3.8 10*3/uL (ref 3.4–10.8)

## 2015-12-22 LAB — HEPATIC FUNCTION PANEL
ALT: 11 IU/L (ref 0–32)
AST: 12 IU/L (ref 0–40)
Albumin: 4.2 g/dL (ref 3.6–4.8)
Alkaline Phosphatase: 92 IU/L (ref 39–117)
Bilirubin Total: 0.3 mg/dL (ref 0.0–1.2)
Bilirubin, Direct: 0.08 mg/dL (ref 0.00–0.40)
Total Protein: 7.3 g/dL (ref 6.0–8.5)

## 2015-12-22 LAB — T4, FREE: Free T4: 1.57 ng/dL (ref 0.82–1.77)

## 2015-12-22 LAB — TSH: TSH: 1 u[IU]/mL (ref 0.450–4.500)

## 2015-12-27 ENCOUNTER — Ambulatory Visit (INDEPENDENT_AMBULATORY_CARE_PROVIDER_SITE_OTHER): Payer: BC Managed Care – PPO | Admitting: Family Medicine

## 2015-12-27 ENCOUNTER — Encounter: Payer: Self-pay | Admitting: Family Medicine

## 2015-12-27 VITALS — BP 136/86 | Ht 62.0 in | Wt 134.2 lb

## 2015-12-27 DIAGNOSIS — I10 Essential (primary) hypertension: Secondary | ICD-10-CM

## 2015-12-27 DIAGNOSIS — E039 Hypothyroidism, unspecified: Secondary | ICD-10-CM

## 2015-12-27 DIAGNOSIS — R5383 Other fatigue: Secondary | ICD-10-CM

## 2015-12-27 DIAGNOSIS — N289 Disorder of kidney and ureter, unspecified: Secondary | ICD-10-CM

## 2015-12-27 MED ORDER — LEVOTHYROXINE SODIUM 125 MCG PO TABS: ORAL_TABLET | ORAL | 5 refills | Status: DC

## 2015-12-27 MED ORDER — INDAPAMIDE 2.5 MG PO TABS: 1.2500 mg | ORAL_TABLET | Freq: Every day | ORAL | 5 refills | Status: DC

## 2015-12-27 MED ORDER — POTASSIUM CHLORIDE ER 10 MEQ PO TBCR: 10.0000 meq | EXTENDED_RELEASE_TABLET | Freq: Two times a day (BID) | ORAL | 5 refills | Status: DC

## 2015-12-27 NOTE — Progress Notes (Signed)
Subjective:    Patient ID: Cheryl Gordon, female    DOB: 01-Apr-1956, 60 y.o.   MRN: [id number] Patient arrives office with numerous concerns Hypertension  This is a chronic problem. The current episode started more than 1 year ago. Risk factors for coronary artery disease include post-menopausal state. Treatments tried: lozol. There are no compliance problems.     Results for orders placed or performed in visit on 09/23/15  Hepatic function panel  Result Value Ref Range   Total Protein 7.3 6.0 - 8.5 g/dL   Albumin 4.2 3.6 - 4.8 g/dL   Bilirubin Total 0.3 0.0 - 1.2 mg/dL   Bilirubin, Direct 0.08 0.00 - 0.40 mg/dL   Alkaline Phosphatase 92 39 - 117 IU/L   AST 12 0 - 40 IU/L   ALT 11 0 - 32 IU/L  Basic metabolic panel  Result Value Ref Range   Glucose 99 65 - 99 mg/dL   BUN 19 8 - 27 mg/dL   Creatinine, Ser 1.14 (H) 0.57 - 1.00 mg/dL   GFR calc non Af Amer 52 (L) >59 mL/min/1.73   GFR calc Af Amer 60 >59 mL/min/1.73   BUN/Creatinine Ratio 17 12 - 28   Sodium 138 134 - 144 mmol/L   Potassium 3.8 3.5 - 5.2 mmol/L   Chloride 93 (L) 96 - 106 mmol/L   CO2 29 18 - 29 mmol/L   Calcium 9.4 8.7 - 10.3 mg/dL  CBC with Differential/Platelet  Result Value Ref Range   WBC 3.8 3.4 - 10.8 x10E3/uL   RBC 3.89 3.77 - 5.28 x10E6/uL   Hemoglobin 11.4 11.1 - 15.9 g/dL   Hematocrit 34.0 34.0 - 46.6 %   MCV 87 79 - 97 fL   MCH 29.3 26.6 - 33.0 pg   MCHC 33.5 31.5 - 35.7 g/dL   RDW 15.0 12.3 - 15.4 %   Platelets 221 150 - 379 x10E3/uL   Neutrophils 58 %   Lymphs 26 %   Monocytes 9 %   Eos 6 %   Basos 1 %   Neutrophils Absolute 2.3 1.4 - 7.0 x10E3/uL   Lymphocytes Absolute 1.0 0.7 - 3.1 x10E3/uL   Monocytes Absolute 0.3 0.1 - 0.9 x10E3/uL   EOS (ABSOLUTE) 0.2 0.0 - 0.4 x10E3/uL   Basophils Absolute 0.0 0.0 - 0.2 x10E3/uL   Immature Granulocytes 0 %   Immature Grans (Abs) 0.0 0.0 - 0.1 x10E3/uL  T4, free  Result Value Ref Range   Free T4 1.57 0.82 - 1.77 ng/dL  TSH  Result Value Ref  Range   TSH 1.000 0.450 - 4.500 uIU/mL   BP numbers exrcise very busy   Claims compliance with thyroid medication. Some fatigue at times it may be due to stress. Does not miss a dose. No obvious side effects. Prior blood work in current blood work reviewed.  Ongoing considerable stress with family member very sick with advanced ALS. Having all kinds of challenges in this regard. Does not want any additional medication at this time.  Experiencing some more back pain lately Discuss recent lab work  Review of Systems No headache, no major weight loss or weight gain, no chest pain no back pain abdominal pain no change in bowel habits complete ROS otherwise negative     Objective:   Physical Exam Alert vitals stable blood pressure good on repeat HEENT normal. Lungs clear. Heart regular in rhythm. Ankles without edema neuro intact.       Assessment & Plan:  Impression  1 hypothyroidism good control discussed maintain same #2 hypertension good control compliance discussed diet discussed maintain same dose #3 fatigue likely secondary to ongoing stress discussed at length hold off on further blood work at this time. 25 minutes spent most in discussion diet exercise discussed. Also of note prior renal insufficiency has pretty much completely resolved which of course is good news W[facility]

## 2016-01-06 ENCOUNTER — Other Ambulatory Visit: Payer: Self-pay | Admitting: *Deleted

## 2016-01-06 DIAGNOSIS — Z79899 Other long term (current) drug therapy: Secondary | ICD-10-CM

## 2016-01-10 ENCOUNTER — Encounter: Payer: Self-pay | Admitting: Family Medicine

## 2016-01-10 ENCOUNTER — Other Ambulatory Visit: Payer: Self-pay | Admitting: Gastroenterology

## 2016-01-17 ENCOUNTER — Other Ambulatory Visit: Payer: Self-pay | Admitting: Family Medicine

## 2016-02-10 ENCOUNTER — Other Ambulatory Visit: Payer: Self-pay | Admitting: Family Medicine

## 2016-02-14 ENCOUNTER — Telehealth: Payer: Self-pay | Admitting: Family Medicine

## 2016-02-14 NOTE — Telephone Encounter (Signed)
Patient wants to know if the blood work was ordered for her to recheck her potassium.  She says she has been having weak spells and would like to check and see if it is because of her potassium.

## 2016-02-14 NOTE — Telephone Encounter (Signed)
Notified patient that bloodwork order is in the system to have a met 7 done. Patient is going to lab today to have this drawn.

## 2016-02-14 NOTE — Telephone Encounter (Signed)
ok 

## 2016-02-15 ENCOUNTER — Telehealth: Payer: Self-pay | Admitting: Family Medicine

## 2016-02-15 NOTE — Telephone Encounter (Signed)
Review blood work results from [facility] in results folder. °

## 2016-02-16 NOTE — Telephone Encounter (Signed)
Whichever nurse gets this please answer the following question: Why was this faxed and gernating a message from the front rather than come directly thru "results"? This seems to be happening more and I'm concerned this increases the risk of blood work falling through the cracks> Let me know answer today please  Notify pt, potassium fine, kidney fuction still good, sugatr up a bit but not fasting so wnl

## 2016-02-16 NOTE — Telephone Encounter (Signed)
Notified patient potassium fine, kidney fuction still good, sugar up a bit but not fasting so wnl. Patient verbalized understanding.  Order was put in the system. Not sure why [facility] decided to fax results instead of putting them into Epic. They have been questioned about this in the past and we can never get a clear answer as to why. Maybe someone higher up needs to be contacted.

## 2016-02-16 NOTE — Telephone Encounter (Signed)
Appt scheduled

## 2016-02-16 NOTE — Telephone Encounter (Signed)
Patient called and said she is still having the weak spells and wants to know if she can be worked in the office tomorrow to see someone.

## 2016-02-16 NOTE — Telephone Encounter (Signed)
Patient can have appointment tomorrow with Hoyle Sauer.

## 2016-02-17 ENCOUNTER — Encounter: Payer: Self-pay | Admitting: Nurse Practitioner

## 2016-02-17 ENCOUNTER — Ambulatory Visit (INDEPENDENT_AMBULATORY_CARE_PROVIDER_SITE_OTHER): Payer: BC Managed Care – PPO | Admitting: Nurse Practitioner

## 2016-02-17 VITALS — BP 124/82 | Ht 62.0 in | Wt 139.2 lb

## 2016-02-17 DIAGNOSIS — R5383 Other fatigue: Secondary | ICD-10-CM | POA: Diagnosis not present

## 2016-02-17 DIAGNOSIS — R300 Dysuria: Secondary | ICD-10-CM

## 2016-02-17 DIAGNOSIS — R55 Syncope and collapse: Secondary | ICD-10-CM

## 2016-02-17 LAB — POCT URINALYSIS DIPSTICK
Spec Grav, UA: 1.015
pH, UA: 6

## 2016-02-17 LAB — POCT GLYCOSYLATED HEMOGLOBIN (HGB A1C): Hemoglobin A1C: 5.3

## 2016-02-19 LAB — URINE CULTURE

## 2016-02-21 ENCOUNTER — Encounter: Payer: Self-pay | Admitting: Nurse Practitioner

## 2016-02-21 NOTE — Progress Notes (Addendum)
Subjective:  Presents to discuss some episodes that began about 2 weeks ago. The first episode that time she felt shaky, nausea and week, had not eaten on a regular basis. Resolved after she had something to keep. Last Thursday had an episode that lasted for several days. Generalized malaise. Extreme fatigue. Was eating fine at that point. No fevers. Does not sleep well, has early morning awakenings. Has been under tremendous stress. Her husband has ALS. No chest pain/ischemic type pain shortness of breath or edema. No palpitations. No unusual headaches. No visual changes. No difficulty speaking or swallowing. No numbness or weakness of the face arms or legs. Patient states she increased her sleep for several days and feels much better today. Has experienced some urinary frequency, no dysuria. No fever. No pelvic pain. No back pain.  Objective:   BP 124/82   Ht 5\' 2"  (1.575 m)   Wt 139 lb 3.2 oz (63.1 kg)   BMI 25.46 kg/m  NAD. Alert, oriented. Thoughts logical coherent and relevant. Calm affect. Dressed appropriately. TMs normal limit. Pharynx clear. Neck supple with minimal adenopathy. Lungs clear. Heart regular rate rhythm. Abdomen soft nondistended nontender. No CVA tenderness. EKG normal. UA and urine micro-negative. A1c 5.3.  Assessment: Dysuria - Plan: POCT urinalysis dipstick, Urine culture  Other fatigue - Plan: POCT glycosylated hemoglobin (Hb A1C), PR ELECTROCARDIOGRAM, COMPLETE  Near syncope - Plan: PR ELECTROCARDIOGRAM, COMPLETE   Plan: Feel symptoms are most likely related to extreme stress and exhaustion. Symptoms resolved once patient had adequate rest. Discussed importance of stress reduction. Warning signs reviewed. Patient to call back if any further problems. Urine culture pending. Over 50% of this visit was spent in discussion and counseling regarding her stress.

## 2016-02-22 ENCOUNTER — Encounter: Payer: Self-pay | Admitting: Family Medicine

## 2016-04-13 ENCOUNTER — Other Ambulatory Visit: Payer: Self-pay | Admitting: Family Medicine

## 2016-06-15 ENCOUNTER — Telehealth: Payer: Self-pay | Admitting: Family Medicine

## 2016-06-15 DIAGNOSIS — E039 Hypothyroidism, unspecified: Secondary | ICD-10-CM

## 2016-06-15 DIAGNOSIS — N289 Disorder of kidney and ureter, unspecified: Secondary | ICD-10-CM

## 2016-06-15 DIAGNOSIS — R739 Hyperglycemia, unspecified: Secondary | ICD-10-CM

## 2016-06-15 NOTE — Telephone Encounter (Signed)
Requesting blood work to be ordered for appointment on 06/20/16.

## 2016-06-15 NOTE — Telephone Encounter (Signed)
Lipid/liver/met7/A1C/TSH/freeT4-hypothyroid-hyperlip-renal insuff-hyperglycemia

## 2016-06-16 DIAGNOSIS — N289 Disorder of kidney and ureter, unspecified: Secondary | ICD-10-CM | POA: Insufficient documentation

## 2016-06-16 DIAGNOSIS — R739 Hyperglycemia, unspecified: Secondary | ICD-10-CM | POA: Insufficient documentation

## 2016-06-16 NOTE — Telephone Encounter (Signed)
LMTCB

## 2016-06-20 ENCOUNTER — Encounter: Payer: Self-pay | Admitting: Family Medicine

## 2016-06-20 ENCOUNTER — Ambulatory Visit (INDEPENDENT_AMBULATORY_CARE_PROVIDER_SITE_OTHER): Payer: BC Managed Care – PPO | Admitting: Family Medicine

## 2016-06-20 VITALS — BP 124/86 | Ht 62.0 in | Wt 147.0 lb

## 2016-06-20 DIAGNOSIS — E039 Hypothyroidism, unspecified: Secondary | ICD-10-CM

## 2016-06-20 DIAGNOSIS — F4320 Adjustment disorder, unspecified: Secondary | ICD-10-CM | POA: Diagnosis not present

## 2016-06-20 DIAGNOSIS — F4321 Adjustment disorder with depressed mood: Secondary | ICD-10-CM

## 2016-06-20 DIAGNOSIS — I10 Essential (primary) hypertension: Secondary | ICD-10-CM

## 2016-06-20 DIAGNOSIS — R5383 Other fatigue: Secondary | ICD-10-CM

## 2016-06-20 MED ORDER — LEVOTHYROXINE SODIUM 125 MCG PO TABS: ORAL_TABLET | ORAL | 5 refills | Status: DC

## 2016-06-20 MED ORDER — POTASSIUM CHLORIDE ER 10 MEQ PO TBCR: 10.0000 meq | EXTENDED_RELEASE_TABLET | Freq: Two times a day (BID) | ORAL | 5 refills | Status: DC

## 2016-06-20 MED ORDER — INDAPAMIDE 2.5 MG PO TABS: 1.2500 mg | ORAL_TABLET | Freq: Every day | ORAL | 5 refills | Status: DC

## 2016-06-20 NOTE — Telephone Encounter (Addendum)
Patient was notified that blood work was ordered. Patient verbalized understanding.

## 2016-06-20 NOTE — Progress Notes (Signed)
   Subjective:    Patient ID: Cheryl Gordon, female    DOB: Feb 17, 1956, 61 y.o.   MRN: [id number] Patient arrives office with numerous concerns Hypertension  This is a chronic problem. The current episode started more than 1 year ago. Treatments tried: lozol. Compliance problems include exercise.   Blood pressure medicine and blood pressure levels reviewed today with patient. Compliant with blood pressure medicine. States does not miss a dose. No obvious side effects. Blood pressure generally good when checked elsewhere. Watching salt intake.  Patient has history of chronic IBS. This is definitely flared up in the past month. Pt having diarrhea since husband's death.    Patient experiencing understandable grief. Her husband just passed away from [place]. Things were very difficult at home and in no final months. Hospice were closed with patient. Patient goes into great length on her level of distress and strain.  Review of Systems No headache, no major weight loss or weight gain, no chest pain no back pain abdominal pain no change in bowel habits complete ROS otherwise negative     Objective:   Physical Exam  Alert vitals stable, NAD. Blood pressure good on repeat. HEENT normal. Lungs clear. Heart regular rate and rhythm.       Assessment & Plan:  Impression 1 hypertension good control discussed to maintain same meds #2 hypothyroidism prior results discussed maintain same #3 chronic IBS with current flare. Discussed. Patient using occasional Imodium would like to stick with [place] #4 grief. Patient not ready for antidepressants. She understands this is normal process of grief. Patient advised warning signs in this regard. Easily 25 minutes spent most in discussion. Medications refilled. Follow-up in 6 months WSL

## 2016-06-29 LAB — BASIC METABOLIC PANEL
BUN/Creatinine Ratio: 19 (ref 12–28)
BUN: 25 mg/dL (ref 8–27)
CO2: 29 mmol/L (ref 18–29)
Calcium: 9.8 mg/dL (ref 8.7–10.3)
Chloride: 98 mmol/L (ref 96–106)
Creatinine, Ser: 1.29 mg/dL — ABNORMAL HIGH (ref 0.57–1.00)
GFR calc Af Amer: 52 mL/min/{1.73_m2} — ABNORMAL LOW (ref >59)
GFR calc non Af Amer: 45 mL/min/{1.73_m2} — ABNORMAL LOW (ref >59)
Glucose: 96 mg/dL (ref 65–99)
Potassium: 4.3 mmol/L (ref 3.5–5.2)
Sodium: 140 mmol/L (ref 134–144)

## 2016-06-29 LAB — LIPID PANEL
Chol/HDL Ratio: 4.5 ratio units — ABNORMAL HIGH (ref 0.0–4.4)
Cholesterol, Total: 222 mg/dL — ABNORMAL HIGH (ref 100–199)
HDL: 49 mg/dL (ref >39)
LDL Calculated: 159 mg/dL — ABNORMAL HIGH (ref 0–99)
Triglycerides: 70 mg/dL (ref 0–149)
VLDL Cholesterol Cal: 14 mg/dL (ref 5–40)

## 2016-06-29 LAB — HEPATIC FUNCTION PANEL
ALT: 20 IU/L (ref 0–32)
AST: 17 IU/L (ref 0–40)
Albumin: 4.5 g/dL (ref 3.6–4.8)
Alkaline Phosphatase: 79 IU/L (ref 39–117)
Bilirubin Total: 0.3 mg/dL (ref 0.0–1.2)
Bilirubin, Direct: 0.09 mg/dL (ref 0.00–0.40)
Total Protein: 7.3 g/dL (ref 6.0–8.5)

## 2016-06-29 LAB — T4, FREE: Free T4: 1.17 ng/dL (ref 0.82–1.77)

## 2016-06-29 LAB — TSH: TSH: 28.54 u[IU]/mL — ABNORMAL HIGH (ref 0.450–4.500)

## 2016-07-05 ENCOUNTER — Other Ambulatory Visit: Payer: Self-pay

## 2016-07-05 DIAGNOSIS — E039 Hypothyroidism, unspecified: Secondary | ICD-10-CM

## 2016-08-10 ENCOUNTER — Other Ambulatory Visit: Payer: Self-pay | Admitting: Family Medicine

## 2016-08-26 LAB — TSH: TSH: 5.35 u[IU]/mL — ABNORMAL HIGH (ref 0.450–4.500)

## 2016-08-30 ENCOUNTER — Other Ambulatory Visit: Payer: Self-pay | Admitting: *Deleted

## 2016-08-30 DIAGNOSIS — E039 Hypothyroidism, unspecified: Secondary | ICD-10-CM

## 2016-08-30 MED ORDER — LEVOTHYROXINE SODIUM 137 MCG PO TABS: ORAL_TABLET | ORAL | 5 refills | Status: DC

## 2016-11-14 ENCOUNTER — Ambulatory Visit (INDEPENDENT_AMBULATORY_CARE_PROVIDER_SITE_OTHER): Payer: BC Managed Care – PPO | Admitting: Family Medicine

## 2016-11-14 ENCOUNTER — Encounter: Payer: Self-pay | Admitting: Family Medicine

## 2016-11-14 VITALS — BP 148/90 | Ht 62.0 in | Wt 148.0 lb

## 2016-11-14 DIAGNOSIS — M7061 Trochanteric bursitis, right hip: Secondary | ICD-10-CM

## 2016-11-14 DIAGNOSIS — M7062 Trochanteric bursitis, left hip: Secondary | ICD-10-CM | POA: Diagnosis not present

## 2016-11-14 MED ORDER — PREDNISONE 10 MG PO TABS: ORAL_TABLET | ORAL | 0 refills | Status: DC

## 2016-11-14 NOTE — Patient Instructions (Signed)
Trochanteric Bursitis Trochanteric bursitis is a condition that causes hip pain. Trochanteric bursitis happens when fluid-filled sacs (bursae) in the hip get irritated. Normally these sacs absorb shock and help strong bands of tissue (tendons) in your hip glide smoothly over each other and over your hip bones. What are the causes? This condition results from increased friction between the hip bones and the tendons that go over them. This condition can happen if you:  Have weak hips.  Use your hip muscles too much (overuse).  Get hit in the hip.  What increases the risk? This condition is more likely to develop in:  Women.  Adults who are middle-aged or older.  People with arthritis or a spinal condition.  People with weak buttocks muscles (gluteal muscles).  People who have one leg that is shorter than the other.  People who participate in certain kinds of athletic activities, such as: ? Running sports, especially long-distance running. ? Contact sports, like football or martial arts. ? Sports in which falls may occur, like skiing.  What are the signs or symptoms? The main symptom of this condition is pain and tenderness over the point of your hip. The pain may be:  Sharp and intense.  Dull and achy.  Felt on the outside of your thigh.  It may increase when you:  Lie on your side.  Walk or run.  Go up on stairs.  Sit.  Stand up after sitting.  Stand for long periods of time.  How is this diagnosed? This condition may be diagnosed based on:  Your symptoms.  Your medical history.  A physical exam.  Imaging tests, such as: ? X-rays to check your bones. ? An MRI or ultrasound to check your tendons and muscles.  During your physical exam, your health care provider will check the movement and strength of your hip. He or she may press on the point of your hip to check for pain. How is this treated? This condition may be treated by:  Resting.  Reducing  your activity.  Avoiding activities that cause pain.  Using crutches, a cane, or a walker to decrease the strain on your hip.  Taking medicine to help with swelling.  Having medicine injected into the bursae to help with swelling.  Using ice, heat, and massage therapy for pain relief.  Physical therapy exercises for strength and flexibility.  Surgery (rare).  Follow these instructions at home: Activity  Rest.  Avoid activities that cause pain.  Return to your normal activities as told by your health care provider. Ask your health care provider what activities are safe for you. Managing pain, stiffness, and swelling  Take over-the-counter and prescription medicines only as told by your health care provider.  If directed, apply heat to the injured area as told by your health care provider. ? Place a towel between your skin and the heat source. ? Leave the heat on for 20-30 minutes. ? Remove the heat if your skin turns bright red. This is especially important if you are unable to feel pain, heat, or cold. You may have a greater risk of getting burned.  If directed, apply ice to the injured area: ? Put ice in a plastic bag. ? Place a towel between your skin and the bag. ? Leave the ice on for 20 minutes, 2-3 times a day. General instructions  If the affected leg is one that you use for driving, ask your health care provider when it is safe to drive.    Use crutches, a cane, or a walker as told by your health care provider.  If one of your legs is shorter than the other, get fitted for a shoe insert.  Lose weight if you are overweight. How is this prevented?  Wear supportive footwear that is appropriate for your sport.  If you have hip pain, start any new exercise or sport slowly.  Maintain physical fitness, including: ? Strength. ? Flexibility. Contact a health care provider if:  Your pain does not improve with 2-4 weeks. Get help right away if:  You develop  severe pain.  You have a fever.  You develop increased redness over your hip.  You have a change in your bowel function or bladder function.  You cannot control the muscles in your feet. This information is not intended to replace advice given to you by your health care provider. Make sure you discuss any questions you have with your health care provider. Document Released: 06/29/2004 Document Revised: 01/26/2016 Document Reviewed: 05/07/2015 Elsevier Interactive Patient Education  2018 Elsevier Inc.  

## 2016-11-14 NOTE — Progress Notes (Signed)
   Subjective:    Patient ID: Cheryl Gordon, female    DOB: 08-22-1955, 61 y.o.   MRN: [id number]  Leg Pain   Incident onset: 2 months. The pain is present in the left leg and right leg. She has tried acetaminophen and NSAIDs for the symptoms.   Pt was taking tylenol twice per day for the leg pain  Now working with the chiriopractor  Has substntially helped the back pain  Pain seems to start in the joints of the hip then runs down the leg   Pt saw dr elsner yrs ago, d'ed with scoliosis, given a bk injection,  Then abeezabo gave pt an injection  Pt develop erythema nosdusum after an injetion  Pt also notes erythema nodosum bumps after a shoulder steroid injection   Always in the hips and then radiates down into the legs, pt thinks fom hips  Trouble walking even into the store  Dr March [facility] psychiatry     Review of Systems No headache, no major weight loss or weight gain, no chest pain no back pain abdominal pain no change in bowel habits complete ROS otherwise negative     Objective:   Physical Exam  Alert vitals stable, NAD. Blood pressure good on repeat. HEENT normal. Lungs clear. Heart regular rate and rhythm.  Good range of motion hips distinct tenderness to palpation trochanteric bursa     Assessment & Plan:  Impression trochanteric bursitis plan prednisone taper. Seen physical therapist encouraged to talk to them about exercises. Receives Subutex from specialists will need to discuss with them about potential pain control.

## 2016-11-24 ENCOUNTER — Ambulatory Visit (INDEPENDENT_AMBULATORY_CARE_PROVIDER_SITE_OTHER): Payer: BC Managed Care – PPO | Admitting: Nurse Practitioner

## 2016-11-24 VITALS — Temp 98.1°F | Ht 63.0 in | Wt 150.0 lb

## 2016-11-24 DIAGNOSIS — N390 Urinary tract infection, site not specified: Secondary | ICD-10-CM | POA: Diagnosis not present

## 2016-11-24 LAB — POCT UA - MICROSCOPIC ONLY
Bacteria, U Microscopic: POSITIVE
RBC, urine, microscopic: NEGATIVE

## 2016-11-24 LAB — POCT URINALYSIS DIPSTICK
Nitrite, UA: POSITIVE
Spec Grav, UA: 1.015 (ref 1.010–1.025)
pH, UA: 7 (ref 5.0–8.0)

## 2016-11-24 MED ORDER — CIPROFLOXACIN HCL 500 MG PO TABS: 500.0000 mg | ORAL_TABLET | Freq: Two times a day (BID) | ORAL | 0 refills | Status: DC

## 2016-11-24 NOTE — Patient Instructions (Signed)
Take AZO as directed

## 2016-11-25 ENCOUNTER — Encounter: Payer: Self-pay | Admitting: Nurse Practitioner

## 2016-11-25 NOTE — Progress Notes (Signed)
Subjective:  Presents for c/o urinary symptoms for the past 2 days. No fever. Dysuria, urgency and frequency. No recent UTI. No back or pelvic pain. No discharge. No new sexual partners.   Objective:   Temp 98.1 F (36.7 C) (Oral)   Ht 5\' 3"  (1.6 m)   Wt 150 lb (68 kg)   BMI 26.57 kg/m  NAD. Alert, oriented. Lungs clear. Heart RRR. No CVA tenderness. Abdomen soft, with mild suprapubic area tenderness.  Results for orders placed or performed in visit on 11/24/16  POCT urinalysis dipstick  Result Value Ref Range   Color, UA     Clarity, UA     Glucose, UA     Bilirubin, UA     Ketones, UA     Spec Grav, UA 1.015 1.010 - 1.025   Blood, UA     pH, UA 7.0 5.0 - 8.0   Protein, UA     Urobilinogen, UA  0.2 or 1.0 E.U./dL   Nitrite, UA positive    Leukocytes, UA Moderate (2+) (A) Negative  POCT UA - Microscopic Only  Result Value Ref Range   WBC, Ur, HPF, POC 5+    RBC, urine, microscopic neg    Bacteria, U Microscopic pos    Mucus, UA     Epithelial cells, urine per micros rare    Crystals, Ur, HPF, POC     Casts, Ur, LPF, POC     Yeast, UA     Clumps of WBC under microscopic exam.   Assessment:  Lower urinary tract infection - Plan: POCT urinalysis dipstick, POCT UA - Microscopic Only, Urine Culture    Plan:   Meds ordered this encounter  Medications  . ciprofloxacin (CIPRO) 500 MG tablet    Sig: Take 1 tablet (500 mg total) by mouth 2 (two) times daily.    Dispense:  14 tablet    Refill:  0    Order Specific Question:   Supervising Provider    Answer:   Mikey Kirschner [[id number]]   Take AZO for 48 hours as directed prn then DC. Warning signs review. Seek help over the weekend if worse, call next week if no better.

## 2016-11-27 ENCOUNTER — Encounter: Payer: Self-pay | Admitting: Nurse Practitioner

## 2016-11-27 ENCOUNTER — Telehealth: Payer: Self-pay | Admitting: Nurse Practitioner

## 2016-11-27 NOTE — Telephone Encounter (Signed)
Spoke with patient and patient stated that she does not have any fevers or back pain. Still has burning with urination, urinary frequency, muscle aches and fatigue.

## 2016-11-27 NOTE — Telephone Encounter (Signed)
Sent a note through my chart 

## 2016-11-27 NOTE — Telephone Encounter (Signed)
Patient seen Cheryl Gordon on 11/24/16.  She said the antibiotic she was prescribed hasn't helped her, but the AZO helped her a little bit.  She wants to know the results to her culture and what Cheryl Gordon recommends.

## 2016-11-27 NOTE — Telephone Encounter (Signed)
We got a note that culture is still processing. Is she any worse? Fever? Back pain?

## 2016-11-28 ENCOUNTER — Telehealth: Payer: Self-pay | Admitting: Nurse Practitioner

## 2016-11-28 NOTE — Telephone Encounter (Signed)
Please call her and read her my chart message. She is being treated correctly for the bacteria that was identified but there may be a second one that we are waiting on. Also, the dysuria could be from vaginal issues. Thanks.

## 2016-11-28 NOTE — Telephone Encounter (Signed)
Patient called to check on urine culture. I read response that was sent via mychart to the patient. Patient states that she still has two days left on the Cipro and she still has burning on urination. Patient requests that we call her because she has not used mychart in over a year due to not knowing the password.

## 2016-11-29 ENCOUNTER — Other Ambulatory Visit: Payer: Self-pay | Admitting: Nurse Practitioner

## 2016-11-29 LAB — URINE CULTURE

## 2016-11-29 MED ORDER — FLUCONAZOLE 150 MG PO TABS
ORAL_TABLET | ORAL | 0 refills | Status: DC
Start: 2016-11-29 — End: 2016-12-07

## 2016-11-29 NOTE — Telephone Encounter (Signed)
She is aware of culture results. Cipro should cover both. See other message.

## 2016-11-29 NOTE — Telephone Encounter (Signed)
Mychart was read to patient on yesterday. She stated that she is still having the burning so she was wondering if even though the culture shows that the bacteria is sensitive to the cipro if she needed something different. She stated that she is almost out of the antibiotics. Please advise?

## 2016-12-07 ENCOUNTER — Other Ambulatory Visit: Payer: Self-pay | Admitting: Nurse Practitioner

## 2016-12-14 ENCOUNTER — Telehealth: Payer: Self-pay | Admitting: Family Medicine

## 2016-12-14 DIAGNOSIS — E785 Hyperlipidemia, unspecified: Secondary | ICD-10-CM

## 2016-12-14 DIAGNOSIS — E039 Hypothyroidism, unspecified: Secondary | ICD-10-CM

## 2016-12-14 DIAGNOSIS — N289 Disorder of kidney and ureter, unspecified: Secondary | ICD-10-CM

## 2016-12-14 NOTE — Telephone Encounter (Signed)
tsh lip liv m7

## 2016-12-14 NOTE — Telephone Encounter (Signed)
Requesting orders for labs.  She has an appointment on 12/18/16 with Dr. Richardson Landry.

## 2016-12-14 NOTE — Telephone Encounter (Signed)
Patient has an appointment scheduled here with Dr.Steve July 16,2018 would like to have labs drawn prior to the appointment. She had Lipid, Hepatic function panel, Bmet, Tsh, T4 free done January 24,2018. Please advise.Thanks

## 2016-12-15 NOTE — Telephone Encounter (Signed)
Pt.notified

## 2016-12-15 NOTE — Telephone Encounter (Signed)
Left message return call 12/15/16

## 2016-12-18 ENCOUNTER — Ambulatory Visit (INDEPENDENT_AMBULATORY_CARE_PROVIDER_SITE_OTHER): Payer: BC Managed Care – PPO | Admitting: Family Medicine

## 2016-12-18 ENCOUNTER — Encounter: Payer: Self-pay | Admitting: Family Medicine

## 2016-12-18 VITALS — BP 136/94 | Ht 62.0 in | Wt 147.4 lb

## 2016-12-18 DIAGNOSIS — N289 Disorder of kidney and ureter, unspecified: Secondary | ICD-10-CM | POA: Diagnosis not present

## 2016-12-18 DIAGNOSIS — R5383 Other fatigue: Secondary | ICD-10-CM

## 2016-12-18 DIAGNOSIS — I10 Essential (primary) hypertension: Secondary | ICD-10-CM

## 2016-12-18 MED ORDER — LEVOTHYROXINE SODIUM 137 MCG PO TABS: ORAL_TABLET | ORAL | 5 refills | Status: DC

## 2016-12-18 MED ORDER — INDAPAMIDE 2.5 MG PO TABS: 2.5000 mg | ORAL_TABLET | Freq: Every day | ORAL | 5 refills | Status: DC

## 2016-12-18 MED ORDER — POTASSIUM CHLORIDE ER 10 MEQ PO TBCR: 10.0000 meq | EXTENDED_RELEASE_TABLET | Freq: Two times a day (BID) | ORAL | 5 refills | Status: DC

## 2016-12-18 NOTE — Progress Notes (Signed)
   Subjective:    Patient ID: Cheryl Gordon, female    DOB: 10/07/1955, 61 y.o.   MRN: [id number]  Hypertension  This is a chronic problem. The current episode started more than 1 year ago.   Patient states no concerns this visit.  Takes meds faithfull  Watching the numbdrs best she can  Bottom number above 80  pred finished hips overall much better after the pred taper, only mild pain in the left lower knee handled well by tylenol   Blood pressure medicine and blood pressure levels reviewed today with patient. Compliant with blood pressure medicine. States does not miss a dose. No obvious side effects. Blood pressure generally good when checked elsewhere. Watching salt intake.    Review of Systems No headache, no major weight loss or weight gain, no chest pain no back pain abdominal pain no change in bowel habits complete ROS otherwise negative     Objective:   Physical Exam  Alert vitals stable, NAD. Blood pressure good on repeat. HEENT normal. Lungs clear. Heart regular rate and rhythm.       Assessment & Plan:  Impression hypertension good control discussed maintain same meds plan diet discussed exercise discussed medications refilled follow-up in 6 months

## 2016-12-19 ENCOUNTER — Telehealth: Payer: Self-pay | Admitting: Family Medicine

## 2016-12-19 LAB — HEPATIC FUNCTION PANEL
ALT: 13 IU/L (ref 0–32)
AST: 14 IU/L (ref 0–40)
Albumin: 4.8 g/dL (ref 3.6–4.8)
Alkaline Phosphatase: 73 IU/L (ref 39–117)
Bilirubin Total: 0.3 mg/dL (ref 0.0–1.2)
Bilirubin, Direct: 0.08 mg/dL (ref 0.00–0.40)
Total Protein: 7.2 g/dL (ref 6.0–8.5)

## 2016-12-19 LAB — BASIC METABOLIC PANEL
BUN/Creatinine Ratio: 16 (ref 12–28)
BUN: 14 mg/dL (ref 8–27)
CO2: 23 mmol/L (ref 20–29)
Calcium: 9.9 mg/dL (ref 8.7–10.3)
Chloride: 97 mmol/L (ref 96–106)
Creatinine, Ser: 0.88 mg/dL (ref 0.57–1.00)
GFR calc Af Amer: 81 mL/min/{1.73_m2} (ref >59)
GFR calc non Af Amer: 71 mL/min/{1.73_m2} (ref >59)
Glucose: 99 mg/dL (ref 65–99)
Potassium: 4.3 mmol/L (ref 3.5–5.2)
Sodium: 142 mmol/L (ref 134–144)

## 2016-12-19 LAB — LIPID PANEL
Chol/HDL Ratio: 4.8 ratio — ABNORMAL HIGH (ref 0.0–4.4)
Cholesterol, Total: 191 mg/dL (ref 100–199)
HDL: 40 mg/dL (ref >39)
LDL Calculated: 125 mg/dL — ABNORMAL HIGH (ref 0–99)
Triglycerides: 129 mg/dL (ref 0–149)
VLDL Cholesterol Cal: 26 mg/dL (ref 5–40)

## 2016-12-19 LAB — TSH: TSH: 0.811 u[IU]/mL (ref 0.450–4.500)

## 2016-12-19 NOTE — Telephone Encounter (Signed)
Received a phone call from [facility] stating that patient's phenergan and potassium interact causing upper gastro injury. Did you want the patient to continue taking both?

## 2016-12-19 NOTE — Telephone Encounter (Signed)
yes

## 2016-12-20 NOTE — Telephone Encounter (Signed)
Notified pharmacist at [facility].

## 2016-12-27 ENCOUNTER — Encounter: Payer: Self-pay | Admitting: Family Medicine

## 2017-01-22 ENCOUNTER — Encounter: Payer: Self-pay | Admitting: Family Medicine

## 2017-01-22 ENCOUNTER — Ambulatory Visit (INDEPENDENT_AMBULATORY_CARE_PROVIDER_SITE_OTHER): Payer: BC Managed Care – PPO | Admitting: Family Medicine

## 2017-01-22 VITALS — BP 130/82 | Temp 98.1°F | Ht 62.0 in | Wt 140.0 lb

## 2017-01-22 DIAGNOSIS — R300 Dysuria: Secondary | ICD-10-CM

## 2017-01-22 DIAGNOSIS — N390 Urinary tract infection, site not specified: Secondary | ICD-10-CM | POA: Diagnosis not present

## 2017-01-22 MED ORDER — NITROFURANTOIN MONOHYD MACRO 100 MG PO CAPS: 100.0000 mg | ORAL_CAPSULE | Freq: Two times a day (BID) | ORAL | 0 refills | Status: DC

## 2017-01-22 MED ORDER — FLUCONAZOLE 150 MG PO TABS: 150.0000 mg | ORAL_TABLET | Freq: Every day | ORAL | 0 refills | Status: DC

## 2017-01-22 NOTE — Progress Notes (Signed)
   Subjective:    Patient ID: Cheryl Gordon, female    DOB: 12-28-1955, 61 y.o.   MRN: [id number]  HPI Patient arrives with c/o dysuria for 3 days.  Hx of uti in the past  On sat took some an then yest wa feeling incr freq   No fever or chills  No nause no vo  Some diarrhea     Review of Systems No headache, no major weight loss or weight gain, no chest pain no back pain abdominal pain no change in bowel habits complete ROS otherwise negative     Objective:   Physical Exam Alert vitals stable, NAD. Blood pressure good on repeat. HEENT normal. Lungs clear. Heart regular rate and rhythm. No CVA tenderness  Urinalysis numerous white blood cells per high-powered field       Assessment & Plan:  Impression urinary tract infection. Likely confined bladder. Multiple questions answered. Antibiotics prescribed symptom care discussed

## 2017-01-25 LAB — URINE CULTURE

## 2017-01-30 MED ORDER — CIPROFLOXACIN HCL 250 MG PO TABS: 250.0000 mg | ORAL_TABLET | Freq: Two times a day (BID) | ORAL | 0 refills | Status: DC

## 2017-01-30 NOTE — Addendum Note (Signed)
Addended by: Dairl Ponder on: 01/30/2017 02:42 PM   Modules accepted: Orders

## 2017-02-09 ENCOUNTER — Other Ambulatory Visit: Payer: Self-pay | Admitting: Family Medicine

## 2017-02-19 ENCOUNTER — Other Ambulatory Visit: Payer: Self-pay | Admitting: Family Medicine

## 2017-02-19 NOTE — Telephone Encounter (Signed)
Last seen 01/18/17

## 2017-04-20 ENCOUNTER — Other Ambulatory Visit: Payer: Self-pay | Admitting: Family Medicine

## 2017-04-24 ENCOUNTER — Other Ambulatory Visit: Payer: Self-pay | Admitting: Family Medicine

## 2017-04-25 ENCOUNTER — Encounter: Payer: Self-pay | Admitting: Family Medicine

## 2017-04-25 ENCOUNTER — Ambulatory Visit: Payer: BC Managed Care – PPO | Admitting: Family Medicine

## 2017-04-25 VITALS — BP 124/80 | Temp 98.9°F | Ht 62.0 in | Wt 139.0 lb

## 2017-04-25 DIAGNOSIS — R300 Dysuria: Secondary | ICD-10-CM

## 2017-04-25 DIAGNOSIS — M7061 Trochanteric bursitis, right hip: Secondary | ICD-10-CM

## 2017-04-25 DIAGNOSIS — M7062 Trochanteric bursitis, left hip: Secondary | ICD-10-CM

## 2017-04-25 DIAGNOSIS — N390 Urinary tract infection, site not specified: Secondary | ICD-10-CM

## 2017-04-25 LAB — POCT URINALYSIS DIPSTICK
Spec Grav, UA: <=1.005 — AB (ref 1.010–1.025)
pH, UA: 6 (ref 5.0–8.0)

## 2017-04-25 MED ORDER — PREDNISONE 20 MG PO TABS: ORAL_TABLET | ORAL | 0 refills | Status: DC

## 2017-04-25 MED ORDER — NITROFURANTOIN MONOHYD MACRO 100 MG PO CAPS: 100.0000 mg | ORAL_CAPSULE | Freq: Two times a day (BID) | ORAL | 0 refills | Status: DC

## 2017-04-25 NOTE — Progress Notes (Signed)
   Subjective:    Patient ID: Cheryl Gordon, female    DOB: [birth year], 61 y.o.   MRN: [id number]  Urinary Tract Infection   This is a new problem. Episode onset: 4 days. Treatments tried: azo.   Hip pain for 2 days. Tried tylenol because no sudden injury.  Had trochanteric bursitis in the past which felt like this.  Required steroids in order to fix  Also developed UTI symptoms last week.  Took someone else's Cipro.  Finish night before last.  Still feeling a little queasy mild dysuria and increased frequency   Review of Systems No headache, no major weight loss or weight gain, no chest pain no back pain abdominal pain no change in bowel habits complete ROS otherwise negative     Objective:   Physical Exam  Alert vitals stable, NAD. Blood pressure good on repeat. HEENT normal. Lungs clear. Heart regular rate and rhythm. No CVA tenderness.  Positive lateral hip tenderness      Assessment & Plan:  Impression probable urinary tract infection partially treated stopped antibiotics 36 hours ago discussed antibiotics prescribed once again urine culture  2.  Right hip trochanteric bursitis states prednisone helped last time will prescribe

## 2017-04-27 LAB — SPECIMEN STATUS REPORT

## 2017-04-27 LAB — URINE CULTURE: Organism ID, Bacteria: NO GROWTH

## 2017-06-11 ENCOUNTER — Other Ambulatory Visit: Payer: Self-pay | Admitting: Family Medicine

## 2017-06-20 ENCOUNTER — Ambulatory Visit: Payer: BC Managed Care – PPO | Admitting: Family Medicine

## 2017-06-21 ENCOUNTER — Ambulatory Visit: Payer: BC Managed Care – PPO | Admitting: Family Medicine

## 2017-06-26 ENCOUNTER — Telehealth: Payer: Self-pay | Admitting: Family Medicine

## 2017-06-26 NOTE — Telephone Encounter (Signed)
Last labs drawn 12/18/2016 Tsh,Bmet,Hepatic,Lipid.Please advise.

## 2017-06-26 NOTE — Telephone Encounter (Signed)
Patient has an appointment with Dr. Richardson Landry on 06/29/17.  She wants to know if she is due for labs?

## 2017-06-26 NOTE — Telephone Encounter (Signed)
Patient notified and verbalized understanding. 

## 2017-06-26 NOTE — Telephone Encounter (Signed)
dont think we need to this time

## 2017-06-29 ENCOUNTER — Ambulatory Visit: Payer: BC Managed Care – PPO | Admitting: Family Medicine

## 2017-06-29 ENCOUNTER — Encounter: Payer: Self-pay | Admitting: Family Medicine

## 2017-06-29 VITALS — BP 122/82 | Ht 62.0 in | Wt 136.4 lb

## 2017-06-29 DIAGNOSIS — I10 Essential (primary) hypertension: Secondary | ICD-10-CM

## 2017-06-29 DIAGNOSIS — R358 Other polyuria: Secondary | ICD-10-CM

## 2017-06-29 DIAGNOSIS — E039 Hypothyroidism, unspecified: Secondary | ICD-10-CM | POA: Diagnosis not present

## 2017-06-29 DIAGNOSIS — R3589 Other polyuria: Secondary | ICD-10-CM

## 2017-06-29 LAB — POCT URINALYSIS DIPSTICK
Spec Grav, UA: 1.02 (ref 1.010–1.025)
pH, UA: 6 (ref 5.0–8.0)

## 2017-06-29 MED ORDER — INDAPAMIDE 2.5 MG PO TABS: ORAL_TABLET | ORAL | 5 refills | Status: DC

## 2017-06-29 MED ORDER — LEVOTHYROXINE SODIUM 137 MCG PO TABS: ORAL_TABLET | ORAL | 5 refills | Status: DC

## 2017-06-29 NOTE — Progress Notes (Signed)
   Subjective:    Patient ID: Cheryl Gordon, female    DOB: [birth year], 62 y.o.   MRN: [id number]  Hypertension  This is a chronic problem. The current episode started more than 1 year ago. Risk factors for coronary artery disease include post-menopausal state. Treatments tried: lozol. There are no compliance problems.    Blood pressure medicine and blood pressure levels reviewed today with patient. Compliant with blood pressure medicine. States does not miss a dose. No obvious side effects. Blood pressure generally good when checked elsewhere. Watching salt intake.  Night time urination frequent.  Patient has history of interstitial cystitis.  Has not seen urologist for a couple years.  More nighttime urination.  No obvious dysuria.  Patient experiencing substantial stress.  Certainly understandable considering tragic loss of husband within the past year.  He died from [place].  Patient still expect some stress from this.  Reports a lot of difficulty getting to sleep at night  Waking up frequetly at night   Results for orders placed or performed in visit on 06/29/17  POCT urinalysis dipstick  Result Value Ref Range   Color, UA     Clarity, UA     Glucose, UA     Bilirubin, UA     Ketones, UA     Spec Grav, UA 1.020 1.010 - 1.025   Blood, UA     pH, UA 6.0 5.0 - 8.0   Protein, UA     Urobilinogen, UA  0.2 or 1.0 E.U./dL   Nitrite, UA     Leukocytes, UA  Negative   Appearance     Odor        Trouble sleeping         Review of Systems No headache, no major weight loss or weight gain, no chest pain no back pain abdominal pain no change in bowel habits complete ROS otherwise negative     Objective:   Physical Exam  Alert and oriented, vitals reviewed and stable, NAD ENT-TM's and ext canals WNL bilat via otoscopic exam Soft palate, tonsils and post pharynx WNL via oropharyngeal exam Neck-symmetric, no masses; thyroid nonpalpable and nontender Pulmonary-no tachypnea or  accessory muscle use; Clear without wheezes via auscultation Card--no abnrml murmurs, rhythm reg and rate WNL Carotid pulses symmetric, without bruits       Assessment & Plan:  Impression 1 hypertension good control discussed to maintain same meds  2.  Hypothyroidism compliant with meds thyroid-stimulating hormone good just 6 months ago  3.  Insomnia substantial discussed will recommend initiating melatonin rationale discussed  4.  Urinary symptoms.  May or may not be related to true infection.  Will do urine culture.  If culture negative and persist would recommend getting back to urologist  Recheck in 6 months medications refilled diet exercise discussed and encouraged

## 2017-06-29 NOTE — Patient Instructions (Signed)
meatonin 5 mg  Each eve

## 2017-07-03 LAB — URINE CULTURE

## 2017-07-06 ENCOUNTER — Other Ambulatory Visit: Payer: Self-pay

## 2017-07-06 MED ORDER — CIPROFLOXACIN HCL 250 MG PO TABS: 250.0000 mg | ORAL_TABLET | Freq: Two times a day (BID) | ORAL | 0 refills | Status: AC

## 2017-07-06 MED ORDER — FLUCONAZOLE 150 MG PO TABS: ORAL_TABLET | ORAL | 0 refills | Status: DC

## 2017-07-06 NOTE — Addendum Note (Signed)
Addended by: Karle Barr on: 07/06/2017 02:36 PM   Modules accepted: Orders

## 2017-08-14 ENCOUNTER — Other Ambulatory Visit: Payer: Self-pay | Admitting: Family Medicine

## 2017-08-24 ENCOUNTER — Encounter: Payer: Self-pay | Admitting: Family Medicine

## 2017-08-24 ENCOUNTER — Other Ambulatory Visit: Payer: Self-pay | Admitting: Family Medicine

## 2017-08-24 ENCOUNTER — Ambulatory Visit: Payer: BC Managed Care – PPO | Admitting: Family Medicine

## 2017-08-24 VITALS — BP 120/82 | Temp 98.0°F | Ht 62.0 in | Wt 134.0 lb

## 2017-08-24 DIAGNOSIS — M25512 Pain in left shoulder: Secondary | ICD-10-CM | POA: Diagnosis not present

## 2017-08-24 MED ORDER — PREDNISONE 20 MG PO TABS: ORAL_TABLET | ORAL | 0 refills | Status: DC

## 2017-08-24 NOTE — Progress Notes (Signed)
   Subjective:    Patient ID: Cheryl Gordon, female    DOB: May 21, 1956, 62 y.o.   MRN: [id number]  HPI Patient is here today with complaints of left arm pain and right hip pain ongoing for a month now.She has been taking Tylenol.  Right hip substantila  Pain   If ealking with a buggy does not hurt  But when walking by herself it hurts  Deep ache in the shoulder    Left thumb painful at times  Pain in upper arm and sensitivity    Right handed      Tried tylenol prn ,  Does not take ibuporife     Shoulder thru the yrs has been ok  (Hx of pinched nerve in the rigde side fomthe cv spine ) Review of Systems No headache, no major weight loss or weight gain, no chest pain no back pain abdominal pain no change in bowel habits complete ROS otherwise negative     Objective:   Physical Exam Alert vitals stable, NAD. Blood pressure good on repeat. HEENT normal. Lungs clear. Heart regular rate and rhythm. Left shoulder good range of motion.  No evidence of impingement.  Neck good range of motion left hand grip good some pain anterior joint line shoulder pain post       Assessment & Plan:  Impression subacute left shoulder pain.  Unable to take regular anti-inflammatories.  Cannot tolerate injection.  Physical therapy Codman's exercises discussed.  Prednisone taper discussed proper lifting techniques discussed

## 2017-09-03 ENCOUNTER — Telehealth: Payer: Self-pay | Admitting: Family Medicine

## 2017-09-03 ENCOUNTER — Other Ambulatory Visit: Payer: Self-pay | Admitting: Family Medicine

## 2017-09-03 NOTE — Telephone Encounter (Signed)
Patient said she has developed a yeast infection in her mouth.  She said this happens very easily to her.  She believes it came from the prednisone she was recently prescribed.  She said she had the pharmacy send over Rx request for this, but she wanted to just let the doctor know why she was requesting it.   Cheryl Gordon

## 2017-11-12 ENCOUNTER — Other Ambulatory Visit: Payer: Self-pay | Admitting: Family Medicine

## 2017-12-27 ENCOUNTER — Encounter: Payer: Self-pay | Admitting: Family Medicine

## 2017-12-27 ENCOUNTER — Ambulatory Visit: Payer: BC Managed Care – PPO | Admitting: Family Medicine

## 2017-12-27 VITALS — BP 142/86 | Ht 62.0 in | Wt 134.4 lb

## 2017-12-27 DIAGNOSIS — E039 Hypothyroidism, unspecified: Secondary | ICD-10-CM

## 2017-12-27 DIAGNOSIS — I10 Essential (primary) hypertension: Secondary | ICD-10-CM | POA: Diagnosis not present

## 2017-12-27 DIAGNOSIS — F4321 Adjustment disorder with depressed mood: Secondary | ICD-10-CM | POA: Diagnosis not present

## 2017-12-27 DIAGNOSIS — E785 Hyperlipidemia, unspecified: Secondary | ICD-10-CM

## 2017-12-27 DIAGNOSIS — Z79899 Other long term (current) drug therapy: Secondary | ICD-10-CM

## 2017-12-27 DIAGNOSIS — R5383 Other fatigue: Secondary | ICD-10-CM | POA: Diagnosis not present

## 2017-12-27 MED ORDER — INDAPAMIDE 2.5 MG PO TABS: ORAL_TABLET | ORAL | 5 refills | Status: DC

## 2017-12-27 MED ORDER — POTASSIUM CHLORIDE ER 10 MEQ PO TBCR: 10.0000 meq | EXTENDED_RELEASE_TABLET | Freq: Two times a day (BID) | ORAL | 5 refills | Status: DC

## 2017-12-27 MED ORDER — LEVOTHYROXINE SODIUM 137 MCG PO TABS: ORAL_TABLET | ORAL | 5 refills | Status: DC

## 2017-12-27 NOTE — Progress Notes (Signed)
   Subjective:    Patient ID: Cheryl Gordon, female    DOB: September 04, 1955, 62 y.o.   MRN: [id number]  Hypertension  This is a chronic problem. There are no compliance problems.    Pt is still having trouble sleeping due to getting up using the bathroom at night.  Urologist place patient on Myrbetriq 25mg   Walking some  Back acting up some  Sees the chiropractor once perwk  Blood pressure medicine and blood pressure levels reviewed today with patient. Compliant with blood pressure medicine. States does not miss a dose. No obvious side effects. Blood pressure generally good when checked elsewhere. Watching salt intake. Thyroid overall doing ok with compliance.  No symptoms of excessive fatigue or tiredness.  Claims compliance with medication.  Generally does not dose.  Ongoing challenges with grief.  Slowly improving this regard.  Lost husband tragically to [place] last year     Urologist dr Precious Reel  Now on mybetriq still having issues ith trouble urinating   Using the 25 mg qhs , handling well  Needing to go          Review of Systems No headache, no major weight loss or weight gain, no chest pain no back pain abdominal pain no change in bowel habits complete ROS otherwise negative     Objective:   Physical Exam  Alert and oriented, vitals reviewed and stable, NAD ENT-TM's and ext canals WNL bilat via otoscopic exam Soft palate, tonsils and post pharynx WNL via oropharyngeal exam Neck-symmetric, no masses; thyroid nonpalpable and nontender Pulmonary-no tachypnea or accessory muscle use; Clear without wheezes via auscultation Card--no abnrml murmurs, rhythm reg and rate WNL Carotid pulses symmetric, without bruits       Assessment & Plan:  Impression hypertension.  Discussed.  Patient maintain same meds rationale discussed  2.  Hyperlipidemia.  Prior blood work reviewed.  3.  Hypothyroidism.  Status uncertain.  Will check blood results.  4.  Grief.   Understandable.  Tragic loss of husband discussed.  Patient has supportive family strong faith  Will up in 6 months diet exercise discussed medication refill appropriate blood work ordered further recommendations based

## 2018-01-01 LAB — BASIC METABOLIC PANEL
BUN/Creatinine Ratio: 17 (ref 12–28)
BUN: 18 mg/dL (ref 8–27)
CO2: 28 mmol/L (ref 20–29)
Calcium: 9.8 mg/dL (ref 8.7–10.3)
Chloride: 100 mmol/L (ref 96–106)
Creatinine, Ser: 1.04 mg/dL — ABNORMAL HIGH (ref 0.57–1.00)
GFR calc Af Amer: 66 mL/min/{1.73_m2} (ref >59)
GFR calc non Af Amer: 57 mL/min/{1.73_m2} — ABNORMAL LOW (ref >59)
Glucose: 93 mg/dL (ref 65–99)
Potassium: 4 mmol/L (ref 3.5–5.2)
Sodium: 143 mmol/L (ref 134–144)

## 2018-01-01 LAB — CBC WITH DIFFERENTIAL/PLATELET
Basophils Absolute: 0 10*3/uL (ref 0.0–0.2)
Basos: 1 %
EOS (ABSOLUTE): 0.3 10*3/uL (ref 0.0–0.4)
Eos: 6 %
Hematocrit: 39 % (ref 34.0–46.6)
Hemoglobin: 13.2 g/dL (ref 11.1–15.9)
Immature Grans (Abs): 0 10*3/uL (ref 0.0–0.1)
Immature Granulocytes: 0 %
Lymphocytes Absolute: 1.2 10*3/uL (ref 0.7–3.1)
Lymphs: 28 %
MCH: 30.3 pg (ref 26.6–33.0)
MCHC: 33.8 g/dL (ref 31.5–35.7)
MCV: 89 fL (ref 79–97)
Monocytes Absolute: 0.4 10*3/uL (ref 0.1–0.9)
Monocytes: 9 %
Neutrophils Absolute: 2.4 10*3/uL (ref 1.4–7.0)
Neutrophils: 56 %
Platelets: 233 10*3/uL (ref 150–450)
RBC: 4.36 x10E6/uL (ref 3.77–5.28)
RDW: 13.6 % (ref 12.3–15.4)
WBC: 4.4 10*3/uL (ref 3.4–10.8)

## 2018-01-01 LAB — LIPID PANEL
Chol/HDL Ratio: 3.9 ratio (ref 0.0–4.4)
Cholesterol, Total: 183 mg/dL (ref 100–199)
HDL: 47 mg/dL (ref >39)
LDL Calculated: 121 mg/dL — ABNORMAL HIGH (ref 0–99)
Triglycerides: 77 mg/dL (ref 0–149)
VLDL Cholesterol Cal: 15 mg/dL (ref 5–40)

## 2018-01-01 LAB — HEPATIC FUNCTION PANEL
ALT: 11 IU/L (ref 0–32)
AST: 15 IU/L (ref 0–40)
Albumin: 4.6 g/dL (ref 3.6–4.8)
Alkaline Phosphatase: 81 IU/L (ref 39–117)
Bilirubin Total: 0.4 mg/dL (ref 0.0–1.2)
Bilirubin, Direct: 0.1 mg/dL (ref 0.00–0.40)
Total Protein: 6.8 g/dL (ref 6.0–8.5)

## 2018-01-01 LAB — TSH: TSH: 0.412 u[IU]/mL — ABNORMAL LOW (ref 0.450–4.500)

## 2018-01-09 ENCOUNTER — Other Ambulatory Visit: Payer: Self-pay | Admitting: *Deleted

## 2018-01-09 ENCOUNTER — Encounter: Payer: Self-pay | Admitting: *Deleted

## 2018-03-08 ENCOUNTER — Other Ambulatory Visit: Payer: Self-pay | Admitting: Family Medicine

## 2018-06-12 ENCOUNTER — Encounter: Payer: Self-pay | Admitting: Family Medicine

## 2018-06-12 ENCOUNTER — Ambulatory Visit: Payer: BC Managed Care – PPO | Admitting: Family Medicine

## 2018-06-12 VITALS — BP 138/84 | Ht 62.0 in | Wt 139.4 lb

## 2018-06-12 DIAGNOSIS — R5383 Other fatigue: Secondary | ICD-10-CM | POA: Diagnosis not present

## 2018-06-12 DIAGNOSIS — I10 Essential (primary) hypertension: Secondary | ICD-10-CM

## 2018-06-12 DIAGNOSIS — F4321 Adjustment disorder with depressed mood: Secondary | ICD-10-CM

## 2018-06-12 DIAGNOSIS — E039 Hypothyroidism, unspecified: Secondary | ICD-10-CM

## 2018-06-12 MED ORDER — LEVOTHYROXINE SODIUM 137 MCG PO TABS: ORAL_TABLET | ORAL | 5 refills | Status: DC

## 2018-06-12 MED ORDER — INDAPAMIDE 2.5 MG PO TABS: ORAL_TABLET | ORAL | 5 refills | Status: DC

## 2018-06-12 MED ORDER — POTASSIUM CHLORIDE ER 10 MEQ PO TBCR: 10.0000 meq | EXTENDED_RELEASE_TABLET | Freq: Two times a day (BID) | ORAL | 5 refills | Status: DC

## 2018-06-12 NOTE — Progress Notes (Signed)
   Subjective:    Patient ID: Cheryl Gordon, female    DOB: 1956/05/06, 63 y.o.   MRN: [id number]  Hyperlipidemia  This is a chronic problem.  Hypertension  This is a chronic problem. Compliance problems: pt states she had not been checking BP often but she feels fine.    Pt here for 6 month follow up. Pt also states she has had a head cold for about for about 2 weeks. Woke up hoarse and nose stopped up. Took tylenol, benadryl over the weekend. Has gotten better.    Blood pressure medicine and blood pressure levels reviewed today with patient. Compliant with blood pressure medicine. States does not miss a dose. No obvious side effects. Blood pressure generally good when checked elsewhere. Watching salt intake.   Patient compliant with thyroid medicine.  Other symptoms.  No excessive fatigue.  Generally has reasonable energy.  Still dealing with grief from difficult loss of husband from [place] last year.  Discussed.  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  Mild frontal tenderness.  Bilat via otoscopic exam Soft palate, tonsils and post pharynx WNL via oropharyngeal exam Neck-symmetric, no masses; thyroid nonpalpable and nontender Pulmonary-no tachypnea or accessory muscle use; Clear without wheezes via auscultation Card--no abnrml murmurs, rhythm reg and rate WNL Carotid pulses symmetric, without bruits        Assessment & Plan:  Impression 1 subacute upper respiratory viral infection.  Discussed symptom care discussed at this time improving with no need for intervention  2.  Hypertension.  Good control.  Blood pressure improved on repeat maintain same  meds  3.  Hypothyroidism ongoing.  Discussed to maintain same meds  4.  Status post difficult loss of spouse under challenging circumstances.  Husband having ALS.  Discussed  Further recommendations based on blood work.  Antibiotics prescribed.  Symptom care discussed warning signs discussed

## 2018-06-13 LAB — TSH: TSH: 0.306 u[IU]/mL — ABNORMAL LOW (ref 0.450–4.500)

## 2018-06-18 ENCOUNTER — Encounter: Payer: Self-pay | Admitting: *Deleted

## 2018-06-19 ENCOUNTER — Ambulatory Visit (INDEPENDENT_AMBULATORY_CARE_PROVIDER_SITE_OTHER): Payer: BC Managed Care – PPO

## 2018-06-19 DIAGNOSIS — Z23 Encounter for immunization: Secondary | ICD-10-CM | POA: Diagnosis not present

## 2018-06-26 ENCOUNTER — Telehealth: Payer: Self-pay | Admitting: Family Medicine

## 2018-06-26 NOTE — Telephone Encounter (Signed)
Two were decent, two wer high, keep cking twice per week and call us in one mo with numbers

## 2018-06-26 NOTE — Telephone Encounter (Signed)
Patient states at her visit on 06/12/2018 was told to monitor her blood pressure. Here are her readings: 1/20-126/88 134/104 cant remember date on this one, this morning walked her dog then took her pressure it was 153/101 and her pulse 97 but she told it at 1 pm it was 135/85 pulse 85.

## 2018-06-26 NOTE — Telephone Encounter (Signed)
Patient advised per Dr Richardson Landry: Two numbers were decent, two were high, keep checking blood pressure twice per week and call us in one mo with numbers. Patient verbalized understanding.

## 2018-08-23 ENCOUNTER — Telehealth: Payer: Self-pay | Admitting: Family Medicine

## 2018-08-23 NOTE — Telephone Encounter (Signed)
I certainly understand what the patient is saying but I would recommend that this message be forwarded to Dr. Richardson Landry Please inform the patient that we will have Dr. Richardson Landry look at this message and reply on Monday He is much more familiar with the reason he made switches in her medicines

## 2018-08-23 NOTE — Telephone Encounter (Signed)
Patient is aware we will have Dr. Richardson Landry look into this.

## 2018-08-23 NOTE — Telephone Encounter (Signed)
Patient feels like her blood pressure was under better control with ramipril than the lozol she is currently on -please advise

## 2018-08-23 NOTE — Telephone Encounter (Signed)
Patient states when she was on ramipril (ALTACE) 10 MG capsule she never had issues with her blood pressure, patient states recent reading has registered 127/88 & 144/100, patient states pressure does not differentiate between morning or night from high or low. No light headedness or dizziness with pressure readings. Would like to know what Dr.Steve's recommendation would be. Advise.   Pharmacy:  [facility], [place]

## 2018-08-27 ENCOUNTER — Other Ambulatory Visit: Payer: Self-pay | Admitting: Family Medicine

## 2018-08-27 ENCOUNTER — Other Ambulatory Visit: Payer: Self-pay

## 2018-08-27 MED ORDER — RAMIPRIL 10 MG PO CAPS: ORAL_CAPSULE | ORAL | 5 refills | Status: DC

## 2018-08-27 NOTE — Telephone Encounter (Signed)
Ok stop lozol start ramipril old dose, look it up, six mo worth

## 2018-08-27 NOTE — Telephone Encounter (Signed)
Resent in Ramipril old dose from history and pt is aware. Pt verbalized understanding.

## 2018-11-18 ENCOUNTER — Other Ambulatory Visit: Payer: Self-pay

## 2018-11-18 ENCOUNTER — Encounter: Payer: Self-pay | Admitting: Family Medicine

## 2018-11-18 ENCOUNTER — Ambulatory Visit (INDEPENDENT_AMBULATORY_CARE_PROVIDER_SITE_OTHER): Payer: BC Managed Care – PPO | Admitting: Family Medicine

## 2018-11-18 DIAGNOSIS — I10 Essential (primary) hypertension: Secondary | ICD-10-CM

## 2018-11-18 MED ORDER — RAMIPRIL 10 MG PO CAPS: ORAL_CAPSULE | ORAL | 5 refills | Status: DC

## 2018-11-18 NOTE — Addendum Note (Signed)
Addended by: Dairl Ponder on: 11/18/2018 04:59 PM   Modules accepted: Orders

## 2018-11-18 NOTE — Progress Notes (Signed)
   Subjective:    Patient ID: Cheryl Gordon, female    DOB: 1956-01-09, 63 y.o.   MRN: [id number] Audio plus video HPI  Patient states she feels like her blood pressure is up for last 2 weeks- patient states she feels very anxious and started checking blood pressure and it will be high at times. Patient states her med was changed to ramipril in March and she just started noticing anxious and nervous feeling a few weeks ago.  Virtual Visit via Video Note  I connected with Leamon Arnt Thau on 11/18/18 at  3:50 PM EDT by a video enabled telemedicine application and verified that I am speaking with the correct person using two identifiers.  Location: Patient: home Provider: office   I discussed the limitations of evaluation and management by telemedicine and the availability of in person appointments. The patient expressed understanding and agreed to proceed.  History of Present Illness:    Observations/Objective:   Assessment and Plan:   Follow Up Instructions:    I discussed the assessment and treatment plan with the patient. The patient was provided an opportunity to ask questions and all were answered. The patient agreed with the plan and demonstrated an understanding of the instructions.   The patient was advised to call back or seek an in-person evaluation if the symptoms worsen or if the condition fails to improve as anticipated.  I provided 20 minutes of non-face-to-face time during this encounter.  Patient admits to quite a bit of stress.  Anxiety.  Or the sensation at times.  On Wellbutrin via her's pain specialist.  Just 150 mg daily  Also checking her blood pressure.  Consistently elevated.  Realizes numbers may be better  Exercising quite a bit.  Review of Systems .rs No headache, no major weight loss or weight gain, no chest pain no back pain abdominal pain no change in bowel habits complete ROS otherwise negative     Objective:   Physical Exam  Virtual      Assessment & Plan:  Impression hypertension.  Good control discussed double ramipril to 1 twice daily rationale discussed.  Exercise diet discussed  2.  Chronic anxiety.  Clinically worsened if continues to be difficulty would recommend following specialist increase Wellbutrin

## 2018-12-04 ENCOUNTER — Other Ambulatory Visit: Payer: Self-pay | Admitting: Family Medicine

## 2018-12-10 ENCOUNTER — Other Ambulatory Visit: Payer: Self-pay

## 2018-12-11 ENCOUNTER — Ambulatory Visit (INDEPENDENT_AMBULATORY_CARE_PROVIDER_SITE_OTHER): Payer: BC Managed Care – PPO | Admitting: Family Medicine

## 2018-12-11 ENCOUNTER — Other Ambulatory Visit: Payer: Self-pay

## 2018-12-11 DIAGNOSIS — I10 Essential (primary) hypertension: Secondary | ICD-10-CM

## 2018-12-11 MED ORDER — LEVOTHYROXINE SODIUM 137 MCG PO TABS: ORAL_TABLET | ORAL | 5 refills | Status: DC

## 2018-12-11 MED ORDER — RAMIPRIL 10 MG PO CAPS: ORAL_CAPSULE | ORAL | 5 refills | Status: DC

## 2018-12-11 MED ORDER — POTASSIUM CHLORIDE ER 10 MEQ PO TBCR: 10.0000 meq | EXTENDED_RELEASE_TABLET | Freq: Two times a day (BID) | ORAL | 5 refills | Status: DC

## 2018-12-11 NOTE — Progress Notes (Signed)
   Subjective:  Audio plus video  Patient ID: Cheryl Gordon, female    DOB: 1955/06/26, 63 y.o.   MRN: [id number]  Hypertension This is a chronic problem. The current episode started more than 1 year ago. Risk factors for coronary artery disease include dyslipidemia and post-menopausal state. Treatments tried: altace BID.    Virtual Visit via Video Note  I connected with Leamon Arnt Stille on 12/11/18 at  3:00 PM EDT by a video enabled telemedicine application and verified that I am speaking with the correct person using two identifiers.  Location: Patient: home Provider: office   I discussed the limitations of evaluation and management by telemedicine and the availability of in person appointments. The patient expressed understanding and agreed to proceed.  History of Present Illness:    Observations/Objective:   Assessment and Plan:   Follow Up Instructions:    I discussed the assessment and treatment plan with the patient. The patient was provided an opportunity to ask questions and all were answered. The patient agreed with the plan and demonstrated an understanding of the instructions.   The patient was advised to call back or seek an in-person evaluation if the symptoms worsen or if the condition fails to improve as anticipated.  I provided 44minutes of non-face-to-face time during this encounter.   Blood pressure medicine and blood pressure levels reviewed today with patient. Compliant with blood pressure medicine. States does not miss a dose. No obvious side effects. Blood pressure generally good when checked elsewhere. Watching salt intake.     Review of Systems No headache, no major weight loss or weight gain, no chest pain no back pain abdominal pain no change in bowel habits complete ROS otherwise negative     Objective:   Physical Exam  Virtual      Assessment & Plan:  Impression hypertension.  Good control.  Discussed to maintain meds diet exercise  discussed medications refilled follow-up in 6 months

## 2018-12-14 ENCOUNTER — Encounter: Payer: Self-pay | Admitting: Family Medicine

## 2019-02-13 ENCOUNTER — Other Ambulatory Visit: Payer: Self-pay | Admitting: Family Medicine

## 2019-04-16 ENCOUNTER — Other Ambulatory Visit: Payer: Self-pay | Admitting: Family Medicine

## 2019-04-18 DIAGNOSIS — Z90711 Acquired absence of uterus with remaining cervical stump: Secondary | ICD-10-CM | POA: Insufficient documentation

## 2019-04-18 LAB — HM DEXA SCAN: HM Dexa Scan: NORMAL

## 2019-06-13 ENCOUNTER — Other Ambulatory Visit: Payer: Self-pay | Admitting: Family Medicine

## 2019-06-27 ENCOUNTER — Other Ambulatory Visit: Payer: Self-pay | Admitting: Family Medicine

## 2019-06-29 NOTE — Telephone Encounter (Signed)
Sched six mo virt ck up within 30 d, may ref times one

## 2019-06-30 NOTE — Telephone Encounter (Signed)
Patient made and appt for 07-09-19 but is out of her BP meds now. Patient request refill.

## 2019-06-30 NOTE — Telephone Encounter (Signed)
Please schedule visit and then route back  

## 2019-06-30 NOTE — Telephone Encounter (Signed)
lvm to schedule virtual visit

## 2019-07-09 ENCOUNTER — Other Ambulatory Visit: Payer: Self-pay

## 2019-07-09 ENCOUNTER — Ambulatory Visit (INDEPENDENT_AMBULATORY_CARE_PROVIDER_SITE_OTHER): Payer: BC Managed Care – PPO | Admitting: Family Medicine

## 2019-07-09 DIAGNOSIS — E785 Hyperlipidemia, unspecified: Secondary | ICD-10-CM

## 2019-07-09 DIAGNOSIS — R5383 Other fatigue: Secondary | ICD-10-CM | POA: Diagnosis not present

## 2019-07-09 DIAGNOSIS — E039 Hypothyroidism, unspecified: Secondary | ICD-10-CM | POA: Diagnosis not present

## 2019-07-09 DIAGNOSIS — I10 Essential (primary) hypertension: Secondary | ICD-10-CM | POA: Diagnosis not present

## 2019-07-09 MED ORDER — RAMIPRIL 10 MG PO CAPS: ORAL_CAPSULE | ORAL | 5 refills | Status: DC

## 2019-07-09 MED ORDER — LEVOTHYROXINE SODIUM 137 MCG PO TABS: ORAL_TABLET | ORAL | 11 refills | Status: DC

## 2019-07-09 NOTE — Progress Notes (Signed)
   Subjective:  Audio  Patient ID: Cheryl Gordon, female    DOB: 10/13/1955, 64 y.o.   MRN: [id number]  Hypertension This is a chronic problem. The current episode started more than 1 year ago. Risk factors for coronary artery disease include post-menopausal state. Treatments tried: Altace. There are no compliance problems.    Blood pressure running good with at home machine per patient and she is doing well.  Blood pressure medicine and blood pressure levels reviewed today with patient. Compliant with blood pressure medicine. States does not miss a dose. No obvious side effects. Blood pressure generally good when checked elsewhere. Watching salt intake.      Review of Systems Virtual Visit via Video Note  I connected with Leamon Arnt Kawecki on 07/09/19 at 11:00 AM EST by a video enabled telemedicine application and verified that I am speaking with the correct person using two identifiers.  Location: Patient: home Provider: office   I discussed the limitations of evaluation and management by telemedicine and the availability of in person appointments. The patient expressed understanding and agreed to proceed.  History of Present Illness:    Observations/Objective:   Assessment and Plan:   Follow Up Instructions:    I discussed the assessment and treatment plan with the patient. The patient was provided an opportunity to ask questions and all were answered. The patient agreed with the plan and demonstrated an understanding of the instructions.   The patient was advised to call back or seek an in-person evaluation if the symptoms worsen or if the condition fails to improve as anticipated.  I provided 20 minutes of non-face-to-face time during this encounter.  Patient compliant with blood pressure medication.  Blood pressure generally good when checked elsewhere.  Walking a fair amount.  Overall doing well this winter.  Compliant with her thyroid medication.  No symptoms of  high or low thyroid.      Objective:   Physical Exam  Virtual     Assessment & Plan:  Impression hypertension.  Good control discussed maintain same meds refills given  2.  Hypothyroidism.  Exact control uncertain.  Discussed appropriate blood work with TSH and other screening blood test.  Diet exercise discussed follow-up in 6 months

## 2019-07-10 ENCOUNTER — Encounter: Payer: Self-pay | Admitting: Family Medicine

## 2019-07-24 LAB — HEPATIC FUNCTION PANEL
ALT: 10 IU/L (ref 0–32)
AST: 15 IU/L (ref 0–40)
Albumin: 4.3 g/dL (ref 3.8–4.8)
Alkaline Phosphatase: 79 IU/L (ref 39–117)
Bilirubin Total: <0.2 mg/dL (ref 0.0–1.2)
Bilirubin, Direct: 0.08 mg/dL (ref 0.00–0.40)
Total Protein: 6.7 g/dL (ref 6.0–8.5)

## 2019-07-24 LAB — LIPID PANEL
Chol/HDL Ratio: 4.7 ratio — ABNORMAL HIGH (ref 0.0–4.4)
Cholesterol, Total: 187 mg/dL (ref 100–199)
HDL: 40 mg/dL (ref >39)
LDL Chol Calc (NIH): 131 mg/dL — ABNORMAL HIGH (ref 0–99)
Triglycerides: 89 mg/dL (ref 0–149)
VLDL Cholesterol Cal: 16 mg/dL (ref 5–40)

## 2019-07-24 LAB — TSH: TSH: 0.549 u[IU]/mL (ref 0.450–4.500)

## 2019-07-24 LAB — BASIC METABOLIC PANEL
BUN/Creatinine Ratio: 16 (ref 12–28)
BUN: 17 mg/dL (ref 8–27)
CO2: 25 mmol/L (ref 20–29)
Calcium: 9.8 mg/dL (ref 8.7–10.3)
Chloride: 104 mmol/L (ref 96–106)
Creatinine, Ser: 1.04 mg/dL — ABNORMAL HIGH (ref 0.57–1.00)
GFR calc Af Amer: 66 mL/min/{1.73_m2} (ref >59)
GFR calc non Af Amer: 57 mL/min/{1.73_m2} — ABNORMAL LOW (ref >59)
Glucose: 100 mg/dL — ABNORMAL HIGH (ref 65–99)
Potassium: 4.8 mmol/L (ref 3.5–5.2)
Sodium: 141 mmol/L (ref 134–144)

## 2019-07-25 ENCOUNTER — Encounter: Payer: Self-pay | Admitting: Family Medicine

## 2019-08-05 ENCOUNTER — Other Ambulatory Visit: Payer: Self-pay | Admitting: Family Medicine

## 2019-08-06 ENCOUNTER — Encounter: Payer: Self-pay | Admitting: Family Medicine

## 2019-08-06 NOTE — Telephone Encounter (Signed)
Cheryl Kirschner, MD     Not sure what  happened, I reviewed all the b w, and composed a letter, and sent to pt two weeks ago, numbers overall quite good with a couple just barely off, stay on all the same meds, follow up in six months. Send letter if did not get

## 2019-08-06 NOTE — Telephone Encounter (Signed)
Patient states she just got my chart today and saw her blood work results that were just drawn and didn't understand them and would like the results and make sure everything is ok

## 2019-09-18 ENCOUNTER — Other Ambulatory Visit: Payer: Self-pay

## 2019-09-18 ENCOUNTER — Ambulatory Visit: Payer: Medicare PPO | Attending: Internal Medicine

## 2019-09-18 DIAGNOSIS — Z20822 Contact with and (suspected) exposure to covid-19: Secondary | ICD-10-CM

## 2019-09-18 DIAGNOSIS — U071 COVID-19: Secondary | ICD-10-CM | POA: Insufficient documentation

## 2019-09-19 LAB — NOVEL CORONAVIRUS, NAA: SARS-CoV-2, NAA: DETECTED — AB

## 2019-09-19 LAB — SARS-COV-2, NAA 2 DAY TAT

## 2019-09-20 ENCOUNTER — Telehealth: Payer: Self-pay | Admitting: Nurse Practitioner

## 2019-09-20 ENCOUNTER — Telehealth: Payer: Self-pay | Admitting: Physician Assistant

## 2019-09-20 NOTE — Telephone Encounter (Signed)
Called to discuss with Cheryl Gordon about Covid symptoms and the use of bamlanivimab, a monoclonal antibody infusion for those with mild to moderate Covid symptoms and at a high risk of hospitalization.     Pt is qualified for this infusion at the [facility] infusion center due to co-morbid conditions and/or a member of an at-risk group (Renal failure/hypertension).   Unable to reach. Message left.   Patient Active Problem List   Diagnosis Date Noted  . Renal insufficiency 06/16/2016  . Hyperglycemia 06/16/2016  . Acute renal failure (ARF) ([place]) 08/17/2015  . Nausea with vomiting 08/17/2015  . Lightheaded 08/17/2015  . Volume depletion 08/17/2015  . Acute on chronic renal failure ([place]) 08/17/2015  . Nausea and vomiting in adult   . Flu-like symptoms   . Hypokalemia   . Thrombophlebitis 06/15/2014  . Erythema nodosum 06/11/2014  . Superficial thrombophlebitis of left leg 06/11/2014  . Unspecified hypertension antepartum(642.93) 12/10/2013  . Interstitial cystitis 12/29/2012  . Urge incontinence 12/29/2012  . Chronic back pain 12/29/2012  . Hypothyroidism 09/19/2012  . Other and unspecified hyperlipidemia 09/19/2012    Alda Tawanda, AGPCNP-BC Pager: [phone] Amion: Bjorn Pippin

## 2019-09-20 NOTE — Telephone Encounter (Signed)
Called to discuss with patient about Covid symptoms and the use of bamlanivimab/etesevimab or casirivimab/imdevimab, a monoclonal antibody infusion for those with mild to moderate Covid symptoms and at a high risk of hospitalization.  Pt is qualified for this infusion at the [facility] infusion center due to Hypertension   Message left to call back  Angelena Form PA-C  [facility]

## 2019-09-24 ENCOUNTER — Other Ambulatory Visit: Payer: Self-pay | Admitting: Nurse Practitioner

## 2019-09-24 ENCOUNTER — Telehealth: Payer: Self-pay | Admitting: Nurse Practitioner

## 2019-09-24 DIAGNOSIS — U071 COVID-19: Secondary | ICD-10-CM

## 2019-09-24 DIAGNOSIS — N289 Disorder of kidney and ureter, unspecified: Secondary | ICD-10-CM

## 2019-09-24 MED ORDER — SODIUM CHLORIDE 0.9 % IV SOLN
Freq: Once | INTRAVENOUS | Status: AC
  Filled 2019-09-24: qty 700

## 2019-09-24 NOTE — Progress Notes (Signed)
  I connected by phone with Cheryl Gordon on 09/24/2019 at 3:49 PM to discuss the potential use of an new treatment for mild to moderate COVID-19 viral infection in non-hospitalized patients.  This patient is a 64 y.o. female that meets the FDA criteria for Emergency Use Authorization of bamlanivimab/etesevimab or casirivimab/imdevimab.  Has a (+) direct SARS-CoV-2 viral test result  Has mild or moderate COVID-19   Is ? 66 years of age and weighs ? 40 kg  Is NOT hospitalized due to COVID-19  Is NOT requiring oxygen therapy or requiring an increase in baseline oxygen flow rate due to COVID-19  Is within 10 days of symptom onset  Has at least one of the high risk factor(s) for progression to severe COVID-19 and/or hospitalization as defined in EUA.  Specific high risk criteria : Chronic Kidney Disease (CKD) and hypertension.    I have spoken and communicated the following to the patient or parent/caregiver:  1. FDA has authorized the emergency use of bamlanivimab/etesevimab and casirivimab\imdevimab for the treatment of mild to moderate COVID-19 in adults and pediatric patients with positive results of direct SARS-CoV-2 viral testing who are 40 years of age and older weighing at least 40 kg, and who are at high risk for progressing to severe COVID-19 and/or hospitalization.  2. The significant known and potential risks and benefits of bamlanivimab/etesevimab and casirivimab\imdevimab, and the extent to which such potential risks and benefits are unknown.  3. Information on available alternative treatments and the risks and benefits of those alternatives, including clinical trials.  4. Patients treated with bamlanivimab/etesevimab and casirivimab\imdevimab should continue to self-isolate and use infection control measures (e.g., wear mask, isolate, social distance, avoid sharing personal items, clean and disinfect "high touch" surfaces, and frequent handwashing) according to CDC  guidelines.   5. The patient or parent/caregiver has the option to accept or refuse bamlanivimab/etesevimab or casirivimab\imdevimab .  After reviewing this information with the patient, The patient agreed to proceed with receiving the bamlanimivab infusion and will be provided a copy of the Fact sheet prior to receiving the infusion.Lexine Baton Pickenpack-Cousar 09/24/2019 3:49 PM

## 2019-09-24 NOTE — Telephone Encounter (Signed)
Called to discuss with Cheryl Gordon about Covid symptoms and the use of bamlanivimab combo, a monoclonal antibody infusion for those with mild to moderate Covid symptoms and at a high risk of hospitalization.     Pt is qualified for this infusion at the [facility] infusion center due to co-morbid conditions (hypertension/renal failure).   Patient reports symptoms initially started on 09/16/19 with fever, aches, and congestion. Symptoms have continued with now worsening congestion, cough, and increased fatigue.   Patient verbalized understanding of treatment and appointment details. She is scheduled for 09/25/19 @ 1030 as requested.    Patient Active Problem List   Diagnosis Date Noted  . Renal insufficiency 06/16/2016  . Hyperglycemia 06/16/2016  . Acute renal failure (ARF) ([place]) 08/17/2015  . Nausea with vomiting 08/17/2015  . Lightheaded 08/17/2015  . Volume depletion 08/17/2015  . Acute on chronic renal failure ([place]) 08/17/2015  . Nausea and vomiting in adult   . Flu-like symptoms   . Hypokalemia   . Thrombophlebitis 06/15/2014  . Erythema nodosum 06/11/2014  . Superficial thrombophlebitis of left leg 06/11/2014  . Unspecified hypertension antepartum(642.93) 12/10/2013  . Interstitial cystitis 12/29/2012  . Urge incontinence 12/29/2012  . Chronic back pain 12/29/2012  . Hypothyroidism 09/19/2012  . Other and unspecified hyperlipidemia 09/19/2012    Alda Destany, AGPCNP-BC Pager: [phone] Amion: Bjorn Pippin

## 2019-09-25 ENCOUNTER — Ambulatory Visit (HOSPITAL_COMMUNITY)
Admission: RE | Admit: 2019-09-25 | Discharge: 2019-09-25 | Disposition: A | Discharge status: Home / Self Care | Payer: Medicare PPO | Source: Ambulatory Visit | Attending: Pulmonary Disease | Admitting: Pulmonary Disease

## 2019-09-25 DIAGNOSIS — N289 Disorder of kidney and ureter, unspecified: Secondary | ICD-10-CM | POA: Insufficient documentation

## 2019-09-25 DIAGNOSIS — U071 COVID-19: Secondary | ICD-10-CM | POA: Insufficient documentation

## 2019-09-25 DIAGNOSIS — Z23 Encounter for immunization: Secondary | ICD-10-CM | POA: Diagnosis not present

## 2019-09-25 NOTE — Discharge Instructions (Signed)
10 Things You Can Do to Manage Your COVID-19 Symptoms at Home If you have possible or confirmed COVID-19: 1. Stay home from work and school. And stay away from other public places. If you must go out, avoid using any kind of public transportation, ridesharing, or taxis. 2. Monitor your symptoms carefully. If your symptoms get worse, call your healthcare provider immediately. 3. Get rest and stay hydrated. 4. If you have a medical appointment, call the healthcare provider ahead of time and tell them that you have or may have COVID-19. 5. For medical emergencies, call 911 and notify the dispatch personnel that you have or may have COVID-19. 6. Cover your cough and sneezes with a tissue or use the inside of your elbow. 7. Wash your hands often with soap and water for at least 20 seconds or clean your hands with an alcohol-based hand sanitizer that contains at least 60% alcohol. 8. As much as possible, stay in a specific room and away from other people in your home. Also, you should use a separate bathroom, if available. If you need to be around other people in or outside of the home, wear a mask. 9. Avoid sharing personal items with other people in your household, like dishes, towels, and bedding. 10. Clean all surfaces that are touched often, like counters, tabletops, and doorknobs. Use household cleaning sprays or wipes according to the label instructions. cdc.gov/coronavirus 12/04/2018 This information is not intended to replace advice given to you by your health care provider. Make sure you discuss any questions you have with your health care provider. Document Revised: 05/08/2019 Document Reviewed: 05/08/2019 Elsevier Patient Education  2020 Elsevier Inc. Patient is asked to monitor BP at home or work, several times per month and return with written values at next office visit.  

## 2019-09-26 NOTE — Progress Notes (Signed)
  Diagnosis: COVID-19  Physician: Dr. Joya Gaskins  Procedure: [facility] Infusion [facility] Med: bamlanivimab\etesevimab infusion - Provided patient with bamlanimivab\etesevimab fact sheet for patients, parents and caregivers prior to infusion.  Complications: No immediate complications noted.  Discharge: Discharged home   Cheryl Gordon 09/26/2019

## 2019-09-30 ENCOUNTER — Other Ambulatory Visit: Payer: Self-pay | Admitting: Family Medicine

## 2019-10-31 ENCOUNTER — Other Ambulatory Visit: Payer: Self-pay | Admitting: Family Medicine

## 2019-12-02 ENCOUNTER — Telehealth: Payer: Self-pay | Admitting: Family Medicine

## 2019-12-02 MED ORDER — POTASSIUM CHLORIDE ER 10 MEQ PO TBCR: EXTENDED_RELEASE_TABLET | ORAL | 0 refills | Status: DC

## 2019-12-02 NOTE — Telephone Encounter (Signed)
Pt scheduled follow up on 7/19 and is needing refill on potassium chloride (KLOR-CON) 10 MEQ tablet   [facility].   Pt also wants to know if lab work needs to be done before appt.

## 2019-12-03 ENCOUNTER — Other Ambulatory Visit: Payer: Self-pay | Admitting: *Deleted

## 2019-12-03 DIAGNOSIS — I10 Essential (primary) hypertension: Secondary | ICD-10-CM

## 2019-12-03 DIAGNOSIS — R5383 Other fatigue: Secondary | ICD-10-CM

## 2019-12-03 DIAGNOSIS — E039 Hypothyroidism, unspecified: Secondary | ICD-10-CM

## 2019-12-03 DIAGNOSIS — Z79899 Other long term (current) drug therapy: Secondary | ICD-10-CM

## 2019-12-03 DIAGNOSIS — E785 Hyperlipidemia, unspecified: Secondary | ICD-10-CM

## 2019-12-03 NOTE — Telephone Encounter (Signed)
Lmtc. Labs ordered.  °

## 2019-12-05 NOTE — Telephone Encounter (Signed)
Patient notified

## 2019-12-20 LAB — BASIC METABOLIC PANEL
BUN/Creatinine Ratio: 17 (ref 12–28)
BUN: 18 mg/dL (ref 8–27)
CO2: 25 mmol/L (ref 20–29)
Calcium: 9.2 mg/dL (ref 8.7–10.3)
Chloride: 103 mmol/L (ref 96–106)
Creatinine, Ser: 1.03 mg/dL — ABNORMAL HIGH (ref 0.57–1.00)
GFR calc Af Amer: 66 mL/min/{1.73_m2} (ref >59)
GFR calc non Af Amer: 57 mL/min/{1.73_m2} — ABNORMAL LOW (ref >59)
Glucose: 99 mg/dL (ref 65–99)
Potassium: 4.8 mmol/L (ref 3.5–5.2)
Sodium: 140 mmol/L (ref 134–144)

## 2019-12-20 LAB — HEPATIC FUNCTION PANEL
ALT: 11 IU/L (ref 0–32)
AST: 14 IU/L (ref 0–40)
Albumin: 4.4 g/dL (ref 3.8–4.8)
Alkaline Phosphatase: 84 IU/L (ref 48–121)
Bilirubin Total: <0.2 mg/dL (ref 0.0–1.2)
Bilirubin, Direct: 0.07 mg/dL (ref 0.00–0.40)
Total Protein: 6.7 g/dL (ref 6.0–8.5)

## 2019-12-20 LAB — LIPID PANEL
Chol/HDL Ratio: 5 ratio — ABNORMAL HIGH (ref 0.0–4.4)
Cholesterol, Total: 176 mg/dL (ref 100–199)
HDL: 35 mg/dL — ABNORMAL LOW (ref >39)
LDL Chol Calc (NIH): 118 mg/dL — ABNORMAL HIGH (ref 0–99)
Triglycerides: 127 mg/dL (ref 0–149)
VLDL Cholesterol Cal: 23 mg/dL (ref 5–40)

## 2019-12-20 LAB — TSH: TSH: 0.434 u[IU]/mL — ABNORMAL LOW (ref 0.450–4.500)

## 2019-12-22 ENCOUNTER — Encounter: Payer: Self-pay | Admitting: Family Medicine

## 2019-12-22 ENCOUNTER — Other Ambulatory Visit: Payer: Self-pay

## 2019-12-22 ENCOUNTER — Ambulatory Visit (INDEPENDENT_AMBULATORY_CARE_PROVIDER_SITE_OTHER): Payer: Medicare PPO | Admitting: Family Medicine

## 2019-12-22 VITALS — BP 128/82 | HR 82 | Temp 97.9°F | Ht 62.0 in | Wt 143.4 lb

## 2019-12-22 DIAGNOSIS — E039 Hypothyroidism, unspecified: Secondary | ICD-10-CM | POA: Diagnosis not present

## 2019-12-22 DIAGNOSIS — I10 Essential (primary) hypertension: Secondary | ICD-10-CM | POA: Diagnosis not present

## 2019-12-22 DIAGNOSIS — E876 Hypokalemia: Secondary | ICD-10-CM | POA: Diagnosis not present

## 2019-12-22 DIAGNOSIS — R110 Nausea: Secondary | ICD-10-CM

## 2019-12-22 MED ORDER — LEVOTHYROXINE SODIUM 125 MCG PO TABS: ORAL_TABLET | ORAL | 0 refills | Status: DC

## 2019-12-22 MED ORDER — RAMIPRIL 10 MG PO CAPS: ORAL_CAPSULE | ORAL | 1 refills | Status: DC

## 2019-12-22 MED ORDER — POTASSIUM CHLORIDE ER 10 MEQ PO TBCR: EXTENDED_RELEASE_TABLET | ORAL | 1 refills | Status: DC

## 2019-12-22 MED ORDER — PROMETHAZINE HCL 25 MG PO TABS: 25.0000 mg | ORAL_TABLET | Freq: Two times a day (BID) | ORAL | 0 refills | Status: DC

## 2019-12-22 NOTE — Patient Instructions (Signed)
Go to have thyroid lab repeated in 6 weeks.  Start the new thyroid medication at 132mcg.

## 2019-12-22 NOTE — Progress Notes (Signed)
Patient ID: Cheryl Gordon, female    DOB: [birth year], 64 y.o.   MRN: [id number]   Chief Complaint  Patient presents with  . Hypertension    follow up   Subjective:    HPI  Pt f/u htn, hypokalemia, and hypothyroidism.  Pt had covid illness in April '21.  Mild case.   H/o ARF and Cr 1.03.  Hypothyroidism-  Doing well. No SEs. Taking 161mcg daily, except wed and sat taking 1/2 tab.  Years ago pt having insomnia and tried many medications, not wanting to use opiates and anxiety meds. ambien- caused sleep walking. Pt phenergan puts her to sleep.  So last pcp was giving her phenergan for sleep. Pt taking only prn.   Medical History Cheryl Gordon has a past medical history of Chronic pain, Dependency on pain medication ([place]), Hyperlipidemia, Hypertension (2002), Hypothyroid, Interstitial cystitis, Migraine headache, and Spina bifida ([place]).   Outpatient Encounter Medications as of 12/22/2019  Medication Sig  . acetaminophen (TYLENOL) 500 MG tablet Take 1,000 mg by mouth every 6 (six) hours as needed for moderate pain.  . buprenorphine (SUBUTEX) 8 MG SUBL SL tablet Place 8 mg under the tongue daily.   . cholecalciferol (VITAMIN D) 1000 units tablet Take 2,000 Units by mouth daily.  . Garlic 8413 MG CAPS Take 2 capsules by mouth daily.  Marland Kitchen levothyroxine (SYNTHROID) 125 MCG tablet TAKE ONE (1) TABLET BY MOUTH EVERY DAY BEFORE BREAKFAST  . potassium chloride (KLOR-CON) 10 MEQ tablet TAKE ONE TABLET (10 MEQ TOTAL) BY MOUTH TWO TIMES DAILY.  Marland Kitchen promethazine (PHENERGAN) 25 MG tablet Take 1 tablet (25 mg total) by mouth 2 (two) times daily.  . ramipril (ALTACE) 10 MG capsule TAKE ONE (1) CAPSULE TWICE A DAY  . [DISCONTINUED] levothyroxine (SYNTHROID) 137 MCG tablet TAKE ONE (1) TABLET BY MOUTH EVERY DAY BEFORE BREAKFAST  . [DISCONTINUED] potassium chloride (KLOR-CON) 10 MEQ tablet TAKE ONE TABLET (10 MEQ TOTAL) BY MOUTH TWO TIMES DAILY.  . [DISCONTINUED] promethazine (PHENERGAN) 25 MG tablet TAKE  ONE TABLET BY MOUTH TWICE A DAY  . [DISCONTINUED] ramipril (ALTACE) 10 MG capsule TAKE ONE (1) CAPSULE TWICE A DAY   No facility-administered encounter medications on file as of 12/22/2019.     Review of Systems  Constitutional: Negative for chills and fever.  HENT: Negative for congestion, rhinorrhea and sore throat.   Respiratory: Negative for cough, shortness of breath and wheezing.   Cardiovascular: Negative for chest pain and leg swelling.  Gastrointestinal: Negative for abdominal pain, diarrhea, nausea and vomiting.  Genitourinary: Negative for dysuria and frequency.  Musculoskeletal: Negative for arthralgias and back pain.  Skin: Negative for rash.  Neurological: Negative for dizziness, weakness and headaches.     Vitals BP 128/82   Pulse 82   Temp 97.9 F (36.6 C) (Oral)   Ht 5\' 2"  (1.575 m)   Wt 143 lb 6.4 oz (65 kg)   SpO2 97%   BMI 26.23 kg/m   Objective:   Physical Exam Vitals and nursing note reviewed.  Constitutional:      Appearance: Normal appearance.  HENT:     Head: Normocephalic and atraumatic.     Nose: Nose normal.     Mouth/Throat:     Mouth: Mucous membranes are moist.     Pharynx: Oropharynx is clear.  Eyes:     Extraocular Movements: Extraocular movements intact.     Conjunctiva/sclera: Conjunctivae normal.     Pupils: Pupils are equal, round, and reactive to light.  Cardiovascular:  Rate and Rhythm: Normal rate and regular rhythm.     Pulses: Normal pulses.     Heart sounds: Normal heart sounds.  Pulmonary:     Effort: Pulmonary effort is normal.     Breath sounds: Normal breath sounds. No wheezing, rhonchi or rales.  Musculoskeletal:        General: Normal range of motion.     Right lower leg: No edema.     Left lower leg: No edema.  Skin:    General: Skin is warm and dry.     Findings: No lesion or rash.  Neurological:     General: No focal deficit present.     Mental Status: She is alert and oriented to person, place, and  time.  Psychiatric:        Mood and Affect: Mood normal.        Behavior: Behavior normal.      Assessment and Plan   1. Nausea - promethazine (PHENERGAN) 25 MG tablet; Take 1 tablet (25 mg total) by mouth 2 (two) times daily.  Dispense: 28 tablet; Refill: 0  2. Hypothyroidism, unspecified type - levothyroxine (SYNTHROID) 125 MCG tablet; TAKE ONE (1) TABLET BY MOUTH EVERY DAY BEFORE BREAKFAST  Dispense: 90 tablet; Refill: 0 - TSH; Future  3. Hypokalemia - potassium chloride (KLOR-CON) 10 MEQ tablet; TAKE ONE TABLET (10 MEQ TOTAL) BY MOUTH TWO TIMES DAILY.  Dispense: 90 tablet; Refill: 1  4. Essential hypertension - ramipril (ALTACE) 10 MG capsule; TAKE ONE (1) CAPSULE TWICE A DAY  Dispense: 90 capsule; Refill: 1    Pt given small course phenergan for nausea to use prn. Reviewed risk of being on this for long periods of time and can cause tardive dyskinesia. Pt voiced understanding.  Hypothyroid- stable cont meds.  Hypokalemia- reviewed labs, tsh low at 0.434.  Advising to take stable, levothyroxine 179mcg daily and recheck in 6 wks.   htn-stable, cont meds.   F/u 6 months and repeat labs in 6 wks.

## 2019-12-28 ENCOUNTER — Encounter: Payer: Self-pay | Admitting: Family Medicine

## 2020-01-22 ENCOUNTER — Telehealth: Payer: Self-pay | Admitting: Family Medicine

## 2020-01-22 ENCOUNTER — Other Ambulatory Visit: Payer: Self-pay | Admitting: *Deleted

## 2020-01-22 DIAGNOSIS — I10 Essential (primary) hypertension: Secondary | ICD-10-CM

## 2020-01-22 DIAGNOSIS — R5383 Other fatigue: Secondary | ICD-10-CM

## 2020-01-22 DIAGNOSIS — E039 Hypothyroidism, unspecified: Secondary | ICD-10-CM

## 2020-01-22 NOTE — Telephone Encounter (Signed)
See message below. Pt states since July 15th has been tired but this week has been worse. Pt states she was outside today and had to sit down. Was give out from just walking outside. Slept 9 hours last night and does not know why she is tired. Wants to know if you want to add anything else to her thyroid test. Will do bw tomorrow.

## 2020-01-22 NOTE — Telephone Encounter (Signed)
Labs all ordered together and patient notified.

## 2020-01-22 NOTE — Telephone Encounter (Signed)
Nothing needed but the tsh.  Thx, dr. Lovena Le

## 2020-01-22 NOTE — Telephone Encounter (Signed)
Pt is calling in stating she has been feeling really tired this week and sluggish. She thinks it is from the decrease in her thyroid medication and is going to have lab work today. She wanted to know what Dr. Lovena Le advises once lab work is back.

## 2020-01-24 LAB — CBC WITH DIFFERENTIAL/PLATELET
Basophils Absolute: 0 10*3/uL (ref 0.0–0.2)
Basos: 1 %
EOS (ABSOLUTE): 0.2 10*3/uL (ref 0.0–0.4)
Eos: 4 %
Hematocrit: 38 % (ref 34.0–46.6)
Hemoglobin: 12.7 g/dL (ref 11.1–15.9)
Immature Grans (Abs): 0 10*3/uL (ref 0.0–0.1)
Immature Granulocytes: 0 %
Lymphocytes Absolute: 0.9 10*3/uL (ref 0.7–3.1)
Lymphs: 16 %
MCH: 30.7 pg (ref 26.6–33.0)
MCHC: 33.4 g/dL (ref 31.5–35.7)
MCV: 92 fL (ref 79–97)
Monocytes Absolute: 0.5 10*3/uL (ref 0.1–0.9)
Monocytes: 9 %
Neutrophils Absolute: 3.9 10*3/uL (ref 1.4–7.0)
Neutrophils: 70 %
Platelets: 243 10*3/uL (ref 150–450)
RBC: 4.14 x10E6/uL (ref 3.77–5.28)
RDW: 12.6 % (ref 11.7–15.4)
WBC: 5.6 10*3/uL (ref 3.4–10.8)

## 2020-01-24 LAB — VITAMIN D 25 HYDROXY (VIT D DEFICIENCY, FRACTURES): Vit D, 25-Hydroxy: 72.1 ng/mL (ref 30.0–100.0)

## 2020-01-24 LAB — TSH: TSH: 0.142 u[IU]/mL — ABNORMAL LOW (ref 0.450–4.500)

## 2020-01-27 ENCOUNTER — Other Ambulatory Visit: Payer: Self-pay | Admitting: *Deleted

## 2020-01-27 DIAGNOSIS — E039 Hypothyroidism, unspecified: Secondary | ICD-10-CM

## 2020-01-27 MED ORDER — LEVOTHYROXINE SODIUM 100 MCG PO TABS: 100.0000 ug | ORAL_TABLET | Freq: Every day | ORAL | 2 refills | Status: DC

## 2020-01-27 NOTE — Telephone Encounter (Signed)
Lab results were explained to patient. Patient notified of results and change in medication dosage. Referral to endo in epic. Patient verbalized understanding.

## 2020-02-02 ENCOUNTER — Telehealth: Payer: Self-pay | Admitting: "Endocrinology

## 2020-02-02 NOTE — Telephone Encounter (Signed)
Dr Dorris Fetch,  Can this pt see Whitney?  Thanks

## 2020-02-02 NOTE — Telephone Encounter (Signed)
Yes

## 2020-02-03 ENCOUNTER — Encounter: Payer: Self-pay | Admitting: Nurse Practitioner

## 2020-02-04 ENCOUNTER — Ambulatory Visit: Payer: Medicare PPO | Admitting: Nurse Practitioner

## 2020-02-06 ENCOUNTER — Telehealth: Payer: Self-pay | Admitting: Family Medicine

## 2020-02-06 NOTE — Telephone Encounter (Signed)
Patient had appointment  to today at specialist office at 2 pm and they closed at 12 and dint notify patient so she wants to see Dr. Ronnald Collum  In [place] instead.

## 2020-02-20 ENCOUNTER — Ambulatory Visit (INDEPENDENT_AMBULATORY_CARE_PROVIDER_SITE_OTHER): Payer: Medicare PPO | Admitting: Nurse Practitioner

## 2020-02-20 ENCOUNTER — Other Ambulatory Visit: Payer: Self-pay

## 2020-02-20 ENCOUNTER — Telehealth: Payer: Self-pay | Admitting: Family Medicine

## 2020-02-20 ENCOUNTER — Encounter: Payer: Self-pay | Admitting: Nurse Practitioner

## 2020-02-20 VITALS — BP 131/86 | HR 83 | Ht 61.0 in | Wt 143.0 lb

## 2020-02-20 DIAGNOSIS — E039 Hypothyroidism, unspecified: Secondary | ICD-10-CM | POA: Diagnosis not present

## 2020-02-20 MED ORDER — FLUCONAZOLE 150 MG PO TABS: ORAL_TABLET | ORAL | 0 refills | Status: DC

## 2020-02-20 NOTE — Progress Notes (Signed)
Endocrinology Consult Note                                         02/20/2020, 9:58 AM   Cheryl Gordon is a 64 y.o.-year-old female patient being seen in consultation for hypothyroidism referred by Erven Colla, DO.   Past Medical History:  Diagnosis Date  . Chronic pain   . Dependency on pain medication ([place])   . Hyperlipidemia   . Hypertension 2002  . Hypothyroid   . Interstitial cystitis   . Migraine headache   . Spina bifida Cheryl Gordon)     Past Surgical History:  Procedure Laterality Date  . ABDOMINAL HYSTERECTOMY    . CESAREAN SECTION     x3  . COLONOSCOPY    . OOPHORECTOMY      Social History   Socioeconomic History  . Marital status: Widowed    Spouse name: Not on file  . Number of children: Not on file  . Years of education: Not on file  . Highest education level: Not on file  Occupational History  . Not on file  Tobacco Use  . Smoking status: Former Research scientist (life sciences)  . Smokeless tobacco: Never Used  Substance and Sexual Activity  . Alcohol use: No    Alcohol/week: 0.0 standard drinks  . Drug use: No  . Sexual activity: Not on file  Other Topics Concern  . Not on file  Social History Narrative  . Not on file   Social Determinants of Health   Financial Resource Strain:   . Difficulty of Paying Living Expenses: Not on file  Food Insecurity:   . Worried About Charity fundraiser in the Last Year: Not on file  . Ran Out of Food in the Last Year: Not on file  Transportation Needs:   . Lack of Transportation (Medical): Not on file  . Lack of Transportation (Non-Medical): Not on file  Physical Activity:   . Days of Exercise per Week: Not on file  . Minutes of Exercise per Session: Not on file  Stress:   . Feeling of Stress : Not on file  Social Connections:   . Frequency of Communication with Friends and Family: Not on file  . Frequency of Social Gatherings with Friends and  Family: Not on file  . Attends Religious Services: Not on file  . Active Member of Clubs or Organizations: Not on file  . Attends Archivist Meetings: Not on file  . Marital Status: Not on file    Family History  Problem Relation Age of Onset  . Hypertension Father   . Thyroid disease Father   . Hypertension Mother     Outpatient Encounter Medications as of 02/20/2020  Medication Sig  . acetaminophen (TYLENOL) 500 MG tablet Take 1,000 mg by mouth every 6 (six) hours as needed for moderate pain.  . buprenorphine (SUBUTEX) 8 MG SUBL SL tablet Place 8 mg under the tongue daily.   Marland Kitchen  cholecalciferol (VITAMIN D) 1000 units tablet Take 2,000 Units by mouth daily.  . Garlic 5643 MG CAPS Take 2 capsules by mouth daily.  Marland Kitchen levothyroxine (SYNTHROID) 100 MCG tablet Take 1 tablet (100 mcg total) by mouth daily.  . promethazine (PHENERGAN) 25 MG tablet Take 1 tablet (25 mg total) by mouth 2 (two) times daily.  . ramipril (ALTACE) 10 MG capsule TAKE ONE (1) CAPSULE TWICE A DAY  . FLUoxetine (PROZAC) 20 MG capsule Take 20 mg by mouth daily.  . potassium chloride (KLOR-CON) 10 MEQ tablet TAKE ONE TABLET (10 MEQ TOTAL) BY MOUTH TWO TIMES DAILY. (Patient taking differently: Take 10 mEq by mouth once. TAKE ONE TABLET (10 MEQ TOTAL) BY MOUTH TWO TIMES DAILY.)  . [DISCONTINUED] levothyroxine (SYNTHROID) 125 MCG tablet TAKE ONE (1) TABLET BY MOUTH EVERY DAY BEFORE BREAKFAST   No facility-administered encounter medications on file as of 02/20/2020.    ALLERGIES: Allergies  Allergen Reactions  . Sulfa Antibiotics Nausea And Vomiting  . Zithromax [Azithromycin] Other (See Comments)    unknown   VACCINATION STATUS: Immunization History  Administered Date(s) Administered  . Td 01/16/2002  . Tdap 06/19/2018     HPI    Cheryl Gordon  is a patient with the above medical history. she was diagnosed with hypothyroidism by Dr. Wolfgang Phoenix at approximate age of 1 years, which required subsequnt  initiation of thyroid hormone replacement. she was given various doses of levothyroxine over the years, currently on 100 micrograms. she reports compliance to this medication:  She reports she does not take this medication on an empty stomach and takes this medication with all of her other morning medications.  I reviewed patient's thyroid tests:  Lab Results  Component Value Date   TSH 0.142 (L) 01/23/2020   TSH 0.434 (L) 12/19/2019   TSH 0.549 07/23/2019   TSH 0.306 (L) 06/12/2018   TSH 0.412 (L) 12/31/2017   TSH 0.811 12/18/2016   TSH 5.350 (H) 08/25/2016   TSH 28.540 (H) 06/28/2016   TSH 1.000 12/21/2015   TSH 0.521 02/11/2015   FREET4 1.17 06/28/2016   FREET4 1.57 12/21/2015   FREET4 1.45 02/11/2015     Pt describes: - fatigue - feeling scatter brained  Pt denies feeling nodules in neck, hoarseness, dysphagia/odynophagia, SOB with lying down.  she endorses a family history of thyroid disorders in her father (hypothyroidism) who also has pernicious anemia.  No family history of thyroid cancer. No history of radiation therapy to head or neck.  No recent use of iodine supplements.  She denies use of hair, skin, and nails vitamins or biotin.  I reviewed her chart and she also has a history of hypertension, hyperlipidemia, spina bifida, and chronic pain.   ROS:  Review of systems  Constitutional: + Minimally fluctuating body weight,  current Body mass index is 27.02 kg/m. , + fatigue, no subjective hyperthermia, no subjective hypothermia Eyes: no blurry vision, no xerophthalmia ENT: no sore throat, no nodules palpated in throat, no dysphagia/odynophagia, no hoarseness Cardiovascular: no chest pain, no shortness of breath, no palpitations, no leg swelling Respiratory: no cough, no shortness of breath Gastrointestinal: no nausea/vomiting/diarrhea Musculoskeletal: no muscle/joint aches Skin: no rashes, no hyperemia Neurological: no tremors, no numbness, no tingling, no  dizziness Psychiatric: no depression, + anxiety, + mental fog   Physical Exam: BP 131/86 (BP Location: Left Arm, Patient Position: Sitting)   Pulse 83   Ht 5\' 1"  (1.549 m)   Wt 143 lb (64.9 kg)   BMI  27.02 kg/m  Wt Readings from Last 3 Encounters:  02/20/20 143 lb (64.9 kg)  12/22/19 143 lb 6.4 oz (65 kg)  06/12/18 139 lb 6.4 oz (63.2 kg)   BP Readings from Last 3 Encounters:  02/20/20 131/86  12/22/19 128/82  09/25/19 118/80     Physical Exam- Limited  Constitutional:  Body mass index is 27.02 kg/m. , not in acute distress, normal state of mind Eyes:  EOMI, no exophthalmos Neck: Supple Thyroid: nonpalpable, nontender, no obvious goiter noted, no nodules palpated Cardiovascular: RRR, no murmers, rubs, or gallops, no edema Respiratory: Adequate breathing efforts, no crackles, rales, rhonchi, or wheezing Musculoskeletal: no gross deformities, strength intact in all four extremities, no gross restriction of joint movements Skin:  no rashes, no hyperemia Neurological: + Slight tremor with outstretched hands, normal reflexes in all 4 extremities.   CMP ( most recent) CMP     Component Value Date/Time   NA 140 12/19/2019 0829   K 4.8 12/19/2019 0829   CL 103 12/19/2019 0829   CO2 25 12/19/2019 0829   GLUCOSE 99 12/19/2019 0829   GLUCOSE 122 (H) 08/20/2015 0508   BUN 18 12/19/2019 0829   CREATININE 1.03 (H) 12/19/2019 0829   CALCIUM 9.2 12/19/2019 0829   PROT 6.7 12/19/2019 0829   ALBUMIN 4.4 12/19/2019 0829   AST 14 12/19/2019 0829   ALT 11 12/19/2019 0829   ALKPHOS 84 12/19/2019 0829   BILITOT <0.2 12/19/2019 0829   GFRNONAA 57 (L) 12/19/2019 0829   GFRAA 66 12/19/2019 0829     Diabetic Labs (most recent): Lab Results  Component Value Date   HGBA1C 5.3 02/17/2016     Lipid Panel ( most recent) Lipid Panel     Component Value Date/Time   CHOL 176 12/19/2019 0829   TRIG 127 12/19/2019 0829   HDL 35 (L) 12/19/2019 0829   CHOLHDL 5.0 (H) 12/19/2019  0829   CHOLHDL 5.9 09/19/2012 1148   VLDL 31 09/19/2012 1148   LDLCALC 118 (H) 12/19/2019 0829   LABVLDL 23 12/19/2019 0829       Lab Results  Component Value Date   TSH 0.142 (L) 01/23/2020   TSH 0.434 (L) 12/19/2019   TSH 0.549 07/23/2019   TSH 0.306 (L) 06/12/2018   TSH 0.412 (L) 12/31/2017   TSH 0.811 12/18/2016   TSH 5.350 (H) 08/25/2016   TSH 28.540 (H) 06/28/2016   TSH 1.000 12/21/2015   TSH 0.521 02/11/2015   FREET4 1.17 06/28/2016   FREET4 1.57 12/21/2015   FREET4 1.45 02/11/2015       ASSESSMENT: 1. 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  PLAN:    Patient with long-standing hypothyroidism, on levothyroxine therapy. She is advised to continue with her Levothyroxine 100 mcg po daily before breakfast for now, but to start taking it properly.  On physical exam, patient does not  have gross goiter, thyroid nodules, or neck compression symptoms.  - We discussed about correct intake of levothyroxine, at fasting, with water, separated by at least 30 minutes from breakfast, and separated by more than 4 hours from calcium, iron, multivitamins, acid reflux medications (PPIs). -Patient is made aware of the fact that thyroid hormone replacement is needed for life, dose to be adjusted by periodic monitoring of thyroid function tests.  - Will check full thyroid panel prior to next visit as well as obtain a baseline thyroid ultrasound to assess thyroid anatomy.  - Time spent with the patient: 45 minutes, of  which >50% was spent in obtaining information about her symptoms, reviewing her previous labs, evaluations, and treatments, counseling her about her  hypothyroidism , and developing a plan to confirm the diagnosis and long term treatment as necessary. Please refer to " Patient Self Inventory" in the Media  tab for reviewed elements of pertinent patient history.  Cheryl Gordon participated in the discussions, expressed  understanding, and voiced agreement with the above plans.  All questions were answered to her satisfaction. she is encouraged to contact clinic should she have any questions or concerns prior to her return visit.   Follow Up Plan Return in about 8 weeks (around 04/16/2020) for Thyroid follow up, Previsit labs, thyroid ultrasound.  Rayetta Pigg, [facility] [address] [place], [place] [address] Phone: [phone] Fax: [phone]  02/20/2020, 9:58 AM  This note was partially dictated with voice recognition software. Similar sounding words can be transcribed inadequately or may not  be corrected upon review.

## 2020-02-20 NOTE — Telephone Encounter (Signed)
Pt is needing a refill on Fluconazole (Diflucan) 150 mg tablet Pt uses Cheryl Gordon   Pt call back [phone]

## 2020-02-20 NOTE — Patient Instructions (Signed)

## 2020-02-20 NOTE — Telephone Encounter (Signed)
Per protocol, Diflucan sent to pharmacy and pt is aware

## 2020-02-26 ENCOUNTER — Ambulatory Visit (HOSPITAL_COMMUNITY): Payer: Medicare PPO

## 2020-03-01 NOTE — Telephone Encounter (Signed)
What specialist? Thx, Dr. Darene Lamer

## 2020-03-03 ENCOUNTER — Ambulatory Visit (HOSPITAL_COMMUNITY)
Admission: RE | Admit: 2020-03-03 | Discharge: 2020-03-03 | Disposition: A | Discharge status: Home / Self Care | Payer: Medicare PPO | Source: Ambulatory Visit | Attending: Nurse Practitioner | Admitting: Nurse Practitioner

## 2020-03-03 ENCOUNTER — Other Ambulatory Visit: Payer: Self-pay

## 2020-03-03 DIAGNOSIS — E034 Atrophy of thyroid (acquired): Secondary | ICD-10-CM | POA: Diagnosis not present

## 2020-03-03 DIAGNOSIS — E041 Nontoxic single thyroid nodule: Secondary | ICD-10-CM | POA: Diagnosis not present

## 2020-03-03 DIAGNOSIS — E039 Hypothyroidism, unspecified: Secondary | ICD-10-CM | POA: Diagnosis not present

## 2020-03-25 DIAGNOSIS — F1110 Opioid abuse, uncomplicated: Secondary | ICD-10-CM | POA: Diagnosis not present

## 2020-04-07 DIAGNOSIS — E039 Hypothyroidism, unspecified: Secondary | ICD-10-CM | POA: Diagnosis not present

## 2020-04-08 LAB — THYROGLOBULIN ANTIBODY: Thyroglobulin Antibody: 7.7 IU/mL — ABNORMAL HIGH (ref 0.0–0.9)

## 2020-04-08 LAB — T4, FREE: Free T4: 1.49 ng/dL (ref 0.82–1.77)

## 2020-04-08 LAB — VITAMIN B12: Vitamin B-12: 328 pg/mL (ref 232–1245)

## 2020-04-08 LAB — T3, FREE: T3, Free: 2.4 pg/mL (ref 2.0–4.4)

## 2020-04-08 LAB — THYROID PEROXIDASE ANTIBODY: Thyroperoxidase Ab SerPl-aCnc: 120 IU/mL — ABNORMAL HIGH (ref 0–34)

## 2020-04-08 LAB — TSH: TSH: 1.21 u[IU]/mL (ref 0.450–4.500)

## 2020-04-16 ENCOUNTER — Other Ambulatory Visit: Payer: Self-pay

## 2020-04-16 ENCOUNTER — Encounter: Payer: Self-pay | Admitting: Nurse Practitioner

## 2020-04-16 ENCOUNTER — Ambulatory Visit: Payer: Medicare PPO | Admitting: Nurse Practitioner

## 2020-04-16 VITALS — BP 139/87 | HR 94 | Wt 140.0 lb

## 2020-04-16 DIAGNOSIS — E039 Hypothyroidism, unspecified: Secondary | ICD-10-CM | POA: Diagnosis not present

## 2020-04-16 NOTE — Patient Instructions (Signed)

## 2020-04-16 NOTE — Progress Notes (Signed)
Endocrinology Follow Up Note                                         04/16/2020, 11:48 AM  SUBJECTIVE:  Cheryl Gordon is a 64 y.o.-year-old female patient being seen in follow up after being seen in consultation for hypothyroidism referred by Erven Colla, DO.   Past Medical History:  Diagnosis Date  . Chronic pain   . Dependency on pain medication ([place])   . Hyperlipidemia   . Hypertension 2002  . Hypothyroid   . Interstitial cystitis   . Migraine headache   . Spina bifida [facility])     Past Surgical History:  Procedure Laterality Date  . ABDOMINAL HYSTERECTOMY    . CESAREAN SECTION     x3  . COLONOSCOPY    . OOPHORECTOMY      Social History   Socioeconomic History  . Marital status: Widowed    Spouse name: Not on file  . Number of children: Not on file  . Years of education: Not on file  . Highest education level: Not on file  Occupational History  . Not on file  Tobacco Use  . Smoking status: Former Research scientist (life sciences)  . Smokeless tobacco: Never Used  Substance and Sexual Activity  . Alcohol use: No    Alcohol/week: 0.0 standard drinks  . Drug use: No  . Sexual activity: Not on file  Other Topics Concern  . Not on file  Social History Narrative  . Not on file   Social Determinants of Health   Financial Resource Strain:   . Difficulty of Paying Living Expenses: Not on file  Food Insecurity:   . Worried About Charity fundraiser in the Last Year: Not on file  . Ran Out of Food in the Last Year: Not on file  Transportation Needs:   . Lack of Transportation (Medical): Not on file  . Lack of Transportation (Non-Medical): Not on file  Physical Activity:   . Days of Exercise per Week: Not on file  . Minutes of Exercise per Session: Not on file  Stress:   . Feeling of Stress : Not on file  Social Connections:   . Frequency of Communication with Friends and Family: Not on file  .  Frequency of Social Gatherings with Friends and Family: Not on file  . Attends Religious Services: Not on file  . Active Member of Clubs or Organizations: Not on file  . Attends Archivist Meetings: Not on file  . Marital Status: Not on file    Family History  Problem Relation Age of Onset  . Hypertension Father   . Thyroid disease Father   . Hypertension Mother     Outpatient Encounter Medications as of 04/16/2020  Medication Sig  . acetaminophen (TYLENOL) 500 MG tablet Take 1,000 mg by mouth every 6 (six) hours as needed for moderate pain.  . buprenorphine (SUBUTEX) 8 MG SUBL SL  tablet Place 8 mg under the tongue daily.   . cholecalciferol (VITAMIN D) 1000 units tablet Take 2,000 Units by mouth daily.  . fluconazole (DIFLUCAN) 150 MG tablet Take one tablet po now and one 3 days later  . FLUoxetine (PROZAC) 20 MG capsule Take 20 mg by mouth daily.  . Garlic 0076 MG CAPS Take 2 capsules by mouth daily.  Marland Kitchen levothyroxine (SYNTHROID) 100 MCG tablet Take 1 tablet (100 mcg total) by mouth daily.  . potassium chloride (KLOR-CON) 10 MEQ tablet TAKE ONE TABLET (10 MEQ TOTAL) BY MOUTH TWO TIMES DAILY. (Patient taking differently: Take 10 mEq by mouth once. TAKE ONE TABLET (10 MEQ TOTAL) BY MOUTH TWO TIMES DAILY.)  . promethazine (PHENERGAN) 25 MG tablet Take 1 tablet (25 mg total) by mouth 2 (two) times daily.  . ramipril (ALTACE) 10 MG capsule TAKE ONE (1) CAPSULE TWICE A DAY   No facility-administered encounter medications on file as of 04/16/2020.    ALLERGIES: Allergies  Allergen Reactions  . Sulfa Antibiotics Nausea And Vomiting  . Zithromax [Azithromycin] Other (See Comments)    unknown   VACCINATION STATUS: Immunization History  Administered Date(s) Administered  . Td 01/16/2002  . Tdap 06/19/2018     HPI    Cheryl Gordon  is a patient with the above medical history. she was diagnosed with hypothyroidism by Dr. Wolfgang Phoenix at approximate age of 19 years, which  required subsequnt initiation of thyroid hormone replacement. she was given various doses of levothyroxine over the years, currently on 100 micrograms. she reports compliance to this medication:  She has been taking it appropriately since last visit.  I reviewed patient's thyroid tests:  Lab Results  Component Value Date   TSH 1.210 04/07/2020   TSH 0.142 (L) 01/23/2020   TSH 0.434 (L) 12/19/2019   TSH 0.549 07/23/2019   TSH 0.306 (L) 06/12/2018   TSH 0.412 (L) 12/31/2017   TSH 0.811 12/18/2016   TSH 5.350 (H) 08/25/2016   TSH 28.540 (H) 06/28/2016   TSH 1.000 12/21/2015   FREET4 1.49 04/07/2020   FREET4 1.17 06/28/2016   FREET4 1.57 12/21/2015   FREET4 1.45 02/11/2015     Pt describes: - fatigue - feeling scatter brained (these symptoms have improved).  Pt denies feeling nodules in neck, hoarseness, dysphagia/odynophagia, SOB with lying down.  she endorses a family history of thyroid disorders in her father (hypothyroidism) who also has pernicious anemia.  No family history of thyroid cancer. No history of radiation therapy to head or neck.  No recent use of iodine supplements.  She denies use of hair, skin, and nails vitamins or biotin.  I reviewed her chart and she also has a history of hypertension, hyperlipidemia, spina bifida, and chronic pain.   ROS:  Review of systems  Constitutional: + Minimally fluctuating body weight,  current Body mass index is 26.45 kg/m. , + fatigue, no subjective hyperthermia, no subjective hypothermia Eyes: no blurry vision, no xerophthalmia ENT: no sore throat, no nodules palpated in throat, no dysphagia/odynophagia, no hoarseness Cardiovascular: no chest pain, no shortness of breath, no palpitations, no leg swelling Respiratory: no cough, no shortness of breath Gastrointestinal: no nausea/vomiting/diarrhea Musculoskeletal: no muscle/joint aches Skin: no rashes, no hyperemia Neurological: no tremors, no numbness, no tingling, no  dizziness Psychiatric: no depression, + anxiety   OBJECTIVE: BP 139/87 (BP Location: Left Arm, Patient Position: Sitting)   Pulse 94   Wt 140 lb (63.5 kg)   BMI 26.45 kg/m  Wt Readings from  Last 3 Encounters:  04/16/20 140 lb (63.5 kg)  02/20/20 143 lb (64.9 kg)  12/22/19 143 lb 6.4 oz (65 kg)   BP Readings from Last 3 Encounters:  04/16/20 139/87  02/20/20 131/86  12/22/19 128/82     Physical Exam- Limited  Constitutional:  Body mass index is 26.45 kg/m. , not in acute distress, normal state of mind Eyes:  EOMI, no exophthalmos Neck: Supple Thyroid: nonpalpable, nontender, no obvious goiter noted, no nodules palpated Cardiovascular: RRR, no murmers, rubs, or gallops, no edema Respiratory: Adequate breathing efforts, no crackles, rales, rhonchi, or wheezing Musculoskeletal: no gross deformities, strength intact in all four extremities, no gross restriction of joint movements Skin:  no rashes, no hyperemia Neurological: no tremor, normal reflexes in all 4 extremities.   CMP ( most recent) CMP     Component Value Date/Time   NA 140 12/19/2019 0829   K 4.8 12/19/2019 0829   CL 103 12/19/2019 0829   CO2 25 12/19/2019 0829   GLUCOSE 99 12/19/2019 0829   GLUCOSE 122 (H) 08/20/2015 0508   BUN 18 12/19/2019 0829   CREATININE 1.03 (H) 12/19/2019 0829   CALCIUM 9.2 12/19/2019 0829   PROT 6.7 12/19/2019 0829   ALBUMIN 4.4 12/19/2019 0829   AST 14 12/19/2019 0829   ALT 11 12/19/2019 0829   ALKPHOS 84 12/19/2019 0829   BILITOT <0.2 12/19/2019 0829   GFRNONAA 57 (L) 12/19/2019 0829   GFRAA 66 12/19/2019 0829     Diabetic Labs (most recent): Lab Results  Component Value Date   HGBA1C 5.3 02/17/2016     Lipid Panel ( most recent) Lipid Panel     Component Value Date/Time   CHOL 176 12/19/2019 0829   TRIG 127 12/19/2019 0829   HDL 35 (L) 12/19/2019 0829   CHOLHDL 5.0 (H) 12/19/2019 0829   CHOLHDL 5.9 09/19/2012 1148   VLDL 31 09/19/2012 1148   LDLCALC 118  (H) 12/19/2019 0829   LABVLDL 23 12/19/2019 0829       Lab Results  Component Value Date   TSH 1.210 04/07/2020   TSH 0.142 (L) 01/23/2020   TSH 0.434 (L) 12/19/2019   TSH 0.549 07/23/2019   TSH 0.306 (L) 06/12/2018   TSH 0.412 (L) 12/31/2017   TSH 0.811 12/18/2016   TSH 5.350 (H) 08/25/2016   TSH 28.540 (H) 06/28/2016   TSH 1.000 12/21/2015   FREET4 1.49 04/07/2020   FREET4 1.17 06/28/2016   FREET4 1.57 12/21/2015   FREET4 1.45 02/11/2015    Results for Bickhart, Katena ANN (MRN [id number]) as of 04/16/2020 11:27  Ref. Range 04/07/2020 15:49  TSH Latest Ref Range: 0.450 - 4.500 uIU/mL 1.210  Triiodothyronine,Free,Serum Latest Ref Range: 2.0 - 4.4 pg/mL 2.4  T4,Free(Direct) Latest Ref Range: 0.82 - 1.77 ng/dL 1.49  Thyroperoxidase Ab SerPl-aCnc Latest Ref Range: 0 - 34 IU/mL 120 (H)  Thyroglobulin Antibody Latest Ref Range: 0.0 - 0.9 IU/mL 7.7 (H)  -------------------------------------------------------------------------------------------------------------------------------- Thyroid US from 03/03/20 CLINICAL DATA:  Hypothyroidism  EXAM: THYROID ULTRASOUND  TECHNIQUE: Ultrasound examination of the thyroid gland and adjacent soft tissues was performed.  COMPARISON:  None.  FINDINGS: Parenchymal Echotexture: Markedly heterogenous  Isthmus: 3 mm  Right lobe: 3.5 x 1.4 x 0.9 cm  Left lobe: 5.3 x 1.3 x 1.4 cm  _________________________________________________________  Estimated total number of nodules >/= 1 cm: 0  Number of spongiform nodules >/=  2 cm not described below (TR1): 0  Number of mixed cystic and solid nodules >/= 1.5 cm not  described below ([place]): 0  _________________________________________________________  Markedly heterogeneous mixed echogenicity and atrophic thyroid gland with background pseudo nodularity. No discrete measurable thyroid nodule. No hypervascularity. No regional adenopathy.  IMPRESSION: Markedly heterogeneous atrophic  thyroid compatible with sequelae from medical thyroid disease/remote thyroiditis.  The above is in keeping with the ACR TI-RADS recommendations - J Am Coll Radiol 2017;14:587-595.   Electronically Signed   By: Jerilynn Mages.  Shick [name]   On: 03/03/2020 13:57 ------------------------------------------------------------------------------------------------------------------------------   ASSESSMENT / PLAN: 1. 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  She is advised to continue Levothyroxine 100 mcg po daily before breakfast.  - We discussed about correct intake of levothyroxine, at fasting, with water, separated by at least 30 minutes from breakfast, and separated by more than 4 hours from calcium, iron, multivitamins, acid reflux medications (PPIs). -Patient is made aware of the fact that thyroid hormone replacement is needed for life, dose to be adjusted by periodic monitoring of thyroid function tests.  Thyroid US was normal- no dedicated follow up needed.      - Time spent on this patient care encounter:  25 minutes of which 50% was spent in  counseling and the rest reviewing  her current and  previous labs / studies and medications  doses and developing a plan for long term care. Cheryl Gordon  participated in the discussions, expressed understanding, and voiced agreement with the above plans.  All questions were answered to her satisfaction. she is encouraged to contact clinic should she have any questions or concerns prior to her return visit.    Follow Up Plan Return in about 4 months (around 08/14/2020) for Thyroid follow up, Previsit labs.  Cheryl Gordon, [facility] [address] [place], [place] [address] Phone: [phone] Fax: [phone]  04/16/2020, 11:48 AM

## 2020-04-20 ENCOUNTER — Other Ambulatory Visit: Payer: Self-pay | Admitting: Family Medicine

## 2020-04-21 ENCOUNTER — Ambulatory Visit: Payer: Medicare PPO | Admitting: Nurse Practitioner

## 2020-05-11 ENCOUNTER — Other Ambulatory Visit: Payer: Self-pay | Admitting: *Deleted

## 2020-05-11 ENCOUNTER — Other Ambulatory Visit: Payer: Self-pay | Admitting: Family Medicine

## 2020-05-11 ENCOUNTER — Telehealth: Payer: Self-pay

## 2020-05-11 DIAGNOSIS — R110 Nausea: Secondary | ICD-10-CM

## 2020-05-11 MED ORDER — FLUCONAZOLE 150 MG PO TABS
ORAL_TABLET | ORAL | 2 refills | Status: DC
Start: 2020-05-11 — End: 2020-06-23

## 2020-05-11 NOTE — Telephone Encounter (Signed)
Pt is asking for a refill on diflucan she switch toothpaste and caused her mouth to break out she has a white substance forming in her mouth she has had this before and wanted the Dr to know why.   [phone] pt call back

## 2020-05-11 NOTE — Telephone Encounter (Signed)
Refill request already sent to provider

## 2020-05-11 NOTE — Telephone Encounter (Signed)
May have 2 refills on Diflucan

## 2020-05-12 NOTE — Telephone Encounter (Signed)
Refills sent and pt was notified yesterday.

## 2020-05-20 DIAGNOSIS — F1110 Opioid abuse, uncomplicated: Secondary | ICD-10-CM | POA: Diagnosis not present

## 2020-06-16 DIAGNOSIS — H2513 Age-related nuclear cataract, bilateral: Secondary | ICD-10-CM | POA: Diagnosis not present

## 2020-06-16 DIAGNOSIS — H40033 Anatomical narrow angle, bilateral: Secondary | ICD-10-CM | POA: Diagnosis not present

## 2020-06-23 ENCOUNTER — Other Ambulatory Visit: Payer: Self-pay

## 2020-06-23 ENCOUNTER — Encounter: Payer: Self-pay | Admitting: Family Medicine

## 2020-06-23 ENCOUNTER — Ambulatory Visit (INDEPENDENT_AMBULATORY_CARE_PROVIDER_SITE_OTHER): Payer: Medicare PPO | Admitting: Family Medicine

## 2020-06-23 VITALS — BP 130/87 | HR 87 | Temp 97.6°F | Ht 61.0 in | Wt 143.0 lb

## 2020-06-23 DIAGNOSIS — I10 Essential (primary) hypertension: Secondary | ICD-10-CM

## 2020-06-23 DIAGNOSIS — E039 Hypothyroidism, unspecified: Secondary | ICD-10-CM | POA: Diagnosis not present

## 2020-06-23 DIAGNOSIS — E876 Hypokalemia: Secondary | ICD-10-CM | POA: Diagnosis not present

## 2020-06-23 MED ORDER — LEVOTHYROXINE SODIUM 100 MCG PO TABS
ORAL_TABLET | ORAL | 1 refills | Status: DC
Start: 2020-06-23 — End: 2021-01-07

## 2020-06-23 MED ORDER — POTASSIUM CHLORIDE ER 10 MEQ PO TBCR: EXTENDED_RELEASE_TABLET | ORAL | 1 refills | Status: DC

## 2020-06-23 MED ORDER — RAMIPRIL 10 MG PO CAPS: ORAL_CAPSULE | ORAL | 1 refills | Status: DC

## 2020-06-23 NOTE — Progress Notes (Signed)
Patient ID: Cheryl Gordon, female    DOB: [birth year], 65 y.o.   MRN: [id number]   Chief Complaint  Patient presents with  . Hypertension   Subjective:    HPI  med check up on htn and hypokalemia.  Pt states she only has ever taken potassium once daily not twice daily like the directions say.   klor-con- taking it 1x per day instead of 2x per day.  Seeing Endo, for thyroid.  Pt stating they left the thyroid medication as it was at 162mcg synthroid, since was normal on the recheck of the labs.   Seeing NP Rayetta Pigg. Talked about her diet and other things that can contribute to her fatigue. Wasn't eating well.  And changed her diet and started exercising.  Feeling better.    Medical History Cheryl Gordon has a past medical history of Chronic pain, Dependency on pain medication ([place]), Hyperlipidemia, Hypertension (2002), Hypothyroid, Interstitial cystitis, Migraine headache, and Spina bifida ([place]).   Outpatient Encounter Medications as of 06/23/2020  Medication Sig  . acetaminophen (TYLENOL) 500 MG tablet Take 1,000 mg by mouth every 6 (six) hours as needed for moderate pain.  . buprenorphine (SUBUTEX) 8 MG SUBL SL tablet Place 8 mg under the tongue daily.   . cholecalciferol (VITAMIN D) 1000 units tablet Take 2,000 Units by mouth daily.  Marland Kitchen FLUoxetine (PROZAC) 20 MG capsule Take 20 mg by mouth daily.  . Garlic 3664 MG CAPS Take 2 capsules by mouth daily.  . promethazine (PHENERGAN) 25 MG tablet TAKE ONE TABLET BY MOUTH TWICE A DAY  . [DISCONTINUED] fluconazole (DIFLUCAN) 150 MG tablet TAKE ONE TABLET BY MOUTH NOW AND ONE TABLET THREE DAYS LATER.  . [DISCONTINUED] fluconazole (DIFLUCAN) 150 MG tablet Take one tablet po now and one 3 days later  . [DISCONTINUED] levothyroxine (SYNTHROID) 100 MCG tablet TAKE ONE TABLET (100MCG TOTAL) BY MOUTH DAILY  . [DISCONTINUED] potassium chloride (KLOR-CON) 10 MEQ tablet TAKE ONE TABLET (10 MEQ TOTAL) BY MOUTH TWO TIMES DAILY. (Patient taking  differently: Take 10 mEq by mouth once. TAKE ONE TABLET (10 MEQ TOTAL) BY MOUTH TWO TIMES DAILY.)  . [DISCONTINUED] ramipril (ALTACE) 10 MG capsule TAKE ONE (1) CAPSULE TWICE A DAY  . levothyroxine (SYNTHROID) 100 MCG tablet TAKE ONE TABLET (100MCG TOTAL) BY MOUTH DAILY  . potassium chloride (KLOR-CON) 10 MEQ tablet TAKE ONE TABLET (10 MEQ TOTAL) BY MOUTH daily.  . ramipril (ALTACE) 10 MG capsule TAKE ONE (1) CAPSULE TWICE A DAY   No facility-administered encounter medications on file as of 06/23/2020.     Review of Systems  Constitutional: Negative for chills and fever.  HENT: Negative for congestion, rhinorrhea and sore throat.   Respiratory: Negative for cough, shortness of breath and wheezing.   Cardiovascular: Negative for chest pain and leg swelling.  Gastrointestinal: Negative for abdominal pain, diarrhea, nausea and vomiting.  Genitourinary: Negative for dysuria and frequency.  Musculoskeletal: Negative for arthralgias and back pain.  Skin: Negative for rash.  Neurological: Negative for dizziness, weakness and headaches.     Vitals BP 130/87   Pulse 87   Temp 97.6 F (36.4 C)   Ht $R'5\' 1"'NA$  (1.549 m)   Wt 143 lb (64.9 kg)   SpO2 96%   BMI 27.02 kg/m   Objective:   Physical Exam Vitals and nursing note reviewed.  Constitutional:      Appearance: Normal appearance.  HENT:     Head: Normocephalic and atraumatic.     Nose: Nose normal.  Mouth/Throat:     Mouth: Mucous membranes are moist.     Pharynx: Oropharynx is clear.  Eyes:     Extraocular Movements: Extraocular movements intact.     Conjunctiva/sclera: Conjunctivae normal.     Pupils: Pupils are equal, round, and reactive to light.  Cardiovascular:     Rate and Rhythm: Normal rate and regular rhythm.     Pulses: Normal pulses.     Heart sounds: Normal heart sounds.  Pulmonary:     Effort: Pulmonary effort is normal.     Breath sounds: Normal breath sounds. No wheezing, rhonchi or rales.   Musculoskeletal:        General: Normal range of motion.     Right lower leg: No edema.     Left lower leg: No edema.  Skin:    General: Skin is warm and dry.     Findings: No lesion or rash.  Neurological:     General: No focal deficit present.     Mental Status: She is alert and oriented to person, place, and time.  Psychiatric:        Mood and Affect: Mood normal.        Behavior: Behavior normal.      Assessment and Plan   1. Hypothyroidism, unspecified type - TSH  2. Hypokalemia - potassium chloride (KLOR-CON) 10 MEQ tablet; TAKE ONE TABLET (10 MEQ TOTAL) BY MOUTH daily.  Dispense: 90 tablet; Refill: 1 - CMP14+EGFR  3. Essential hypertension - ramipril (ALTACE) 10 MG capsule; TAKE ONE (1) CAPSULE TWICE A DAY  Dispense: 90 capsule; Refill: 1 - CBC - CMP14+EGFR - Lipid panel  H/o anxiety/depression- Pt seeing psychiatrist for prozac.   Saw eye doctor and stating has early glaucoma.  going back in 26mo to decide on the procedure on eyes for glaucoma and cataracts.  htn- stable. Cont meds,ramapril 10mg  daily. Reck labs.  Hypokalemia- stable. K at 4.9. Cont klor-con.  Taking it 1x per day instead of 2x per day and labs have been normal.  Will cont with it at 1x per day, klor-con 63mEq. Will cont to monitor.  Hypothyroid- pt to get labs.  Pt seeing Endocrinology for this. Cont synthroid.  F/u 46mo.

## 2020-07-05 DIAGNOSIS — I10 Essential (primary) hypertension: Secondary | ICD-10-CM | POA: Diagnosis not present

## 2020-07-05 DIAGNOSIS — E876 Hypokalemia: Secondary | ICD-10-CM | POA: Diagnosis not present

## 2020-07-05 DIAGNOSIS — E039 Hypothyroidism, unspecified: Secondary | ICD-10-CM | POA: Diagnosis not present

## 2020-07-06 LAB — LIPID PANEL
Chol/HDL Ratio: 4.8 ratio — ABNORMAL HIGH (ref 0.0–4.4)
Cholesterol, Total: 218 mg/dL — ABNORMAL HIGH (ref 100–199)
HDL: 45 mg/dL (ref >39)
LDL Chol Calc (NIH): 158 mg/dL — ABNORMAL HIGH (ref 0–99)
Triglycerides: 84 mg/dL (ref 0–149)
VLDL Cholesterol Cal: 15 mg/dL (ref 5–40)

## 2020-07-06 LAB — CMP14+EGFR
ALT: 12 IU/L (ref 0–32)
AST: 13 IU/L (ref 0–40)
Albumin/Globulin Ratio: 1.9 (ref 1.2–2.2)
Albumin: 4.4 g/dL (ref 3.8–4.8)
Alkaline Phosphatase: 75 IU/L (ref 44–121)
BUN/Creatinine Ratio: 19 (ref 12–28)
BUN: 22 mg/dL (ref 8–27)
Bilirubin Total: 0.4 mg/dL (ref 0.0–1.2)
CO2: 25 mmol/L (ref 20–29)
Calcium: 9.5 mg/dL (ref 8.7–10.3)
Chloride: 102 mmol/L (ref 96–106)
Creatinine, Ser: 1.14 mg/dL — ABNORMAL HIGH (ref 0.57–1.00)
GFR calc Af Amer: 58 mL/min/{1.73_m2} — ABNORMAL LOW (ref >59)
GFR calc non Af Amer: 51 mL/min/{1.73_m2} — ABNORMAL LOW (ref >59)
Globulin, Total: 2.3 g/dL (ref 1.5–4.5)
Glucose: 99 mg/dL (ref 65–99)
Potassium: 4.9 mmol/L (ref 3.5–5.2)
Sodium: 141 mmol/L (ref 134–144)
Total Protein: 6.7 g/dL (ref 6.0–8.5)

## 2020-07-06 LAB — TSH: TSH: 1.34 u[IU]/mL (ref 0.450–4.500)

## 2020-07-06 LAB — CBC
Hematocrit: 37.5 % (ref 34.0–46.6)
Hemoglobin: 12.3 g/dL (ref 11.1–15.9)
MCH: 30.2 pg (ref 26.6–33.0)
MCHC: 32.8 g/dL (ref 31.5–35.7)
MCV: 92 fL (ref 79–97)
Platelets: 202 10*3/uL (ref 150–450)
RBC: 4.07 x10E6/uL (ref 3.77–5.28)
RDW: 12.3 % (ref 11.7–15.4)
WBC: 5.7 10*3/uL (ref 3.4–10.8)

## 2020-07-15 DIAGNOSIS — F1110 Opioid abuse, uncomplicated: Secondary | ICD-10-CM | POA: Diagnosis not present

## 2020-07-20 ENCOUNTER — Other Ambulatory Visit: Payer: Self-pay | Admitting: Family Medicine

## 2020-07-20 DIAGNOSIS — R110 Nausea: Secondary | ICD-10-CM

## 2020-07-20 NOTE — Telephone Encounter (Signed)
Left message to return call 

## 2020-07-20 NOTE — Telephone Encounter (Signed)
We had conversation about taking phenergan long term for sleep.  Not appropriate to take for this.  Causes long term side effects.   Would recommend something else.  Trazodone or buspar if needed.   Let me know if she wants to try this.   Thx. Dr. Lovena Le

## 2020-07-21 DIAGNOSIS — H40033 Anatomical narrow angle, bilateral: Secondary | ICD-10-CM | POA: Diagnosis not present

## 2020-07-21 DIAGNOSIS — H2513 Age-related nuclear cataract, bilateral: Secondary | ICD-10-CM | POA: Diagnosis not present

## 2020-07-21 MED ORDER — TRAZODONE HCL 50 MG PO TABS: ORAL_TABLET | ORAL | 0 refills | Status: DC

## 2020-07-21 NOTE — Telephone Encounter (Signed)
Pt would like to try something else for sleep either trazodone or buspar.

## 2020-07-21 NOTE — Addendum Note (Signed)
Addended by: Erven Colla on: 07/21/2020 11:45 AM   Modules accepted: Orders

## 2020-07-28 DIAGNOSIS — Z1231 Encounter for screening mammogram for malignant neoplasm of breast: Secondary | ICD-10-CM | POA: Diagnosis not present

## 2020-07-28 DIAGNOSIS — Z6826 Body mass index (BMI) 26.0-26.9, adult: Secondary | ICD-10-CM | POA: Diagnosis not present

## 2020-07-28 DIAGNOSIS — Z1272 Encounter for screening for malignant neoplasm of vagina: Secondary | ICD-10-CM | POA: Diagnosis not present

## 2020-07-28 DIAGNOSIS — Z124 Encounter for screening for malignant neoplasm of cervix: Secondary | ICD-10-CM | POA: Diagnosis not present

## 2020-07-28 LAB — HM PAP SMEAR: HM Pap smear: NEGATIVE

## 2020-08-18 ENCOUNTER — Ambulatory Visit: Payer: Medicare PPO | Admitting: Nurse Practitioner

## 2020-08-20 DIAGNOSIS — H25811 Combined forms of age-related cataract, right eye: Secondary | ICD-10-CM | POA: Diagnosis not present

## 2020-08-25 ENCOUNTER — Ambulatory Visit: Payer: Medicare PPO | Admitting: Nurse Practitioner

## 2020-09-01 ENCOUNTER — Ambulatory Visit: Payer: Medicare PPO | Admitting: Nurse Practitioner

## 2020-09-03 DIAGNOSIS — H25812 Combined forms of age-related cataract, left eye: Secondary | ICD-10-CM | POA: Diagnosis not present

## 2020-09-16 DIAGNOSIS — F1110 Opioid abuse, uncomplicated: Secondary | ICD-10-CM | POA: Diagnosis not present

## 2020-10-05 ENCOUNTER — Other Ambulatory Visit: Payer: Self-pay | Admitting: Family Medicine

## 2020-10-05 DIAGNOSIS — I10 Essential (primary) hypertension: Secondary | ICD-10-CM

## 2020-11-08 DIAGNOSIS — N3946 Mixed incontinence: Secondary | ICD-10-CM | POA: Diagnosis not present

## 2020-11-08 DIAGNOSIS — R3915 Urgency of urination: Secondary | ICD-10-CM | POA: Diagnosis not present

## 2020-11-08 DIAGNOSIS — N3010 Interstitial cystitis (chronic) without hematuria: Secondary | ICD-10-CM | POA: Diagnosis not present

## 2020-11-08 DIAGNOSIS — R351 Nocturia: Secondary | ICD-10-CM | POA: Diagnosis not present

## 2020-11-09 ENCOUNTER — Ambulatory Visit (INDEPENDENT_AMBULATORY_CARE_PROVIDER_SITE_OTHER): Payer: Medicare PPO

## 2020-11-09 DIAGNOSIS — Z0000 Encounter for general adult medical examination without abnormal findings: Secondary | ICD-10-CM

## 2020-11-09 NOTE — Patient Instructions (Signed)
Cheryl Gordon , Thank you for taking time to come for your Medicare Wellness Visit. I appreciate your ongoing commitment to your health goals. Please review the following plan we discussed and let me know if I can assist you in the future.   Screening recommendations/referrals: Colonoscopy: Up to date, next due 01/09/2026 Mammogram: Please have your OB/GYN fax [place] the report of your last mammogram so we may update you chart Bone Density: Please have your OB/GYN fax [place] the report of your last Bone Density so we may update your chart Recommended yearly ophthalmology/optometry visit for glaucoma screening and checkup Recommended yearly dental visit for hygiene and checkup  Vaccinations: Influenza vaccine: patient declined Pneumococcal vaccine: Currently due, you may receive at your next in person office visit  Tdap vaccine: Up to date, next due 06/19/2028 Shingles vaccine: Currently due for Shingrix, if you would like to receive we recommend that you do so at your local pharmacy    Advanced directives: Pleas bring in a copy of your advanced medical directives so that we may scan them into your chart.   Conditions/risks identified: none   Next appointment: 12/22/2020 @ 1:10 PM with Dr. Elvia Collum    Preventive Care 65 Years and Older, Female Preventive care refers to lifestyle choices and visits with your health care provider that can promote health and wellness. What does preventive care include?  A yearly physical exam. This is also called an annual well check.  Dental exams once or twice a year.  Routine eye exams. Ask your health care provider how often you should have your eyes checked.  Personal lifestyle choices, including:  Daily care of your teeth and gums.  Regular physical activity.  Eating a healthy diet.  Avoiding tobacco and drug use.  Limiting alcohol use.  Practicing safe sex.  Taking low-dose aspirin every day.  Taking vitamin and mineral supplements as  recommended by your health care provider. What happens during an annual well check? The services and screenings done by your health care provider during your annual well check will depend on your age, overall health, lifestyle risk factors, and family history of disease. Counseling  Your health care provider may ask you questions about your:  Alcohol use.  Tobacco use.  Drug use.  Emotional well-being.  Home and relationship well-being.  Sexual activity.  Eating habits.  History of falls.  Memory and ability to understand (cognition).  Work and work Statistician.  Reproductive health. Screening  You may have the following tests or measurements:  Height, weight, and BMI.  Blood pressure.  Lipid and cholesterol levels. These may be checked every 5 years, or more frequently if you are over 40 years old.  Skin check.  Lung cancer screening. You may have this screening every year starting at age 21 if you have a 30-pack-year history of smoking and currently smoke or have quit within the past 15 years.  Fecal occult blood test (FOBT) of the stool. You may have this test every year starting at age 7.  Flexible sigmoidoscopy or colonoscopy. You may have a sigmoidoscopy every 5 years or a colonoscopy every 10 years starting at age 72.  Hepatitis C blood test.  Hepatitis B blood test.  Sexually transmitted disease (STD) testing.  Diabetes screening. This is done by checking your blood sugar (glucose) after you have not eaten for a while (fasting). You may have this done every 1-3 years.  Bone density scan. This is done to screen for osteoporosis. You may  have this done starting at age 70.  Mammogram. This may be done every 1-2 years. Talk to your health care provider about how often you should have regular mammograms. Talk with your health care provider about your test results, treatment options, and if necessary, the need for more tests. Vaccines  Your health care  provider may recommend certain vaccines, such as:  Influenza vaccine. This is recommended every year.  Tetanus, diphtheria, and acellular pertussis (Tdap, Td) vaccine. You may need a Td booster every 10 years.  Zoster vaccine. You may need this after age 59.  Pneumococcal 13-valent conjugate (PCV13) vaccine. One dose is recommended after age 49.  Pneumococcal polysaccharide (PPSV23) vaccine. One dose is recommended after age 30. Talk to your health care provider about which screenings and vaccines you need and how often you need them. This information is not intended to replace advice given to you by your health care provider. Make sure you discuss any questions you have with your health care provider. Document Released: 06/18/2015 Document Revised: 02/09/2016 Document Reviewed: 03/23/2015 Elsevier Interactive Patient Education  2017 [employer] Prevention in the Home Falls can cause injuries. They can happen to people of all ages. There are many things you can do to make your home safe and to help prevent falls. What can I do on the outside of my home?  Regularly fix the edges of walkways and driveways and fix any cracks.  Remove anything that might make you trip as you walk through a door, such as a raised step or threshold.  Trim any bushes or trees on the path to your home.  Use bright outdoor lighting.  Clear any walking paths of anything that might make someone trip, such as rocks or tools.  Regularly check to see if handrails are loose or broken. Make sure that both sides of any steps have handrails.  Any raised decks and porches should have guardrails on the edges.  Have any leaves, snow, or ice cleared regularly.  Use sand or salt on walking paths during winter.  Clean up any spills in your garage right away. This includes oil or grease spills. What can I do in the bathroom?  Use night lights.  Install grab bars by the toilet and in the tub and shower. Do  not use towel bars as grab bars.  Use non-skid mats or decals in the tub or shower.  If you need to sit down in the shower, use a plastic, non-slip stool.  Keep the floor dry. Clean up any water that spills on the floor as soon as it happens.  Remove soap buildup in the tub or shower regularly.  Attach bath mats securely with double-sided non-slip rug tape.  Do not have throw rugs and other things on the floor that can make you trip. What can I do in the bedroom?  Use night lights.  Make sure that you have a light by your bed that is easy to reach.  Do not use any sheets or blankets that are too big for your bed. They should not hang down onto the floor.  Have a firm chair that has side arms. You can use this for support while you get dressed.  Do not have throw rugs and other things on the floor that can make you trip. What can I do in the kitchen?  Clean up any spills right away.  Avoid walking on wet floors.  Keep items that you use a lot in  easy-to-reach places.  If you need to reach something above you, use a strong step stool that has a grab bar.  Keep electrical cords out of the way.  Do not use floor polish or wax that makes floors slippery. If you must use wax, use non-skid floor wax.  Do not have throw rugs and other things on the floor that can make you trip. What can I do with my stairs?  Do not leave any items on the stairs.  Make sure that there are handrails on both sides of the stairs and use them. Fix handrails that are broken or loose. Make sure that handrails are as long as the stairways.  Check any carpeting to make sure that it is firmly attached to the stairs. Fix any carpet that is loose or worn.  Avoid having throw rugs at the top or bottom of the stairs. If you do have throw rugs, attach them to the floor with carpet tape.  Make sure that you have a light switch at the top of the stairs and the bottom of the stairs. If you do not have them,  ask someone to add them for you. What else can I do to help prevent falls?  Wear shoes that:  Do not have high heels.  Have rubber bottoms.  Are comfortable and fit you well.  Are closed at the toe. Do not wear sandals.  If you use a stepladder:  Make sure that it is fully opened. Do not climb a closed stepladder.  Make sure that both sides of the stepladder are locked into place.  Ask someone to hold it for you, if possible.  Clearly mark and make sure that you can see:  Any grab bars or handrails.  First and last steps.  Where the edge of each step is.  Use tools that help you move around (mobility aids) if they are needed. These include:  Canes.  Walkers.  Scooters.  Crutches.  Turn on the lights when you go into a dark area. Replace any light bulbs as soon as they burn out.  Set up your furniture so you have a clear path. Avoid moving your furniture around.  If any of your floors are uneven, fix them.  If there are any pets around you, be aware of where they are.  Review your medicines with your doctor. Some medicines can make you feel dizzy. This can increase your chance of falling. Ask your doctor what other things that you can do to help prevent falls. This information is not intended to replace advice given to you by your health care provider. Make sure you discuss any questions you have with your health care provider. Document Released: 03/18/2009 Document Revised: 10/28/2015 Document Reviewed: 06/26/2014 Elsevier Interactive Patient Education  2017 [employer].

## 2020-11-09 NOTE — Progress Notes (Addendum)
Subjective:   Cheryl Gordon is a 65 y.o. female who presents for an Initial Medicare Annual Wellness Visit.  I connected with Cheryl Gordon today by telephone and verified that I am speaking with the correct person using two identifiers. Location patient: home Location provider: work Persons participating in the virtual visit: patient, provider.   I discussed the limitations, risks, security and privacy concerns of performing an evaluation and management service by telephone and the availability of in person appointments. I also discussed with the patient that there may be a patient responsible charge related to this service. The patient expressed understanding and verbally consented to this telephonic visit.    Interactive audio and video telecommunications were attempted between this provider and patient, however failed, due to patient having technical difficulties OR patient did not have access to video capability.  We continued and completed visit with audio only.      Review of Systems    N/A  Cardiac Risk Factors include: advanced age (>19men, >78 women);hypertension     Objective:    There were no vitals filed for this visit. There is no height or weight on file to calculate BMI.  Advanced Directives 11/09/2020 08/19/2015 08/17/2015 08/17/2015  Does Patient Have a Medical Advance Directive? Yes No No No  Type of Advance Directive Living will;Healthcare Power of Attorney - - -  Does patient want to make changes to medical advance directive? No - Patient declined - - -  Copy of [place] in Chart? No - copy requested - - -  Would patient like information on creating a medical advance directive? - Yes - Educational materials given Yes - Spiritual care consult ordered No - patient declined information    Current Medications (verified) Outpatient Encounter Medications as of 11/09/2020  Medication Sig  . acetaminophen (TYLENOL) 500 MG tablet Take 1,000 mg by  mouth every 6 (six) hours as needed for moderate pain.  . buprenorphine (SUBUTEX) 8 MG SUBL SL tablet Place 8 mg under the tongue daily.   . cholecalciferol (VITAMIN D) 1000 units tablet Take 2,000 Units by mouth daily.  Marland Kitchen FLUoxetine (PROZAC) 20 MG capsule Take 20 mg by mouth daily.  . Garlic 7948 MG CAPS Take 2 capsules by mouth daily.  Marland Kitchen levothyroxine (SYNTHROID) 100 MCG tablet TAKE ONE TABLET (100MCG TOTAL) BY MOUTH DAILY  . MULTIPLE VITAMIN PO Take by mouth.  . potassium chloride (KLOR-CON) 10 MEQ tablet TAKE ONE TABLET (10 MEQ TOTAL) BY MOUTH daily.  . ramipril (ALTACE) 10 MG capsule TAKE ONE (1) CAPSULE BY MOUTH TWICE A DAY. (EVERY 12 HOURS.)  . traZODone (DESYREL) 50 MG tablet Take 1-2 tab p.o. qhs prn insomnia. (Patient not taking: Reported on 11/09/2020)   No facility-administered encounter medications on file as of 11/09/2020.    Allergies (verified) Sulfa antibiotics and Zithromax [azithromycin]   History: Past Medical History:  Diagnosis Date  . Chronic pain   . Dependency on pain medication ([place])   . Hyperlipidemia   . Hypertension 2002  . Hypothyroid   . Interstitial cystitis   . Migraine headache   . Spina bifida [facility])    Past Surgical History:  Procedure Laterality Date  . ABDOMINAL HYSTERECTOMY    . CESAREAN SECTION     x3  . COLONOSCOPY    . OOPHORECTOMY     Family History  Problem Relation Age of Onset  . Hypertension Father   . Thyroid disease Father   . Hypertension Mother  Social History   Socioeconomic History  . Marital status: Widowed    Spouse name: Not on file  . Number of children: Not on file  . Years of education: Not on file  . Highest education level: Not on file  Occupational History  . Not on file  Tobacco Use  . Smoking status: Former Research scientist (life sciences)  . Smokeless tobacco: Never Used  Substance and Sexual Activity  . Alcohol use: No    Alcohol/week: 0.0 standard drinks  . Drug use: No  . Sexual activity: Not on file  Other Topics  Concern  . Not on file  Social History Narrative  . Not on file   Social Determinants of Health   Financial Resource Strain: Low Risk   . Difficulty of Paying Living Expenses: Not hard at all  Food Insecurity: No Food Insecurity  . Worried About Charity fundraiser in the Last Year: Never true  . Ran Out of Food in the Last Year: Never true  Transportation Needs: No Transportation Needs  . Lack of Transportation (Medical): No  . Lack of Transportation (Non-Medical): No  Physical Activity: Inactive  . Days of Exercise per Week: 0 days  . Minutes of Exercise per Session: 0 min  Stress: No Stress Concern Present  . Feeling of Stress : Only a little  Social Connections: Moderately Isolated  . Frequency of Communication with Friends and Family: More than three times a week  . Frequency of Social Gatherings with Friends and Family: More than three times a week  . Attends Religious Services: More than 4 times per year  . Active Member of Clubs or Organizations: No  . Attends Archivist Meetings: Never  . Marital Status: Widowed    Tobacco Counseling Counseling given: Not Answered   Clinical Intake:  Pre-visit preparation completed: Yes  Pain : No/denies pain     Nutritional Risks: None Diabetes: No  How often do you need to have someone help you when you read instructions, pamphlets, or other written materials from your doctor or pharmacy?: 1 - Never  Diabetic?No   Interpreter Needed?: No  Information entered by :: [place] of Daily Living In your present state of health, do you have any difficulty performing the following activities: 11/09/2020  Hearing? N  Vision? N  Difficulty concentrating or making decisions? N  Walking or climbing stairs? N  Dressing or bathing? N  Doing errands, shopping? N  Preparing Food and eating ? N  Using the Toilet? N  In the past six months, have you accidently leaked urine? N  Do you have problems with  loss of bowel control? N  Managing your Medications? N  Managing your Finances? N  Housekeeping or managing your Housekeeping? N  Some recent data might be hidden    Patient Care Team: Erven Colla, DO as PCP - General (Family Medicine)  Indicate any recent Medical Services you may have received from other than [facility] providers in the past year (date may be approximate).     Assessment:   This is a routine wellness examination for Cheryl Gordon.  Hearing/Vision screen  Hearing Screening   125Hz  250Hz  500Hz  1000Hz  2000Hz  3000Hz  4000Hz  6000Hz  8000Hz   Right ear:           Left ear:           Vision Screening Comments: Patient gets eyes examined once per year   Dietary issues and exercise activities discussed: Current Exercise Habits: The patient  does not participate in regular exercise at present  Goals Addressed            This Visit's Progress   . Patient Stated       I would like to have more me time.      Depression Screen PHQ 2/9 Scores 11/09/2020 06/23/2020 01/23/2017  PHQ - 2 Score 0 0 1  PHQ- 9 Score - 0 -    Fall Risk Fall Risk  11/09/2020 06/23/2020 12/22/2019 01/23/2017  Falls in the past year? 0 0 0 No  Number falls in past yr: 0 - - -  Injury with Fall? 0 - - -  Risk for fall due to : No Fall Risks - - -  Follow up Falls evaluation completed;Falls prevention discussed Falls evaluation completed Falls evaluation completed -    FALL RISK PREVENTION PERTAINING TO THE HOME:  Any stairs in or around the home? No  If so, are there any without handrails? No  Home free of loose throw rugs in walkways, pet beds, electrical cords, etc? Yes  Adequate lighting in your home to reduce risk of falls? Yes   ASSISTIVE DEVICES UTILIZED TO PREVENT FALLS:  Life alert? No  Use of a cane, walker or w/c? No  Grab bars in the bathroom? No  Shower chair or bench in shower? No  Elevated toilet seat or a handicapped toilet? No    Cognitive Function:   Normal cognitive status  assessed by direct observation by this Nurse Health Advisor. No abnormalities found.        Immunizations Immunization History  Administered Date(s) Administered  . PFIZER Comirnaty(Gray Top)Covid-19 Tri-Sucrose Vaccine 05/14/2020, 06/13/2020  . Td 01/16/2002  . Tdap 06/19/2018    TDAP status: Up to date  Flu Vaccine status: Due, Education has been provided regarding the importance of this vaccine. Advised may receive this vaccine at local pharmacy or Health Dept. Aware to provide a copy of the vaccination record if obtained from local pharmacy or Health Dept. Verbalized acceptance and understanding.  Pneumococcal vaccine status: Due, Education has been provided regarding the importance of this vaccine. Advised may receive this vaccine at local pharmacy or Health Dept. Aware to provide a copy of the vaccination record if obtained from local pharmacy or Health Dept. Verbalized acceptance and understanding.  Covid-19 vaccine status: Completed vaccines  Qualifies for Shingles Vaccine? Yes   Zostavax completed No   Shingrix Completed?: No.    Education has been provided regarding the importance of this vaccine. Patient has been advised to call insurance company to determine out of pocket expense if they have not yet received this vaccine. Advised may also receive vaccine at local pharmacy or Health Dept. Verbalized acceptance and understanding.  Screening Tests Health Maintenance  Topic Date Due  . Pneumococcal Vaccine 32-27 Years old (1 of 4 - PCV13) Never done  . Hepatitis C Screening  Never done  . Zoster Vaccines- Shingrix (1 of 2) Never done  . DEXA SCAN  09/29/2019  . PNA vac Low Risk Adult (1 of 2 - PCV13) Never done  . COVID-19 Vaccine (3 - Booster for Pfizer series) 11/11/2020  . MAMMOGRAM  04/05/2022  . COLONOSCOPY (Pts 45-59yrs Insurance coverage will need to be confirmed)  01/09/2026  . TETANUS/TDAP  06/19/2028  . HPV VACCINES  Aged Out  . INFLUENZA VACCINE  Discontinued     Health Maintenance  Health Maintenance Due  Topic Date Due  . Pneumococcal Vaccine 74-31 Years old (1 of  4 - PCV13) Never done  . Hepatitis C Screening  Never done  . Zoster Vaccines- Shingrix (1 of 2) Never done  . DEXA SCAN  09/29/2019  . PNA vac Low Risk Adult (1 of 2 - PCV13) Never done  . COVID-19 Vaccine (3 - Booster for Pfizer series) 11/11/2020    Colorectal cancer screening: Type of screening: Colonoscopy. Completed 01/10/2016. Repeat every 10 years  Mammogram status: Ordered 11/09/2020. Pt provided with contact info and advised to call to schedule appt.   Bone Density status: Ordered 11/09/2020. Pt provided with contact info and advised to call to schedule appt.  Lung Cancer Screening: (Low Dose CT Chest recommended if Age 37-80 years, 30 pack-year currently smoking OR have quit w/in 15years.) does not qualify.   Lung Cancer Screening Referral: N/A   Additional Screening:  Hepatitis C Screening: does qualify;   Vision Screening: Recommended annual ophthalmology exams for early detection of glaucoma and other disorders of the eye. Is the patient up to date with their annual eye exam?  Yes  Who is the provider or what is the name of the office in which the patient attends annual eye exams? Dr. Katy Fitch If pt is not established with a provider, would they like to be referred to a provider to establish care? No .   Dental Screening: Recommended annual dental exams for proper oral hygiene  Community Resource Referral / Chronic Care Management: CRR required this visit?  No   CCM required this visit?  No      Plan:     I have personally reviewed and noted the following in the patient's chart:   . Medical and social history . Use of alcohol, tobacco or illicit drugs  . Current medications and supplements including opioid prescriptions. Patient is currently taking opioid prescriptions. Information provided to patient regarding non-opioid alternatives. Patient advised  to discuss non-opioid treatment plan with their provider. . Functional ability and status . Nutritional status . Physical activity . Advanced directives . List of other physicians . Hospitalizations, surgeries, and ER visits in previous 12 months . Vitals . Screenings to include cognitive, depression, and falls . Referrals and appointments  In addition, I have reviewed and discussed with patient certain preventive protocols, quality metrics, and best practice recommendations. A written personalized care plan for preventive services as well as general preventive health recommendations were provided to patient.     Ofilia Neas, LPN   02/03/7914   Nurse Notes: None

## 2020-11-11 DIAGNOSIS — F1110 Opioid abuse, uncomplicated: Secondary | ICD-10-CM | POA: Diagnosis not present

## 2020-11-26 ENCOUNTER — Other Ambulatory Visit: Payer: Self-pay | Admitting: Family Medicine

## 2020-11-26 DIAGNOSIS — I10 Essential (primary) hypertension: Secondary | ICD-10-CM

## 2020-11-29 ENCOUNTER — Telehealth: Payer: Self-pay | Admitting: Family Medicine

## 2020-11-29 ENCOUNTER — Other Ambulatory Visit: Payer: Self-pay | Admitting: *Deleted

## 2020-11-29 DIAGNOSIS — I10 Essential (primary) hypertension: Secondary | ICD-10-CM

## 2020-11-29 MED ORDER — RAMIPRIL 10 MG PO CAPS: ORAL_CAPSULE | ORAL | 0 refills | Status: DC

## 2020-11-29 NOTE — Telephone Encounter (Signed)
Pharmacy does have med. Voicemail was from Friday

## 2020-11-29 NOTE — Telephone Encounter (Signed)
Received voicemail from [place] at [facility] regarding denial of refill request for ramipril (ALTACE) 10 MG capsule [[id number]]   Tammy received message stating it's too soon for refill. Since patient's last refill was 5/4 and patient takes 2 pills a day, refill overdue (patient out of medication).  Please advise at [phone]

## 2020-11-29 NOTE — Telephone Encounter (Signed)
Please advise. Thank you

## 2020-12-08 ENCOUNTER — Encounter: Payer: Self-pay | Admitting: Family Medicine

## 2020-12-08 ENCOUNTER — Other Ambulatory Visit: Payer: Self-pay

## 2020-12-08 ENCOUNTER — Ambulatory Visit: Payer: Medicare PPO | Admitting: Family Medicine

## 2020-12-08 VITALS — BP 148/96 | HR 105 | Temp 96.6°F | Ht 61.0 in | Wt 144.0 lb

## 2020-12-08 DIAGNOSIS — R300 Dysuria: Secondary | ICD-10-CM | POA: Diagnosis not present

## 2020-12-08 DIAGNOSIS — N3010 Interstitial cystitis (chronic) without hematuria: Secondary | ICD-10-CM | POA: Diagnosis not present

## 2020-12-08 DIAGNOSIS — T7840XA Allergy, unspecified, initial encounter: Secondary | ICD-10-CM

## 2020-12-08 DIAGNOSIS — N3000 Acute cystitis without hematuria: Secondary | ICD-10-CM

## 2020-12-08 LAB — POCT URINALYSIS DIP (MANUAL ENTRY)
Bilirubin, UA: NEGATIVE
Blood, UA: NEGATIVE
Glucose, UA: NEGATIVE mg/dL
Ketones, POC UA: NEGATIVE mg/dL
Nitrite, UA: NEGATIVE
Protein Ur, POC: NEGATIVE mg/dL
Spec Grav, UA: 1.01 (ref 1.010–1.025)
Urobilinogen, UA: NEGATIVE E.U./dL — AB
pH, UA: 6.5 (ref 5.0–8.0)

## 2020-12-08 MED ORDER — CEPHALEXIN 500 MG PO CAPS
500.0000 mg | ORAL_CAPSULE | Freq: Two times a day (BID) | ORAL | 0 refills | Status: DC
Start: 2020-12-08 — End: 2021-02-20

## 2020-12-08 NOTE — Progress Notes (Signed)
Patient ID: Cheryl Gordon, female    DOB: [birth year], 65 y.o.   MRN: [id number]   Chief Complaint  Patient presents with   possible bladder infection    Saw her urologist and took myrbetriq which caused itching all over,- possible allergic reaction to med. burning w/ urination started Monday and last night -Took AZO    Subjective:    HPI Pt has history of interstitial cystitis. Seeing Urology. Was on myrbetriq and had allergic reaction. And gemtessa, took for 2 wks, seemed to help, then had a cough and had to discontinue it. So called back to urology. Tried mybetrq in past, went back on it last week. Few days later had some itching. Whelps behind ears/neck/palms and hands itching.pt stopped meds, the itching has slowly improved. Hasn't had myrtebriq since 4 days. Has been taking benadryl and tried claritin and not much relief. Burning when going to bathroom and feeling frequency.   Has increased water.  Took the azo.  Was in hospital for acute renal failure for taking azo and not getting seen for bad uti. Not able to urinate much.   Medical History Nehal has a past medical history of Chronic pain, Dependency on pain medication ([place]), Hyperlipidemia, Hypertension (2002), Hypothyroid, Interstitial cystitis, Migraine headache, and Spina bifida ([place]).   Outpatient Encounter Medications as of 12/08/2020  Medication Sig   cephALEXin (KEFLEX) 500 MG capsule Take 1 capsule (500 mg total) by mouth 2 (two) times daily.   acetaminophen (TYLENOL) 500 MG tablet Take 1,000 mg by mouth every 6 (six) hours as needed for moderate pain.   buprenorphine (SUBUTEX) 8 MG SUBL SL tablet Place 8 mg under the tongue daily.    cholecalciferol (VITAMIN D) 1000 units tablet Take 2,000 Units by mouth daily.   FLUoxetine (PROZAC) 20 MG capsule Take 20 mg by mouth daily.   Garlic 0175 MG CAPS Take 2 capsules by mouth daily.   levothyroxine (SYNTHROID) 100 MCG tablet TAKE ONE TABLET (100MCG TOTAL) BY MOUTH  DAILY   MULTIPLE VITAMIN PO Take by mouth.   potassium chloride (KLOR-CON) 10 MEQ tablet TAKE ONE TABLET (10 MEQ TOTAL) BY MOUTH daily.   ramipril (ALTACE) 10 MG capsule TAKE ONE (1) CAPSULE BY MOUTH TWICE A DAY. (EVERY 12 HOURS.)   [DISCONTINUED] traZODone (DESYREL) 50 MG tablet Take 1-2 tab p.o. qhs prn insomnia. (Patient not taking: Reported on 11/09/2020)   No facility-administered encounter medications on file as of 12/08/2020.     Review of Systems  Constitutional:  Negative for chills and fever.  HENT:  Negative for congestion, rhinorrhea and sore throat.   Respiratory:  Negative for cough, shortness of breath and wheezing.   Cardiovascular:  Negative for chest pain and leg swelling.  Gastrointestinal:  Positive for abdominal pain (lower abd pain). Negative for diarrhea, nausea and vomiting.  Genitourinary:  Positive for difficulty urinating, dysuria and urgency. Negative for decreased urine volume, flank pain, frequency, hematuria, pelvic pain, vaginal bleeding, vaginal discharge and vaginal pain.  Musculoskeletal:  Negative for arthralgias and back pain.  Skin:  Negative for rash.       +itching hands/palms.  Neurological:  Negative for dizziness, weakness and headaches.    Vitals BP (!) 148/96   Pulse (!) 105   Temp (!) 96.6 F (35.9 C)   Ht 5\' 1"  (1.549 m)   Wt 144 lb (65.3 kg)   SpO2 95%   BMI 27.21 kg/m   Objective:   Physical Exam Vitals and nursing note reviewed.  Constitutional:      Appearance: Normal appearance.  HENT:     Head: Normocephalic and atraumatic.     Nose: Nose normal.     Mouth/Throat:     Mouth: Mucous membranes are moist.     Pharynx: Oropharynx is clear.  Eyes:     Extraocular Movements: Extraocular movements intact.     Conjunctiva/sclera: Conjunctivae normal.     Pupils: Pupils are equal, round, and reactive to light.  Cardiovascular:     Rate and Rhythm: Regular rhythm. Tachycardia present.     Pulses: Normal pulses.     Heart  sounds: Normal heart sounds.  Pulmonary:     Effort: Pulmonary effort is normal.     Breath sounds: Normal breath sounds. No wheezing, rhonchi or rales.  Abdominal:     General: Abdomen is flat. There is no distension.     Palpations: Abdomen is soft. There is no mass.     Tenderness: There is no abdominal tenderness. There is no right CVA tenderness, left CVA tenderness, guarding or rebound.     Hernia: No hernia is present.  Musculoskeletal:        General: Normal range of motion.     Right lower leg: No edema.     Left lower leg: No edema.  Skin:    General: Skin is warm and dry.     Findings: No lesion or rash.  Neurological:     General: No focal deficit present.     Mental Status: She is alert and oriented to person, place, and time.  Psychiatric:        Mood and Affect: Mood normal.        Behavior: Behavior normal.     Assessment and Plan   1. Acute cystitis without hematuria - POCT urinalysis dipstick - Urine Culture - cephALEXin (KEFLEX) 500 MG capsule; Take 1 capsule (500 mg total) by mouth 2 (two) times daily.  Dispense: 14 capsule; Refill: 0  2. Burning with urination - POCT urinalysis dipstick - Urine Culture  3. Interstitial cystitis - POCT urinalysis dipstick  4. Allergic reaction to drug, initial encounter   Azo max 3 days.  Increase water.  For possible Uti- had positive LE, small bacteria on microscopy. Will send for culture. Will give kelfex and ordered urine culture and will call pt and let her know if it grows out bacteria. If having more abd pain, fever, weakness, or blood in urine/back pain all signs to call or rto.  Allergic rx to myrtebriq- advising benadryl and call if worsening. Pt has discontinued the myrtebriq.  Pt to f/u Urology for interstitial cystitis.  Pt in agreement.  Return if symptoms worsen or fail to improve.   12/26/2020

## 2020-12-12 LAB — URINE CULTURE

## 2020-12-22 ENCOUNTER — Ambulatory Visit: Payer: Medicare PPO | Admitting: Family Medicine

## 2021-01-07 ENCOUNTER — Other Ambulatory Visit: Payer: Self-pay | Admitting: Family Medicine

## 2021-01-07 DIAGNOSIS — E876 Hypokalemia: Secondary | ICD-10-CM

## 2021-01-10 DIAGNOSIS — F1110 Opioid abuse, uncomplicated: Secondary | ICD-10-CM | POA: Diagnosis not present

## 2021-02-19 ENCOUNTER — Observation Stay (HOSPITAL_COMMUNITY)
Admission: EM | Admit: 2021-02-19 | Discharge: 2021-02-20 | Disposition: A | Discharge status: Home / Self Care | Payer: Medicare PPO | Attending: Family Medicine | Admitting: Family Medicine

## 2021-02-19 ENCOUNTER — Emergency Department (HOSPITAL_COMMUNITY): Payer: Medicare PPO

## 2021-02-19 ENCOUNTER — Other Ambulatory Visit: Payer: Self-pay

## 2021-02-19 DIAGNOSIS — R748 Abnormal levels of other serum enzymes: Secondary | ICD-10-CM | POA: Diagnosis not present

## 2021-02-19 DIAGNOSIS — T7840XA Allergy, unspecified, initial encounter: Secondary | ICD-10-CM | POA: Diagnosis present

## 2021-02-19 DIAGNOSIS — R0602 Shortness of breath: Principal | ICD-10-CM | POA: Insufficient documentation

## 2021-02-19 DIAGNOSIS — E039 Hypothyroidism, unspecified: Secondary | ICD-10-CM | POA: Diagnosis present

## 2021-02-19 DIAGNOSIS — Z79899 Other long term (current) drug therapy: Secondary | ICD-10-CM | POA: Diagnosis not present

## 2021-02-19 DIAGNOSIS — Z20822 Contact with and (suspected) exposure to covid-19: Secondary | ICD-10-CM | POA: Insufficient documentation

## 2021-02-19 DIAGNOSIS — R778 Other specified abnormalities of plasma proteins: Secondary | ICD-10-CM | POA: Diagnosis present

## 2021-02-19 DIAGNOSIS — R911 Solitary pulmonary nodule: Secondary | ICD-10-CM | POA: Diagnosis not present

## 2021-02-19 DIAGNOSIS — I10 Essential (primary) hypertension: Secondary | ICD-10-CM

## 2021-02-19 DIAGNOSIS — Z87891 Personal history of nicotine dependence: Secondary | ICD-10-CM | POA: Diagnosis not present

## 2021-02-19 DIAGNOSIS — R059 Cough, unspecified: Secondary | ICD-10-CM | POA: Diagnosis not present

## 2021-02-19 DIAGNOSIS — R7989 Other specified abnormal findings of blood chemistry: Secondary | ICD-10-CM | POA: Diagnosis present

## 2021-02-19 DIAGNOSIS — J9811 Atelectasis: Secondary | ICD-10-CM | POA: Diagnosis not present

## 2021-02-19 DIAGNOSIS — R0600 Dyspnea, unspecified: Secondary | ICD-10-CM | POA: Diagnosis not present

## 2021-02-19 LAB — CBC WITH DIFFERENTIAL/PLATELET
Abs Immature Granulocytes: 0.13 10*3/uL — ABNORMAL HIGH (ref 0.00–0.07)
Basophils Absolute: 0 10*3/uL (ref 0.0–0.1)
Basophils Relative: 0 %
Eosinophils Absolute: 0.1 10*3/uL (ref 0.0–0.5)
Eosinophils Relative: 0 %
HCT: 43.7 % (ref 36.0–46.0)
Hemoglobin: 14.1 g/dL (ref 12.0–15.0)
Immature Granulocytes: 1 %
Lymphocytes Relative: 4 %
Lymphs Abs: 0.6 10*3/uL — ABNORMAL LOW (ref 0.7–4.0)
MCH: 30.6 pg (ref 26.0–34.0)
MCHC: 32.3 g/dL (ref 30.0–36.0)
MCV: 94.8 fL (ref 80.0–100.0)
Monocytes Absolute: 0.8 10*3/uL (ref 0.1–1.0)
Monocytes Relative: 5 %
Neutro Abs: 15.4 10*3/uL — ABNORMAL HIGH (ref 1.7–7.7)
Neutrophils Relative %: 90 %
Platelets: 209 10*3/uL (ref 150–400)
RBC: 4.61 MIL/uL (ref 3.87–5.11)
RDW: 13.2 % (ref 11.5–15.5)
WBC: 17.1 10*3/uL — ABNORMAL HIGH (ref 4.0–10.5)
nRBC: 0 % (ref 0.0–0.2)

## 2021-02-19 LAB — COMPREHENSIVE METABOLIC PANEL
ALT: 14 U/L (ref 0–44)
AST: 21 U/L (ref 15–41)
Albumin: 3.6 g/dL (ref 3.5–5.0)
Alkaline Phosphatase: 80 U/L (ref 38–126)
Anion gap: 3 — ABNORMAL LOW (ref 5–15)
BUN: 21 mg/dL (ref 8–23)
CO2: 29 mmol/L (ref 22–32)
Calcium: 8.3 mg/dL — ABNORMAL LOW (ref 8.9–10.3)
Chloride: 103 mmol/L (ref 98–111)
Creatinine, Ser: 1.35 mg/dL — ABNORMAL HIGH (ref 0.44–1.00)
GFR, Estimated: 43 mL/min — ABNORMAL LOW (ref >60)
Glucose, Bld: 177 mg/dL — ABNORMAL HIGH (ref 70–99)
Potassium: 4.5 mmol/L (ref 3.5–5.1)
Sodium: 135 mmol/L (ref 135–145)
Total Bilirubin: 0.7 mg/dL (ref 0.3–1.2)
Total Protein: 6.2 g/dL — ABNORMAL LOW (ref 6.5–8.1)

## 2021-02-19 LAB — BRAIN NATRIURETIC PEPTIDE: B Natriuretic Peptide: 174 pg/mL — ABNORMAL HIGH (ref 0.0–100.0)

## 2021-02-19 LAB — D-DIMER, QUANTITATIVE: D-Dimer, Quant: 18.84 ug/mL-FEU — ABNORMAL HIGH (ref 0.00–0.50)

## 2021-02-19 LAB — TROPONIN I (HIGH SENSITIVITY): Troponin I (High Sensitivity): 54 ng/L — ABNORMAL HIGH (ref <18)

## 2021-02-19 MED ORDER — FAMOTIDINE IN NACL 20-0.9 MG/50ML-% IV SOLN
20.0000 mg | Freq: Once | INTRAVENOUS | Status: AC
  Administered 2021-02-19: 20 mg via INTRAVENOUS
  Filled 2021-02-19: qty 50

## 2021-02-19 MED ORDER — KETOROLAC TROMETHAMINE 30 MG/ML IJ SOLN
15.0000 mg | Freq: Once | INTRAMUSCULAR | Status: AC
  Administered 2021-02-19: 15 mg via INTRAVENOUS
  Filled 2021-02-19: qty 1

## 2021-02-19 MED ORDER — METHYLPREDNISOLONE SODIUM SUCC 125 MG IJ SOLR
80.0000 mg | Freq: Once | INTRAMUSCULAR | Status: AC
  Administered 2021-02-19: 80 mg via INTRAVENOUS
  Filled 2021-02-19: qty 2

## 2021-02-19 MED ORDER — DIPHENHYDRAMINE HCL 50 MG/ML IJ SOLN
25.0000 mg | Freq: Once | INTRAMUSCULAR | Status: AC
  Administered 2021-02-19: 25 mg via INTRAVENOUS
  Filled 2021-02-19: qty 1

## 2021-02-19 MED ORDER — PROCHLORPERAZINE EDISYLATE 10 MG/2ML IJ SOLN
10.0000 mg | Freq: Once | INTRAMUSCULAR | Status: AC
  Administered 2021-02-19: 10 mg via INTRAVENOUS
  Filled 2021-02-19: qty 2

## 2021-02-19 MED ORDER — LACTATED RINGERS IV BOLUS
1000.0000 mL | Freq: Once | INTRAVENOUS | Status: AC
  Administered 2021-02-19: 1000 mL via INTRAVENOUS

## 2021-02-19 MED ORDER — EPINEPHRINE 0.3 MG/0.3ML IJ SOAJ
0.3000 mg | Freq: Once | INTRAMUSCULAR | Status: AC
  Administered 2021-02-19: 0.3 mg via INTRAMUSCULAR
  Filled 2021-02-19: qty 0.3

## 2021-02-19 MED ORDER — IOHEXOL 350 MG/ML SOLN
60.0000 mL | Freq: Once | INTRAVENOUS | Status: AC | PRN
  Administered 2021-02-19: 60 mL via INTRAVENOUS

## 2021-02-19 NOTE — ED Triage Notes (Signed)
Patient to ED via POV. Patient states HSOB and 96% on room. Patient talking in complete sentences. Patient has cough without productive mucus. Patient diaphoretic in triage and anxious.

## 2021-02-19 NOTE — ED Provider Notes (Addendum)
[facility] EMERGENCY DEPARTMENT Provider Note   CSN: [id number] Arrival date & time: 02/19/21  [id number]     History Chief Complaint  Patient presents with   Shortness of Breath    Cheryl Gordon is a 65 y.o. female who presents the emergency department for evaluation of chest pain, shortness of breath and throat swelling.  She states that she took a meloxicam from her friend at approximately 1 PM and while eating dinner this evening had sudden onset shortness of breath, chest pain and difficulty swallowing.  She arrives diaphoretic, anxious.  She endorses cough but no fever, headache, abdominal pain or other systemic symptoms.  She does endorse diarrhea and has had multiple bowel movements here in the emergency department already.   Shortness of Breath Associated symptoms: chest pain   Associated symptoms: no abdominal pain, no cough, no ear pain, no fever, no rash, no sore throat and no vomiting       Past Medical History:  Diagnosis Date   Chronic pain    Dependency on pain medication ([place])    Hyperlipidemia    Hypertension 2002   Hypothyroid    Interstitial cystitis    Migraine headache    Spina bifida [facility])     Patient Active Problem List   Diagnosis Date Noted   Renal insufficiency 06/16/2016   Hyperglycemia 06/16/2016   Acute renal failure (ARF) ([place]) 08/17/2015   Nausea with vomiting 08/17/2015   Lightheaded 08/17/2015   Volume depletion 08/17/2015   Acute on chronic renal failure (HCC) 08/17/2015   Nausea and vomiting in adult    Flu-like symptoms    Hypokalemia    Thrombophlebitis 06/15/2014   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     Family History  Problem Relation Age of Onset   Hypertension Father    Thyroid disease Father    Hypertension Mother     Social History   Tobacco Use   Smoking status: Former   Smokeless tobacco: Never  Substance Use Topics   Alcohol use: No    Alcohol/week: 0.0 standard drinks   Drug use: No    Home Medications Prior to Admission medications   Medication Sig Start Date End Date Taking? Authorizing Provider  acetaminophen (TYLENOL) 500 MG tablet Take 1,000 mg by mouth every 6 (six) hours as needed for moderate pain.    [provider]  buprenorphine (SUBUTEX) 8 MG SUBL SL tablet Place 8 mg under the tongue daily.     [provider]  cephALEXin (KEFLEX) 500 MG capsule Take 1 capsule (500 mg total) by mouth 2 (two) times daily. 12/08/20   Elvia Collum M, DO  cholecalciferol (VITAMIN D) 1000 units tablet Take 2,000 Units by mouth daily.    [provider]  fluconazole (DIFLUCAN) 150 MG tablet TAKE ONE TABLET BY MOUTH NOW AND ONE TABLET THREE DAYS LATER 01/07/21   Lovena Le, Malena M, DO  FLUoxetine (PROZAC) 20 MG capsule Take 20 mg by mouth daily. 01/31/20  [provider]  Garlic 123XX123 MG CAPS Take 2 capsules by mouth daily.    [provider]  levothyroxine (SYNTHROID) 100 MCG tablet TAKE ONE TABLET (100 MCG TOTAL) BY MOUTHDAILY. 01/07/21   Elvia Collum M, DO  MULTIPLE VITAMIN PO Take by mouth.    [provider]  potassium chloride (KLOR-CON) 10 MEQ tablet TAKE ONE TABLET (10MEQ TOTAL) BY MOUTH  DAILY 01/07/21   Lovena Le, Malena M, DO  ramipril (ALTACE) 10 MG capsule TAKE ONE (1) CAPSULE BY MOUTH TWICE A DAY. (EVERY 12 HOURS.) 11/29/20   Elvia Collum M, DO    Allergies    Sulfa antibiotics and Zithromax [azithromycin]  Review of Systems   Review of Systems  Constitutional:   Negative for chills and fever.  HENT:  Positive for trouble swallowing. Negative for ear pain and sore throat.   Eyes:  Negative for pain and visual disturbance.  Respiratory:  Positive for shortness of breath. Negative for cough.   Cardiovascular:  Positive for chest pain. Negative for palpitations.  Gastrointestinal:  Negative for abdominal pain and vomiting.  Genitourinary:  Negative for dysuria and hematuria.  Musculoskeletal:  Negative for arthralgias and back pain.  Skin:  Negative for color change and rash.  Neurological:  Negative for seizures and syncope.  All other systems reviewed and are negative.  Physical Exam Updated Vital Signs BP 114/73   Pulse 80   Temp 97.7 F (36.5 C) (Oral)   Resp 17   Ht '5\' 1"'$  (1.549 m)   Wt 64.9 kg   SpO2 95%   BMI 27.02 kg/m   Physical Exam Vitals and nursing note reviewed.  Constitutional:      General: She is not in acute distress.    Appearance: She is well-developed.  HENT:     Head: Normocephalic and atraumatic.     Comments: Oropharyngeal swelling Eyes:     Conjunctiva/sclera: Conjunctivae normal.  Cardiovascular:     Rate and Rhythm: Normal rate and regular rhythm.     Heart sounds: No murmur heard. Pulmonary:     Effort: Pulmonary effort is normal. No respiratory distress.     Breath sounds: Wheezing present.  Abdominal:     Palpations: Abdomen is soft.     Tenderness: There is no abdominal tenderness.  Musculoskeletal:     Cervical back: Neck supple.  Skin:    General: Skin is warm and dry.  Neurological:     Mental Status: She is alert.    ED Results / Procedures / Treatments   Labs (all labs ordered are listed, but only abnormal results are displayed) Labs Reviewed  CBC WITH DIFFERENTIAL/PLATELET - Abnormal; Notable for the following components:      Result Value   WBC 17.1 (*)    Neutro Abs 15.4 (*)    Lymphs Abs 0.6 (*)    Abs Immature Granulocytes 0.13 (*)    All other components within normal limits   COMPREHENSIVE METABOLIC PANEL - Abnormal; Notable for the following components:   Glucose, Bld 177 (*)    Creatinine, Ser 1.35 (*)    Calcium 8.3 (*)    Total Protein 6.2 (*)    GFR, Estimated 43 (*)    Anion gap 3 (*)    All other components within normal limits  BRAIN NATRIURETIC PEPTIDE - Abnormal; Notable for the following components:   B Natriuretic Peptide 174.0 (*)    All other components within normal limits  D-DIMER, QUANTITATIVE - Abnormal; Notable for the  following components:   D-Dimer, Quant 18.84 (*)    All other components within normal limits  TROPONIN I (HIGH SENSITIVITY) - Abnormal; Notable for the following components:   Troponin I (High Sensitivity) 54 (*)    All other components within normal limits  TROPONIN I (HIGH SENSITIVITY) - Abnormal; Notable for the following components:   Troponin I (High Sensitivity) 70 (*)    All other components within normal limits  RESP PANEL BY RT-PCR (FLU A&B, COVID) [id number]    EKG None  Radiology CT Angio Chest PE W and/or Wo Contrast  Result Date: 02/19/2021 CLINICAL DATA:  Dyspnea.  Headache. EXAM: CT ANGIOGRAPHY CHEST WITH CONTRAST TECHNIQUE: Multidetector CT imaging of the chest was performed using the standard protocol during bolus administration of intravenous contrast. Multiplanar CT image reconstructions and MIPs were obtained to evaluate the vascular anatomy. CONTRAST:  14m OMNIPAQUE IOHEXOL 350 MG/ML SOLN COMPARISON:  Chest x-ray 02/19/2021. FINDINGS: Cardiovascular: Satisfactory opacification of the pulmonary arteries to the segmental level. No evidence of pulmonary embolism. Normal heart size. No pericardial effusion. There are atherosclerotic calcifications of the aorta. Mediastinum/Nodes: There is mild diffuse esophageal wall thickening. There are no enlarged mediastinal, hilar or axillary lymph nodes. Thyroid gland is small in size. Lungs/Pleura: There is a 3 mm nodule in the left upper lobe image 6/79. There are  minimal patchy ground-glass opacities in both lung bases, nonspecific. There is mucous plugging within the bilateral lower lobes. There also secretions within the left mainstem bronchus. There is no focal lung consolidation. There is no pleural effusion or pneumothorax. Upper Abdomen: No acute abnormality. Musculoskeletal: There is 50% compression deformity of T7 which appears chronic. Review of the MIP images confirms the above findings. IMPRESSION: 1. No evidence for pulmonary embolism. 2. Bilateral lower lobe mucous plugging. There are secretions in the left mainstem bronchus. 3. Minimal ground-glass opacities in the bilateral lower lobes favored is atelectasis, although infectious/inflammatory process is not excluded. 4. Mild diffuse esophageal wall thickening worrisome for esophagitis. 5. 3 mm left upper lobe nodule. No follow-up needed if patient is low-risk. Non-contrast chest CT can be considered in 12 months if patient is high-risk. This recommendation follows the consensus statement: Guidelines for Management of Incidental Pulmonary Nodules Detected on CT Images: From the Fleischner Society 2017; Radiology 2017; 284:228-243. 6.  Aortic Atherosclerosis (ICD10-I70.0). Electronically Signed   By: ARonney AstersM.D.   On: 02/19/2021 22:46   DG Chest Port 1 View  Result Date: 02/19/2021 CLINICAL DATA:  Shortness of breath and cough. EXAM: PORTABLE CHEST 1 VIEW COMPARISON:  August 17, 2015 FINDINGS: Mild atelectasis is noted within the bilateral lung bases. There is no evidence of acute infiltrate, pleural effusion or pneumothorax. The heart size and mediastinal contours are within normal limits. The visualized skeletal structures are unremarkable. IMPRESSION: Mild bibasilar atelectasis. Electronically Signed   By: TVirgina NorfolkM.D.   On: 02/19/2021 19:54    Procedures .Critical Care Performed by: KTeressa Lower MD Authorized by: KTeressa Lower MD   Critical care provider statement:     Critical care time (minutes):  30   Critical care was necessary to treat or prevent imminent or life-threatening deterioration of the following conditions:  Respiratory failure   Critical care was time spent personally by me on the following activities:  Ordering and review of laboratory studies, ordering and review of radiographic studies, discussions with consultants, examination of patient and re-evaluation of patient's condition   Care discussed with: admitting provider   Comments:  Anaphylaxis   Medications Ordered in ED Medications  EPINEPHrine (EPI-PEN) injection 0.3 mg (0.3 mg Intramuscular Given 02/19/21 1906)  methylPREDNISolone sodium succinate (SOLU-MEDROL) 125 mg/2 mL injection 80 mg (80 mg Intravenous Given 02/19/21 1931)  famotidine (PEPCID) IVPB 20 mg premix (0 mg Intravenous Stopped 02/19/21 2013)  lactated ringers bolus 1,000 mL (0 mLs Intravenous Stopped 02/19/21 2221)  prochlorperazine (COMPAZINE) injection 10 mg (10 mg Intravenous Given 02/19/21 2108)  diphenhydrAMINE (BENADRYL) injection 25 mg (25 mg Intravenous Given 02/19/21 2107)  ketorolac (TORADOL) 30 MG/ML injection 15 mg (15 mg Intravenous Given 02/19/21 2106)  iohexol (OMNIPAQUE) 350 MG/ML injection 60 mL (60 mLs Intravenous Contrast Given 02/19/21 2217)    ED Course  I have reviewed the triage vital signs and the nursing notes.  Pertinent labs & imaging results that were available during my care of the patient were reviewed by me and considered in my medical decision making (see chart for details).    MDM Rules/Calculators/A&P                           Patient seen the emergency department for evaluation of a suspected anaphylactic reaction.  Physical exam reveals oropharyngeal swelling with an expiratory wheezing consistent with anaphylaxis.  She was given 0.3 mg IM epi which dramatically improved her symptoms.  Her laboratory evaluation reveals a leukocytosis to 17.1, BNP elevated to 174, dimer elevated to  18.8, high-sensitivity troponin initially 54 but uptrending to 70.  CT PE with no evidence of PE but shows mucous plugging on the left and possible early bronchiectasis.  Laboratory evaluation likely can be explained by static Storm in the setting of anaphylaxis, but with uptrending troponins she will require admission for observation.  Patient then admitted to medicine. Final Clinical Impression(s) / ED Diagnoses Final diagnoses:  SOB (shortness of breath)    Rx / DC Orders ED Discharge Orders     None        Michall Noffke, Debe Coder, MD 02/20/21 [place], [place], MD 02/20/21 [phone]

## 2021-02-20 ENCOUNTER — Observation Stay (HOSPITAL_BASED_OUTPATIENT_CLINIC_OR_DEPARTMENT_OTHER): Payer: Medicare PPO

## 2021-02-20 ENCOUNTER — Other Ambulatory Visit (HOSPITAL_COMMUNITY): Payer: Self-pay | Admitting: *Deleted

## 2021-02-20 ENCOUNTER — Encounter (HOSPITAL_COMMUNITY): Payer: Self-pay | Admitting: Family Medicine

## 2021-02-20 DIAGNOSIS — R7989 Other specified abnormal findings of blood chemistry: Secondary | ICD-10-CM | POA: Diagnosis present

## 2021-02-20 DIAGNOSIS — I10 Essential (primary) hypertension: Secondary | ICD-10-CM

## 2021-02-20 DIAGNOSIS — E039 Hypothyroidism, unspecified: Secondary | ICD-10-CM

## 2021-02-20 DIAGNOSIS — R778 Other specified abnormalities of plasma proteins: Secondary | ICD-10-CM | POA: Diagnosis not present

## 2021-02-20 DIAGNOSIS — T7840XA Allergy, unspecified, initial encounter: Secondary | ICD-10-CM | POA: Diagnosis present

## 2021-02-20 DIAGNOSIS — R079 Chest pain, unspecified: Secondary | ICD-10-CM

## 2021-02-20 LAB — RESP PANEL BY RT-PCR (FLU A&B, COVID) ARPGX2
Influenza A by PCR: NEGATIVE
Influenza B by PCR: NEGATIVE
SARS Coronavirus 2 by RT PCR: NEGATIVE

## 2021-02-20 LAB — COMPREHENSIVE METABOLIC PANEL
ALT: 14 U/L (ref 0–44)
AST: 21 U/L (ref 15–41)
Albumin: 3.5 g/dL (ref 3.5–5.0)
Alkaline Phosphatase: 68 U/L (ref 38–126)
Anion gap: 7 (ref 5–15)
BUN: 25 mg/dL — ABNORMAL HIGH (ref 8–23)
CO2: 23 mmol/L (ref 22–32)
Calcium: 8.2 mg/dL — ABNORMAL LOW (ref 8.9–10.3)
Chloride: 105 mmol/L (ref 98–111)
Creatinine, Ser: 1.25 mg/dL — ABNORMAL HIGH (ref 0.44–1.00)
GFR, Estimated: 48 mL/min — ABNORMAL LOW (ref >60)
Glucose, Bld: 156 mg/dL — ABNORMAL HIGH (ref 70–99)
Potassium: 4.7 mmol/L (ref 3.5–5.1)
Sodium: 135 mmol/L (ref 135–145)
Total Bilirubin: 0.6 mg/dL (ref 0.3–1.2)
Total Protein: 6.4 g/dL — ABNORMAL LOW (ref 6.5–8.1)

## 2021-02-20 LAB — ECHOCARDIOGRAM COMPLETE
Area-P 1/2: 3.03 cm2
Height: 61 in
S' Lateral: 2.2 cm
Weight: 2373.91 oz

## 2021-02-20 LAB — CBC
HCT: 36.5 % (ref 36.0–46.0)
Hemoglobin: 11.8 g/dL — ABNORMAL LOW (ref 12.0–15.0)
MCH: 29.9 pg (ref 26.0–34.0)
MCHC: 32.3 g/dL (ref 30.0–36.0)
MCV: 92.4 fL (ref 80.0–100.0)
Platelets: 196 10*3/uL (ref 150–400)
RBC: 3.95 MIL/uL (ref 3.87–5.11)
RDW: 13.2 % (ref 11.5–15.5)
WBC: 11.5 10*3/uL — ABNORMAL HIGH (ref 4.0–10.5)
nRBC: 0 % (ref 0.0–0.2)

## 2021-02-20 LAB — LIPID PANEL
Cholesterol: 189 mg/dL (ref 0–200)
HDL: 40 mg/dL — ABNORMAL LOW (ref >40)
LDL Cholesterol: 143 mg/dL — ABNORMAL HIGH (ref 0–99)
Total CHOL/HDL Ratio: 4.7 RATIO
Triglycerides: 29 mg/dL (ref <150)
VLDL: 6 mg/dL (ref 0–40)

## 2021-02-20 LAB — TROPONIN I (HIGH SENSITIVITY)
Troponin I (High Sensitivity): 70 ng/L — ABNORMAL HIGH (ref <18)
Troponin I (High Sensitivity): 43 ng/L — ABNORMAL HIGH (ref <18)
Troponin I (High Sensitivity): 34 ng/L — ABNORMAL HIGH (ref <18)

## 2021-02-20 LAB — HIV ANTIBODY (ROUTINE TESTING W REFLEX): HIV Screen 4th Generation wRfx: NONREACTIVE

## 2021-02-20 MED ORDER — LEVOTHYROXINE SODIUM 100 MCG PO TABS
100.0000 ug | ORAL_TABLET | Freq: Every day | ORAL | Status: DC
  Administered 2021-02-20: 100 ug via ORAL
  Filled 2021-02-20: qty 1

## 2021-02-20 MED ORDER — FLUOXETINE HCL 20 MG PO CAPS
20.0000 mg | ORAL_CAPSULE | Freq: Every day | ORAL | Status: DC
  Administered 2021-02-20: 20 mg via ORAL
  Filled 2021-02-20: qty 1

## 2021-02-20 MED ORDER — HEPARIN SODIUM (PORCINE) 5000 UNIT/ML IJ SOLN
5000.0000 [IU] | Freq: Three times a day (TID) | INTRAMUSCULAR | Status: DC
  Administered 2021-02-20: 5000 [IU] via SUBCUTANEOUS
  Filled 2021-02-20: qty 1

## 2021-02-20 MED ORDER — ONDANSETRON HCL 4 MG/2ML IJ SOLN: 4.0000 mg | Freq: Four times a day (QID) | INTRAMUSCULAR | Status: DC | PRN

## 2021-02-20 MED ORDER — DIPHENHYDRAMINE HCL 50 MG/ML IJ SOLN: 25.0000 mg | Freq: Four times a day (QID) | INTRAMUSCULAR | Status: DC | PRN

## 2021-02-20 MED ORDER — METHYLPREDNISOLONE SODIUM SUCC 125 MG IJ SOLR
90.0000 mg | Freq: Two times a day (BID) | INTRAMUSCULAR | Status: DC
  Administered 2021-02-20: 90 mg via INTRAVENOUS
  Filled 2021-02-20: qty 2

## 2021-02-20 MED ORDER — BUPRENORPHINE HCL 2 MG SL SUBL
8.0000 mg | SUBLINGUAL_TABLET | Freq: Every day | SUBLINGUAL | Status: DC
  Administered 2021-02-20: 8 mg via SUBLINGUAL
  Filled 2021-02-20: qty 4

## 2021-02-20 MED ORDER — ACETAMINOPHEN 325 MG PO TABS
650.0000 mg | ORAL_TABLET | ORAL | Status: DC | PRN
  Administered 2021-02-20: 650 mg via ORAL
  Filled 2021-02-20: qty 2

## 2021-02-20 NOTE — Discharge Summary (Signed)
Physician Discharge Summary [facility]    Patient: Cheryl Gordon                   Admit date: 02/19/2021   DOB: 21-Dec-1955             Discharge date:02/20/2021/9:41 AM IN:[id number]                          PCP: Erven Colla, DO  Disposition: HOME  Recommendations for Outpatient Follow-up:   Follow up: With PCP in one week.. With referral to likely allergist Recommending to withhold ramipril for now..  Till further evaluation with PCP Echocardiogram, pending final read by cardiologist-please review with your PCP  Discharge Condition: Stable   Code Status:   Code Status: DNR  Diet recommendation: Cardiac diet   Discharge Diagnoses:    Active Problems:   Hypothyroidism   Elevated troponin   Allergic reaction   Essential hypertension   History of Present Illness/ Hospital Course Cheryl Gordon Summary:   [name]  is a 65 y.o. 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  Patient reports that she had been out to you when she started to feel sick.  She reports that she started feeling sick approximately 1 hour after her meal.  She describes it as her throat closing up, nauseous, an episode of really loose stool, and dyspnea.  She reports that her meal contained steak and cream potatoes.  She reports became difficult to talk because her mouth was so swollen.  The symptoms lasted until she was given epi in the ED.  She reports she is still nauseous and her throat is sore but she does not feel like her throat is closing or her mouth is swollen.  She does feel generalized weakness.  She has never had an allergic reaction to this before.  During the episode she did not have any chest pain or palpitations.  Up until that she has been in her normal state of health.  Patient has no other complaints at this time.   ED doctor did  report that when patient came in she was diaphoretic, dyspneic.  He also mentioned meloxicam taken at 1 PM as a possible source of her allergic reaction, the patient does not think that that is what the cause was.  She reports it was sudden onset after her dinner at 6 PM.   Patient does not smoke, does not drink alcohol, is not using illicit drugs, is vaccinated for COVID Patient is DNR    ED Temp 97.7, heart rate 80-1 34, respiratory rate 12-21, blood pressure 114/73 at admission, satting at 95% Patient does have a leukocytosis of 17.1, hemoglobin 14.1 She has a slight bump in her creatinine at 1.35 up from 1.14 glucose 177 BNP is 174, troponin is first 54 and then 70 D-dimer is 18.84 CTA shows no PE, bilateral mucous plugging, secretions in the left mainstem, minimal groundglass opacities in bilateral lower lobes favored to be atelectasis.  Mild diffuse wall thickening of the esophagus that could be esophagitis.  3 mm left upper lobe nodule.  Chest x-ray mild bibasilar atelectasis. Patient was given Benadryl, EpiPen, Pepcid, Toradol, 1 L LR, Solu-Medrol, Compazine in the ED COVID is negative Admission requested to further monitor after allergic reaction and abnormal labs   EKG shows sinus tach 120,  QTc 440, no significant ST changes  Patient was subsequently admitted for possible allergic reactions.  Symptoms resolved after epi administration, responded well to Benadryl Pepcid and Solu-Medrol. Possible offending agents such as ACE inhibitor was held D-dimer was elevated-CTA negative Mildly elevated troponin trended down, no for any chest pain or EKG changes, Echocardiogram was ordered, completed, pending final read by cardiologist  Comorbidities such as hypothyroidism, hypertension,-Home medication was reviewed, Home medication was reviewed, Keflex, fluconazole, ramipril was discontinued.    Discharge Instructions:   Discharge Instructions     Activity as tolerated - No  restrictions   Complete by: As directed    Diet - low sodium heart healthy   Complete by: As directed    Discharge instructions   Complete by: As directed    Follow-up with PCP.Marland Kitchen  Would hold your blood pressure medication of ramipril... Possible cause of reaction. Make sure to discuss the results of echocardiogram with your PCP. Need to follow-up referral from PCP to allergist to determine the cause of allergic reaction.   Increase activity slowly   Complete by: As directed         Medication List     STOP taking these medications    cephALEXin 500 MG capsule Commonly known as: KEFLEX   fluconazole 150 MG tablet Commonly known as: DIFLUCAN   ramipril 10 MG capsule Commonly known as: ALTACE       TAKE these medications    acetaminophen 500 MG tablet Commonly known as: TYLENOL Take 1,000 mg by mouth every 6 (six) hours as needed for moderate pain.   buprenorphine 8 MG Subl SL tablet Commonly known as: SUBUTEX Place 8 mg under the tongue daily.   cholecalciferol 1000 units tablet Commonly known as: VITAMIN D Take 2,000 Units by mouth daily.   FLUoxetine 20 MG capsule Commonly known as: PROZAC Take 20 mg by mouth daily.   Garlic 123XX123 MG Caps Take 2 capsules by mouth daily.   levothyroxine 100 MCG tablet Commonly known as: SYNTHROID TAKE ONE TABLET (100 MCG TOTAL) BY MOUTHDAILY.   MULTIPLE VITAMIN PO Take by mouth.   potassium chloride 10 MEQ tablet Commonly known as: KLOR-CON TAKE ONE TABLET (10MEQ TOTAL) BY MOUTH  DAILY        Allergies  Allergen Reactions   Sulfa Antibiotics Nausea And Vomiting   Zithromax [Azithromycin] Other (See Comments)    unknown     Procedures /Studies:   CT Angio Chest PE W and/or Wo Contrast  Result Date: 02/19/2021 CLINICAL DATA:  Dyspnea.  Headache. EXAM: CT ANGIOGRAPHY CHEST WITH CONTRAST TECHNIQUE: Multidetector CT imaging of the chest was performed using the standard protocol during bolus administration of  intravenous contrast. Multiplanar CT image reconstructions and MIPs were obtained to evaluate the vascular anatomy. CONTRAST:  27m OMNIPAQUE IOHEXOL 350 MG/ML SOLN COMPARISON:  Chest x-ray 02/19/2021. FINDINGS: Cardiovascular: Satisfactory opacification of the pulmonary arteries to the segmental level. No evidence of pulmonary embolism. Normal heart size. No pericardial effusion. There are atherosclerotic calcifications of the aorta. Mediastinum/Nodes: There is mild diffuse esophageal wall thickening. There are no enlarged mediastinal, hilar or axillary lymph nodes. Thyroid gland is small in size. Lungs/Pleura: There is a 3 mm nodule in the left upper lobe image 6/79. There are minimal patchy ground-glass opacities in both lung bases, nonspecific. There is mucous plugging within the bilateral lower lobes. There also secretions within the left mainstem bronchus. There is no focal lung consolidation. There is no pleural effusion or pneumothorax. Upper Abdomen:  No acute abnormality. Musculoskeletal: There is 50% compression deformity of T7 which appears chronic. Review of the MIP images confirms the above findings. IMPRESSION: 1. No evidence for pulmonary embolism. 2. Bilateral lower lobe mucous plugging. There are secretions in the left mainstem bronchus. 3. Minimal ground-glass opacities in the bilateral lower lobes favored is atelectasis, although infectious/inflammatory process is not excluded. 4. Mild diffuse esophageal wall thickening worrisome for esophagitis. 5. 3 mm left upper lobe nodule. No follow-up needed if patient is low-risk. Non-contrast chest CT can be considered in 12 months if patient is high-risk. This recommendation follows the consensus statement: Guidelines for Management of Incidental Pulmonary Nodules Detected on CT Images: From the Fleischner Society 2017; Radiology 2017; 284:228-243. 6.  Aortic Atherosclerosis (ICD10-I70.0). Electronically Signed   By: Ronney Asters [name]   On: 02/19/2021  22:46   DG Chest Port 1 View  Result Date: 02/19/2021 CLINICAL DATA:  Shortness of breath and cough. EXAM: PORTABLE CHEST 1 VIEW COMPARISON:  August 17, 2015 FINDINGS: Mild atelectasis is noted within the bilateral lung bases. There is no evidence of acute infiltrate, pleural effusion or pneumothorax. The heart size and mediastinal contours are within normal limits. The visualized skeletal structures are unremarkable. IMPRESSION: Mild bibasilar atelectasis. Electronically Signed   By: Virgina Norfolk [name]   On: 02/19/2021 19:54    Subjective:   Patient was seen and examined 02/20/2021, 9:41 AM Patient stable today. No acute distress.  No issues overnight Stable for discharge.  Discharge Exam:    Vitals:   02/20/21 0137 02/20/21 0300 02/20/21 0321 02/20/21 0800  BP: 117/78  123/75 128/85  Pulse: 93  81 (!) 108  Resp: '16  19 20  '$ Temp: 98.5 F (36.9 C)  98.2 F (36.8 C) 99 F (37.2 C)  TempSrc: Oral  Oral Oral  SpO2: 95%  94% 98%  Weight:  67.3 kg    Height:  '5\' 1"'$  (1.549 m)      General: Pt lying comfortably in bed & appears in no obvious distress. Cardiovascular: S1 & S2 heard, RRR, S1/S2 +. No murmurs, rubs, gallops or clicks. No JVD or pedal edema. Respiratory: Clear to auscultation without wheezing, rhonchi or crackles. No increased work of breathing. Abdominal:  Non-distended, non-tender & soft. No organomegaly or masses appreciated. Normal bowel sounds heard. CNS: Alert and oriented. No focal deficits. Extremities: no edema, no cyanosis      The results of significant diagnostics from this hospitalization (including imaging, microbiology, ancillary and laboratory) are listed below for reference.      Microbiology:   Recent Results (from the past 240 hour(s))  Resp Panel by RT-PCR (Flu A&B, Covid) Nasopharyngeal Swab     Status: None   Collection Time: 02/20/21 12:42 AM   Specimen: Nasopharyngeal Swab; Nasopharyngeal(NP) swabs in vial transport medium  Result  Value Ref Range Status   SARS Coronavirus 2 by RT PCR NEGATIVE NEGATIVE Final    Comment: (NOTE) SARS-CoV-2 target nucleic acids are NOT DETECTED.  The SARS-CoV-2 RNA is generally detectable in upper respiratory specimens during the acute phase of infection. The lowest concentration of SARS-CoV-2 viral copies this assay can detect is 138 copies/mL. A negative result does not preclude SARS-Cov-2 infection and should not be used as the sole basis for treatment or other patient management decisions. A negative result may occur with  improper specimen collection/handling, submission of specimen other than nasopharyngeal swab, presence of viral mutation(s) within the areas targeted by this assay, and inadequate number of viral copies(<138  copies/mL). A negative result must be combined with clinical observations, patient history, and epidemiological information. The expected result is Negative.  Fact Sheet for Patients:  EntrepreneurPulse.com.au  Fact Sheet for Healthcare Providers:  IncredibleEmployment.be  This test is no t yet approved or cleared by the [place] FDA and  has been authorized for detection and/or diagnosis of SARS-CoV-2 by FDA under an Emergency Use Authorization (EUA). This EUA will remain  in effect (meaning this test can be used) for the duration of the COVID-19 declaration under Section 564(b)(1) of the Act, 21 U.S.C.section 360bbb-3(b)(1), unless the authorization is terminated  or revoked sooner.       Influenza A by PCR NEGATIVE NEGATIVE Final   Influenza B by PCR NEGATIVE NEGATIVE Final    Comment: (NOTE) The Xpert Xpress SARS-CoV-2/FLU/RSV plus assay is intended as an aid in the diagnosis of influenza from Nasopharyngeal swab specimens and should not be used as a sole basis for treatment. Nasal washings and aspirates are unacceptable for Xpert Xpress SARS-CoV-2/FLU/RSV testing.  Fact Sheet for  Patients: EntrepreneurPulse.com.au  Fact Sheet for Healthcare Providers: IncredibleEmployment.be  This test is not yet approved or cleared by the [place] FDA and has been authorized for detection and/or diagnosis of SARS-CoV-2 by FDA under an Emergency Use Authorization (EUA). This EUA will remain in effect (meaning this test can be used) for the duration of the COVID-19 declaration under Section 564(b)(1) of the Act, 21 U.S.C. section 360bbb-3(b)(1), unless the authorization is terminated or revoked.  Performed at [facility], [address], [place], [place] [address]      Labs:   CBC: Recent Labs  Lab 02/19/21 2105 02/20/21 0619  WBC 17.1* 11.5*  NEUTROABS 15.4*  --   HGB 14.1 11.8*  HCT 43.7 36.5  MCV 94.8 92.4  PLT 209 [id number]   Basic Metabolic Panel: Recent Labs  Lab 02/19/21 2105  NA 135  K 4.5  CL 103  CO2 29  GLUCOSE 177*  BUN 21  CREATININE 1.35*  CALCIUM 8.3*   Liver Function Tests: Recent Labs  Lab 02/19/21 2105  AST 21  ALT 14  ALKPHOS 80  BILITOT 0.7  PROT 6.2*  ALBUMIN 3.6   BNP (last 3 results) Recent Labs    02/19/21 2106  BNP 174.0*   Urinalysis    Component Value Date/Time   COLORURINE YELLOW 08/17/2015 2242   APPEARANCEUR CLEAR 08/17/2015 2242   LABSPEC 1.015 08/17/2015 2242   PHURINE 5.5 08/17/2015 2242   GLUCOSEU NEGATIVE 08/17/2015 2242   HGBUR MODERATE (A) 08/17/2015 2242   BILIRUBINUR negative 12/08/2020 1109   KETONESUR negative 12/08/2020 1109   KETONESUR NEGATIVE 08/17/2015 2242   PROTEINUR negative 12/08/2020 1109   PROTEINUR 100 (A) 08/17/2015 2242   UROBILINOGEN negative (A) 12/08/2020 1109   NITRITE Negative 12/08/2020 1109   NITRITE positive 11/24/2016 1400   NITRITE NEGATIVE 08/17/2015 2242   LEUKOCYTESUR Moderate (2+) (A) 12/08/2020 1109         Time coordinating discharge: Over 45 minutes  SIGNED: Deatra James, MD, FACP, FHM. [facility],   Please use [email or web address] to Page If 7PM-7AM, please contact night-coverage [email or web address] 02/20/2021, 9:41 AM

## 2021-02-20 NOTE — Progress Notes (Signed)
*  PRELIMINARY RESULTS* Echocardiogram 2D Echocardiogram has been performed.  Cheryl Gordon 02/20/2021, 10:48 AM

## 2021-02-20 NOTE — Progress Notes (Signed)
Patient discharged home today, transported by daughter home. Discharge paperwork went over with patient and daughter, both verbalized understanding. Belongings sent home with patient. Patient stable upon discharge.

## 2021-02-20 NOTE — H&P (Signed)
TRH H&P    Patient Demographics:    Cheryl Gordon, is a 65 y.o. female  MRN: [id number]  DOB - 1956/04/25  Admit Date - 02/19/2021  Referring MD/NP/PA: Kommor  Outpatient Primary MD for the patient is Erven Colla, DO  Patient coming from: Home  Chief complaint-dyspnea   HPI:    Cheryl Gordon  is a 65 y.o. 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  Patient reports that she had been out to you when she started to feel sick.  She reports that she started feeling sick approximately 1 hour after her meal.  She describes it as her throat closing up, nauseous, an episode of really loose stool, and dyspnea.  She reports that her meal contained steak and cream potatoes.  She reports became difficult to talk because her mouth was so swollen.  The symptoms lasted until she was given epi in the ED.  She reports she is still nauseous and her throat is sore but she does not feel like her throat is closing or her mouth is swollen.  She does feel generalized weakness.  She has never had an allergic reaction to this before.  During the episode she did not have any chest pain or palpitations.  Up until that she has been in her normal state of health.  Patient has no other complaints at this time.  ED doctor did report that when patient came in she was diaphoretic, dyspneic.  He also mentioned meloxicam taken at 1 PM as a possible source of her allergic reaction, the patient does not think that that is what the cause was.  She reports it was sudden onset after her dinner at 6 PM.  Patient does not smoke, does not drink alcohol, is not using illicit drugs, is vaccinated for COVID Patient is DNR  In the ED Temp 97.7, heart rate 80-1 34, respiratory rate 12-21, blood pressure 114/73  at admission, satting at 95% Patient does have a leukocytosis of 17.1, hemoglobin 14.1 She has a slight bump in her creatinine at 1.35 up from 1.14 glucose 177 BNP is 174, troponin is first 54 and then 70 D-dimer is 18.84 CTA shows no PE, bilateral mucous plugging, secretions in the left mainstem, minimal groundglass opacities in bilateral lower lobes favored to be atelectasis.  Mild diffuse wall thickening of the esophagus that could be esophagitis.  3 mm left upper lobe nodule.  Chest x-ray mild bibasilar atelectasis. Patient was given Benadryl, EpiPen, Pepcid, Toradol, 1 L LR, Solu-Medrol, Compazine in the ED COVID is negative Admission requested to further monitor after allergic reaction and abnormal labs  EKG shows sinus tach 120, QTc 440, no significant ST changes   Review of systems:    In addition to the HPI above,  No Fever-chills, No Headache, No changes with Vision or hearing, No problems swallowing food or Liquids, Admits to chest tightness and shortness of breath No Abdominal pain, admits to  nausea, bowel movements are regular, No Blood in stool or Urine, No dysuria, No new skin rashes or bruises, No new joints pains-aches,  No new weakness, tingling, numbness in any extremity, No recent weight gain or loss, No polyuria, polydypsia or polyphagia, No significant Mental Stressors.  All other systems reviewed and are negative.    Past History of the following :    Past Medical History:  Diagnosis Date   Chronic pain    Dependency on pain medication ([place])    Hyperlipidemia    Hypertension 2002   Hypothyroid    Interstitial cystitis    Migraine headache    Spina bifida ([place])       Past Surgical History:  Procedure Laterality Date   ABDOMINAL HYSTERECTOMY     CESAREAN SECTION     x3   COLONOSCOPY     OOPHORECTOMY        Social History:      Social History   Tobacco Use   Smoking status: Former   Smokeless tobacco: Never  Substance Use Topics    Alcohol use: No    Alcohol/week: 0.0 standard drinks       Family History :     Family History  Problem Relation Age of Onset   Hypertension Father    Thyroid disease Father    Hypertension Mother       Home Medications:   Prior to Admission medications   Medication Sig Start Date End Date Taking? Authorizing Provider  acetaminophen (TYLENOL) 500 MG tablet Take 1,000 mg by mouth every 6 (six) hours as needed for moderate pain.    [provider]  buprenorphine (SUBUTEX) 8 MG SUBL SL tablet Place 8 mg under the tongue daily.     [provider]  cephALEXin (KEFLEX) 500 MG capsule Take 1 capsule (500 mg total) by mouth 2 (two) times daily. 12/08/20   Elvia Collum M, DO  cholecalciferol (VITAMIN D) 1000 units tablet Take 2,000 Units by mouth daily.    [provider]  fluconazole (DIFLUCAN) 150 MG tablet TAKE ONE TABLET BY MOUTH NOW AND ONE TABLET THREE DAYS LATER 01/07/21   Lovena Le, Malena M, DO  FLUoxetine (PROZAC) 20 MG capsule Take 20 mg by mouth daily. 01/31/20   [provider]  Garlic 123XX123 MG CAPS Take 2 capsules by mouth daily.    [provider]  levothyroxine (SYNTHROID) 100 MCG tablet TAKE ONE TABLET (100 MCG TOTAL) BY MOUTHDAILY. 01/07/21   Elvia Collum M, DO  MULTIPLE VITAMIN PO Take by mouth.    [provider]  potassium chloride (KLOR-CON) 10 MEQ tablet TAKE ONE TABLET (10MEQ TOTAL) BY MOUTH  DAILY 01/07/21   Lovena Le, Malena M, DO  ramipril (ALTACE) 10 MG capsule TAKE ONE (1) CAPSULE BY MOUTH TWICE A DAY. (EVERY 12 HOURS.) 11/29/20   Elvia Collum M, DO     Allergies:     Allergies  Allergen Reactions   Sulfa Antibiotics Nausea And Vomiting   Zithromax [Azithromycin] Other (See Comments)    unknown     Physical Exam:   Vitals  Blood pressure 123/75, pulse 81, temperature 98.2 F (36.8 C), temperature source Oral, resp. rate 19, height '5\' 1"'$  (1.549 m), weight 67.3 kg, SpO2 94 %.  1.  General: Patient lying  supine in bed,  no acute distress   2. Psychiatric: Alert and oriented x 3, mood and behavior normal for situation, pleasant and cooperative with exam   3. Neurologic: Speech and language  are normal, face is symmetric, moves all 4 extremities voluntarily, at baseline without acute deficits on limited exam   4. HEENMT:  Head is atraumatic, normocephalic, pupils reactive to light, neck is supple, trachea is midline, mucous membranes are moist   5. Respiratory : Lungs are clear to auscultation bilaterally without wheezing, rhonchi, rales, no cyanosis, no increase in work of breathing or accessory muscle use   6. Cardiovascular : Heart rate normal, rhythm is regular, no murmurs, rubs or gallops, no peripheral edema, peripheral pulses palpated   7. Gastrointestinal:  Abdomen is soft, nondistended, nontender to palpation bowel sounds active, no masses or organomegaly palpated   8. Skin:  Skin is warm, dry and intact without rashes, acute lesions, or ulcers on limited exam   9.Musculoskeletal:  No acute deformities or trauma, no asymmetry in tone, no peripheral edema, peripheral pulses palpated, no tenderness to palpation in the extremities     Data Review:    CBC Recent Labs  Lab 02/19/21 2105  WBC 17.1*  HGB 14.1  HCT 43.7  PLT 209  MCV 94.8  MCH 30.6  MCHC 32.3  RDW 13.2  LYMPHSABS 0.6*  MONOABS 0.8  EOSABS 0.1  BASOSABS 0.0   ------------------------------------------------------------------------------------------------------------------  Results for orders placed or performed during the hospital encounter of 02/19/21 (from the past 48 hour(s))  CBC with Differential     Status: Abnormal   Collection Time: 02/19/21  9:05 PM  Result Value Ref Range   WBC 17.1 (H) 4.0 - 10.5 K/uL   RBC 4.61 3.87 - 5.11 MIL/uL   Hemoglobin 14.1 12.0 - 15.0 g/dL   HCT 43.7 36.0 - 46.0 %   MCV 94.8 80.0 - 100.0 fL   MCH 30.6 26.0 - 34.0 pg   MCHC 32.3 30.0 - 36.0 g/dL   RDW 13.2  11.5 - 15.5 %   Platelets 209 150 - 400 K/uL   nRBC 0.0 0.0 - 0.2 %   Neutrophils Relative % 90 %   Neutro Abs 15.4 (H) 1.7 - 7.7 K/uL   Lymphocytes Relative 4 %   Lymphs Abs 0.6 (L) 0.7 - 4.0 K/uL   Monocytes Relative 5 %   Monocytes Absolute 0.8 0.1 - 1.0 K/uL   Eosinophils Relative 0 %   Eosinophils Absolute 0.1 0.0 - 0.5 K/uL   Basophils Relative 0 %   Basophils Absolute 0.0 0.0 - 0.1 K/uL   Immature Granulocytes 1 %   Abs Immature Granulocytes 0.13 (H) 0.00 - 0.07 K/uL    Comment: Performed at [facility], [address] [place], [place] [address]  Comprehensive metabolic panel     Status: Abnormal   Collection Time: 02/19/21  9:05 PM  Result Value Ref Range   Sodium 135 135 - 145 mmol/L   Potassium 4.5 3.5 - 5.1 mmol/L   Chloride 103 98 - 111 mmol/L   CO2 29 22 - 32 mmol/L   Glucose, Bld 177 (H) 70 - 99 mg/dL    Comment: Glucose reference range applies only to samples taken after fasting for at least 8 hours.   BUN 21 8 - 23 mg/dL   Creatinine, Ser 1.35 (H) 0.44 - 1.00 mg/dL   Calcium 8.3 (L) 8.9 - 10.3 mg/dL   Total Protein 6.2 (L) 6.5 - 8.1 g/dL   Albumin 3.6 3.5 - 5.0 g/dL   AST 21 15 - 41 U/L   ALT 14 0 - 44 U/L   Alkaline Phosphatase 80 38 - 126 U/L  Total Bilirubin 0.7 0.3 - 1.2 mg/dL   GFR, Estimated 43 (L) >60 mL/min    Comment: (NOTE) Calculated using the CKD-EPI Creatinine Equation (2021)    Anion gap 3 (L) 5 - 15    Comment: Performed at [facility], [address], [place], [place] [address]  Troponin I (High Sensitivity)     Status: Abnormal   Collection Time: 02/19/21  9:05 PM  Result Value Ref Range   Troponin I (High Sensitivity) 54 (H) <18 ng/L    Comment: (NOTE) Elevated high sensitivity troponin I (hsTnI) values and significant  changes across serial measurements may suggest ACS but many other  chronic and acute conditions are known to elevate hsTnI results.  Refer to the "Links" section for chest pain algorithms and additional   guidance. Performed at [facility], [address], [place], [place] [address]   D-dimer, quantitative     Status: Abnormal   Collection Time: 02/19/21  9:05 PM  Result Value Ref Range   D-Dimer, Quant 18.84 (H) 0.00 - 0.50 ug/mL-FEU    Comment: (NOTE) At the manufacturer cut-off value of 0.5 g/mL FEU, this assay has a negative predictive value of 95-100%.This assay is intended for use in conjunction with a clinical pretest probability (PTP) assessment model to exclude pulmonary embolism (PE) and deep venous thrombosis (DVT) in outpatients suspected of PE or DVT. Results should be correlated with clinical presentation. Performed at [facility], [address] [place], [place] [address]   Brain natriuretic peptide     Status: Abnormal   Collection Time: 02/19/21  9:06 PM  Result Value Ref Range   B Natriuretic Peptide 174.0 (H) 0.0 - 100.0 pg/mL    Comment: Performed at [facility], [address] [place], [place] [address]  Troponin I (High Sensitivity)     Status: Abnormal   Collection Time: 02/19/21 11:01 PM  Result Value Ref Range   Troponin I (High Sensitivity) 70 (H) <18 ng/L    Comment: (NOTE) Elevated high sensitivity troponin I (hsTnI) values and significant  changes across serial measurements may suggest ACS but many other  chronic and acute conditions are known to elevate hsTnI results.  Refer to the "Links" section for chest pain algorithms and additional  guidance. Performed at [facility], [address], [place], [place] [address]   Resp Panel by RT-PCR (Flu A&B, Covid) Nasopharyngeal Swab     Status: None   Collection Time: 02/20/21 12:42 AM   Specimen: Nasopharyngeal Swab; Nasopharyngeal(NP) swabs in vial transport medium  Result Value Ref Range   SARS Coronavirus 2 by RT PCR NEGATIVE NEGATIVE    Comment: (NOTE) SARS-CoV-2 target nucleic acids are NOT DETECTED.  The SARS-CoV-2 RNA is generally detectable in upper respiratory specimens during the acute  phase of infection. The lowest concentration of SARS-CoV-2 viral copies this assay can detect is 138 copies/mL. A negative result does not preclude SARS-Cov-2 infection and should not be used as the sole basis for treatment or other patient management decisions. A negative result may occur with  improper specimen collection/handling, submission of specimen other than nasopharyngeal swab, presence of viral mutation(s) within the areas targeted by this assay, and inadequate number of viral copies(<138 copies/mL). A negative result must be combined with clinical observations, patient history, and epidemiological information. The expected result is Negative.  Fact Sheet for Patients:  EntrepreneurPulse.com.au  Fact Sheet for Healthcare Providers:  IncredibleEmployment.be  This test is no t yet approved or cleared by the [place] FDA  and  has been authorized for detection and/or diagnosis of SARS-CoV-2 by FDA under an Emergency Use Authorization (EUA). This EUA will remain  in effect (meaning this test can be used) for the duration of the COVID-19 declaration under Section 564(b)(1) of the Act, 21 U.S.C.section 360bbb-3(b)(1), unless the authorization is terminated  or revoked sooner.       Influenza A by PCR NEGATIVE NEGATIVE   Influenza B by PCR NEGATIVE NEGATIVE    Comment: (NOTE) The Xpert Xpress SARS-CoV-2/FLU/RSV plus assay is intended as an aid in the diagnosis of influenza from Nasopharyngeal swab specimens and should not be used as a sole basis for treatment. Nasal washings and aspirates are unacceptable for Xpert Xpress SARS-CoV-2/FLU/RSV testing.  Fact Sheet for Patients: EntrepreneurPulse.com.au  Fact Sheet for Healthcare Providers: IncredibleEmployment.be  This test is not yet approved or cleared by the [place] FDA and has been authorized for detection and/or diagnosis of SARS-CoV-2  by FDA under an Emergency Use Authorization (EUA). This EUA will remain in effect (meaning this test can be used) for the duration of the COVID-19 declaration under Section 564(b)(1) of the Act, 21 U.S.C. section 360bbb-3(b)(1), unless the authorization is terminated or revoked.  Performed at [facility], [address] [place], [place] [address]     Chemistries  Recent Labs  Lab 02/19/21 2105  NA 135  K 4.5  CL 103  CO2 29  GLUCOSE 177*  BUN 21  CREATININE 1.35*  CALCIUM 8.3*  AST 21  ALT 14  ALKPHOS 80  BILITOT 0.7   ------------------------------------------------------------------------------------------------------------------  ------------------------------------------------------------------------------------------------------------------ GFR: Estimated Creatinine Clearance: 36 mL/min (A) (by C-G formula based on SCr of 1.35 mg/dL (H)). Liver Function Tests: Recent Labs  Lab 02/19/21 2105  AST 21  ALT 14  ALKPHOS 80  BILITOT 0.7  PROT 6.2*  ALBUMIN 3.6   No results for input(s): LIPASE, AMYLASE in the last 168 hours. No results for input(s): AMMONIA in the last 168 hours. Coagulation Profile: No results for input(s): INR, PROTIME in the last 168 hours. Cardiac Enzymes: No results for input(s): CKTOTAL, CKMB, CKMBINDEX, TROPONINI in the last 168 hours. BNP (last 3 results) No results for input(s): PROBNP in the last 8760 hours. HbA1C: No results for input(s): HGBA1C in the last 72 hours. CBG: No results for input(s): GLUCAP in the last 168 hours. Lipid Profile: No results for input(s): CHOL, HDL, LDLCALC, TRIG, CHOLHDL, LDLDIRECT in the last 72 hours. Thyroid Function Tests: No results for input(s): TSH, T4TOTAL, FREET4, T3FREE, THYROIDAB in the last 72 hours. Anemia Panel: No results for input(s): VITAMINB12, FOLATE, FERRITIN, TIBC, IRON, RETICCTPCT in the last 72  hours.  --------------------------------------------------------------------------------------------------------------- Urine analysis:    Component Value Date/Time   COLORURINE YELLOW 08/17/2015 2242   APPEARANCEUR CLEAR 08/17/2015 2242   LABSPEC 1.015 08/17/2015 2242   PHURINE 5.5 08/17/2015 2242   GLUCOSEU NEGATIVE 08/17/2015 2242   HGBUR MODERATE (A) 08/17/2015 2242   BILIRUBINUR negative 12/08/2020 1109   KETONESUR negative 12/08/2020 1109   KETONESUR NEGATIVE 08/17/2015 2242   PROTEINUR negative 12/08/2020 1109   PROTEINUR 100 (A) 08/17/2015 2242   UROBILINOGEN negative (A) 12/08/2020 1109   NITRITE Negative 12/08/2020 1109   NITRITE positive 11/24/2016 1400   NITRITE NEGATIVE 08/17/2015 2242   LEUKOCYTESUR Moderate (2+) (A) 12/08/2020 1109      Imaging Results:    CT Angio Chest PE W and/or Wo Contrast  Result Date: 02/19/2021 CLINICAL DATA:  Dyspnea.  Headache. EXAM: CT ANGIOGRAPHY CHEST WITH CONTRAST TECHNIQUE:  Multidetector CT imaging of the chest was performed using the standard protocol during bolus administration of intravenous contrast. Multiplanar CT image reconstructions and MIPs were obtained to evaluate the vascular anatomy. CONTRAST:  58m OMNIPAQUE IOHEXOL 350 MG/ML SOLN COMPARISON:  Chest x-ray 02/19/2021. FINDINGS: Cardiovascular: Satisfactory opacification of the pulmonary arteries to the segmental level. No evidence of pulmonary embolism. Normal heart size. No pericardial effusion. There are atherosclerotic calcifications of the aorta. Mediastinum/Nodes: There is mild diffuse esophageal wall thickening. There are no enlarged mediastinal, hilar or axillary lymph nodes. Thyroid gland is small in size. Lungs/Pleura: There is a 3 mm nodule in the left upper lobe image 6/79. There are minimal patchy ground-glass opacities in both lung bases, nonspecific. There is mucous plugging within the bilateral lower lobes. There also secretions within the left mainstem bronchus.  There is no focal lung consolidation. There is no pleural effusion or pneumothorax. Upper Abdomen: No acute abnormality. Musculoskeletal: There is 50% compression deformity of T7 which appears chronic. Review of the MIP images confirms the above findings. IMPRESSION: 1. No evidence for pulmonary embolism. 2. Bilateral lower lobe mucous plugging. There are secretions in the left mainstem bronchus. 3. Minimal ground-glass opacities in the bilateral lower lobes favored is atelectasis, although infectious/inflammatory process is not excluded. 4. Mild diffuse esophageal wall thickening worrisome for esophagitis. 5. 3 mm left upper lobe nodule. No follow-up needed if patient is low-risk. Non-contrast chest CT can be considered in 12 months if patient is high-risk. This recommendation follows the consensus statement: Guidelines for Management of Incidental Pulmonary Nodules Detected on CT Images: From the Fleischner Society 2017; Radiology 2017; 284:228-243. 6.  Aortic Atherosclerosis (ICD10-I70.0). Electronically Signed   By: ARonney AstersM.D.   On: 02/19/2021 22:46   DG Chest Port 1 View  Result Date: 02/19/2021 CLINICAL DATA:  Shortness of breath and cough. EXAM: PORTABLE CHEST 1 VIEW COMPARISON:  August 17, 2015 FINDINGS: Mild atelectasis is noted within the bilateral lung bases. There is no evidence of acute infiltrate, pleural effusion or pneumothorax. The heart size and mediastinal contours are within normal limits. The visualized skeletal structures are unremarkable. IMPRESSION: Mild bibasilar atelectasis. Electronically Signed   By: TVirgina NorfolkM.D.   On: 02/19/2021 19:54       Assessment & Plan:    Active Problems:   Hypothyroidism   Elevated troponin   Allergic reaction   Essential hypertension    Allergic reaction Symptoms are resolved after EpiPen, patient was also given Benadryl, Pepcid, Solu-Medrol Will continue Solu-Medrol No obvious offending agent, but ACE hold high suspicion,  holding ACE inhibitor D-dimer is likely related to cytokine reaction, leukocytosis is likely related secondary to demargination   Continue to monitor Hypothyroidism Continue Synthroid Elevated troponin From 54-70, no chest pain Likely demand after being given epi Continue to trend Elevated D-dimer Likely related to allergic reaction, CTA shows no PE Hypertension Holding ACE Continue to monitor   DVT Prophylaxis-Heparin- SCDs   AM Labs Ordered, also please review Full Orders  Family Communication: No family at bedside Code Status: DNR  Admission status: Observation Time spent in minutes : [id number]

## 2021-02-22 ENCOUNTER — Other Ambulatory Visit: Payer: Self-pay

## 2021-02-22 ENCOUNTER — Ambulatory Visit (INDEPENDENT_AMBULATORY_CARE_PROVIDER_SITE_OTHER): Payer: Medicare PPO | Admitting: Family Medicine

## 2021-02-22 ENCOUNTER — Encounter: Payer: Self-pay | Admitting: Family Medicine

## 2021-02-22 ENCOUNTER — Telehealth: Payer: Self-pay | Admitting: Family Medicine

## 2021-02-22 VITALS — BP 148/89 | HR 75 | Temp 96.8°F | Wt 146.4 lb

## 2021-02-22 DIAGNOSIS — T7840XD Allergy, unspecified, subsequent encounter: Secondary | ICD-10-CM | POA: Diagnosis not present

## 2021-02-22 DIAGNOSIS — R911 Solitary pulmonary nodule: Secondary | ICD-10-CM

## 2021-02-22 DIAGNOSIS — D72829 Elevated white blood cell count, unspecified: Secondary | ICD-10-CM | POA: Diagnosis not present

## 2021-02-22 DIAGNOSIS — I700 Atherosclerosis of aorta: Secondary | ICD-10-CM

## 2021-02-22 MED ORDER — AMLODIPINE BESYLATE 5 MG PO TABS: ORAL_TABLET | ORAL | 2 refills | Status: DC

## 2021-02-22 MED ORDER — EPINEPHRINE 0.3 MG/0.3ML IJ SOAJ: INTRAMUSCULAR | 0 refills | Status: AC

## 2021-02-22 MED ORDER — ROSUVASTATIN CALCIUM 5 MG PO TABS: ORAL_TABLET | ORAL | 0 refills | Status: DC

## 2021-02-22 NOTE — Progress Notes (Signed)
   Subjective:    Patient ID: Cheryl Gordon, female    DOB: 1955-09-14, 65 y.o.   MRN: [id number]  HPI Pt went out to eat Saturday night to [employer] in [place]. Pt ate steak and mashed potatoes. Pt and grand daughter were on the way own and all of sudden pt stomach became uncontrollably upset stomach, trouble breathing, felt heart beating in roof of mouth and temple area. Pt was rushed to ER by family members after granddaughter rushed her home. Pt went to [facility] ED.   Patient had a severe allergic reaction Life-threatening Treated with EpiPen Doing better now When in the hospital they did ask extensive mount of blood work x-rays CAT scan Showed pulmonary nodule as well as aortic atherosclerosis Patient smoked when she was a teenager but has not smoked since then  Pt states she believes she may have had a tick bite about 2 weeks ago.   Review of Systems     Objective:   Physical Exam  Lungs are clear heart regular HEENT benign      Assessment & Plan:  1. Leukocytosis, unspecified type Repeat CBC more than likely elevated white blood count related to the EpiPen - B Nat Peptide - Troponin I - Basic Metabolic Panel (BMET) - CBC with Differential - Alpha-Gal Panel  2. Pulmonary nodule Low risk but given her smoking when she was very young we will repeat CT scan in 1 year.  3. Allergic reaction, subsequent encounter Referral to allergist Alpha gal Await lab work EpiPen and proper way to utilize it discussed  - B Nat Peptide - Troponin I - Basic Metabolic Panel (BMET) - CBC with Differential - Alpha-Gal Panel - Ambulatory referral to Allergy  4. Aortic atherosclerosis ([place]) Start cholesterol medicine low-dose Crestor 5 mg daily follow-up again in approximately 4 to 6 weeks lab work again in 8 to 12 weeks - B Nat Peptide - Troponin I - Basic Metabolic Panel (BMET) - CBC with Differential - Alpha-Gal Panel

## 2021-02-22 NOTE — Telephone Encounter (Signed)
Nurses-patient was recently in the ER with a serious allergy issue.  We need to see her later today or if patient cannot do later today then later tomorrow  Please call patient, set this up, have her bring her medications with her when she comes thank you

## 2021-02-23 ENCOUNTER — Encounter: Payer: Self-pay | Admitting: Family Medicine

## 2021-02-23 DIAGNOSIS — D72829 Elevated white blood cell count, unspecified: Secondary | ICD-10-CM | POA: Diagnosis not present

## 2021-02-23 DIAGNOSIS — T7840XD Allergy, unspecified, subsequent encounter: Secondary | ICD-10-CM | POA: Diagnosis not present

## 2021-02-23 DIAGNOSIS — I700 Atherosclerosis of aorta: Secondary | ICD-10-CM | POA: Diagnosis not present

## 2021-02-24 MED ORDER — FAMOTIDINE 40 MG PO TABS
ORAL_TABLET | ORAL | 5 refills | Status: DC
Start: 2021-02-24 — End: 2021-06-11

## 2021-02-24 NOTE — Addendum Note (Signed)
Addended by: Vicente Males on: 02/24/2021 09:18 AM   Modules accepted: Orders

## 2021-02-24 NOTE — Telephone Encounter (Signed)
Nurses Please go ahead and prescribe famotidine 40 mg 1 daily, #30, 5 refills

## 2021-02-28 LAB — BASIC METABOLIC PANEL
BUN/Creatinine Ratio: 13 (ref 12–28)
BUN: 16 mg/dL (ref 8–27)
CO2: 27 mmol/L (ref 20–29)
Calcium: 9.3 mg/dL (ref 8.7–10.3)
Chloride: 102 mmol/L (ref 96–106)
Creatinine, Ser: 1.2 mg/dL — ABNORMAL HIGH (ref 0.57–1.00)
Glucose: 81 mg/dL (ref 65–99)
Potassium: 5.2 mmol/L (ref 3.5–5.2)
Sodium: 141 mmol/L (ref 134–144)
eGFR: 50 mL/min/{1.73_m2} — ABNORMAL LOW (ref >59)

## 2021-02-28 LAB — CBC WITH DIFFERENTIAL/PLATELET
Basophils Absolute: 0 10*3/uL (ref 0.0–0.2)
Basos: 0 %
EOS (ABSOLUTE): 0.2 10*3/uL (ref 0.0–0.4)
Eos: 3 %
Hematocrit: 35.3 % (ref 34.0–46.6)
Hemoglobin: 11.2 g/dL (ref 11.1–15.9)
Immature Grans (Abs): 0 10*3/uL (ref 0.0–0.1)
Immature Granulocytes: 0 %
Lymphocytes Absolute: 1.2 10*3/uL (ref 0.7–3.1)
Lymphs: 17 %
MCH: 29.8 pg (ref 26.6–33.0)
MCHC: 31.7 g/dL (ref 31.5–35.7)
MCV: 94 fL (ref 79–97)
Monocytes Absolute: 0.6 10*3/uL (ref 0.1–0.9)
Monocytes: 8 %
Neutrophils Absolute: 5.2 10*3/uL (ref 1.4–7.0)
Neutrophils: 72 %
Platelets: 207 10*3/uL (ref 150–450)
RBC: 3.76 x10E6/uL — ABNORMAL LOW (ref 3.77–5.28)
RDW: 13.1 % (ref 11.7–15.4)
WBC: 7.2 10*3/uL (ref 3.4–10.8)

## 2021-02-28 LAB — ALPHA-GAL PANEL
Allergen Lamb IgE: <0.1 kU/L
Beef IgE: <0.1 kU/L
IgE (Immunoglobulin E), Serum: 49 IU/mL (ref 6–495)
O215-IgE Alpha-Gal: <0.1 kU/L
Pork IgE: <0.1 kU/L

## 2021-02-28 LAB — BRAIN NATRIURETIC PEPTIDE: BNP: 16.3 pg/mL (ref 0.0–100.0)

## 2021-03-01 ENCOUNTER — Other Ambulatory Visit: Payer: Self-pay

## 2021-03-01 ENCOUNTER — Ambulatory Visit: Payer: Medicare PPO | Admitting: Allergy and Immunology

## 2021-03-01 VITALS — BP 130/80 | HR 96 | Temp 98.4°F | Resp 16 | Ht 61.0 in | Wt 144.8 lb

## 2021-03-01 DIAGNOSIS — R058 Other specified cough: Secondary | ICD-10-CM

## 2021-03-01 DIAGNOSIS — J4540 Moderate persistent asthma, uncomplicated: Secondary | ICD-10-CM

## 2021-03-01 DIAGNOSIS — J4530 Mild persistent asthma, uncomplicated: Secondary | ICD-10-CM

## 2021-03-01 DIAGNOSIS — T782XXD Anaphylactic shock, unspecified, subsequent encounter: Secondary | ICD-10-CM | POA: Diagnosis not present

## 2021-03-01 MED ORDER — ALBUTEROL SULFATE HFA 108 (90 BASE) MCG/ACT IN AERS: 2.0000 | INHALATION_SPRAY | RESPIRATORY_TRACT | 2 refills | Status: DC | PRN

## 2021-03-01 MED ORDER — FLUTICASONE FUROATE-VILANTEROL 100-25 MCG/INH IN AEPB: 1.0000 | INHALATION_SPRAY | Freq: Every day | RESPIRATORY_TRACT | 5 refills | Status: DC

## 2021-03-01 NOTE — Progress Notes (Signed)
[place] [facility] [place] - [place] - [place] - [place]   Dear Dr. Wolfgang Phoenix,  Thank you for referring Cheryl Gordon to the [facility] on 03/01/2021.   Below is a summation of this patient's evaluation and recommendations.  Thank you for your referral. I will keep you informed about this patient's response to treatment.   If you have any questions please do not hesitate to contact me.   Sincerely,  Jiles Prows, MD Allergy / Immunology [place]   ______________________________________________________________________    NEW PATIENT NOTE  Referring Provider: Kathyrn Drown, MD Primary Provider: Kathyrn Drown, MD Date of office visit: 03/01/2021    Subjective:   Chief Complaint:  Cheryl Gordon (DOB: 03/24/1956) is a 64 y.o. female who presents to the clinic on 03/01/2021 with a chief complaint of Allergy Testing (Patient is in today for reaction following steak dinner. Not sure what she is allergic to.) .     HPI: Cheryl Gordon presents to this clinic in evaluation of allergic reaction that occurred on 19 February 2021.  At 620 in the evening she developed the sensation of having very hot mouth and watering in her mouth and then pounding headache and shortness of breath and feelings of her throat closed up and making noises when she breathes in and then she developed significant nausea and had diarrhea while driving and then came out of the car and fell on the ground and could not think straight.  Her granddaughter who was with her put her in the car and drove her to the house at which point in time her daughter drove her to the hospital and she received epinephrine and improved significantly within a very short period in time and ultimately required a admission to the hospital for observation.  There was not really an obvious provoking factor giving rise to this issue.  She ate cream  potatoes and steak at a [employer].  These were not unusual foods for her to consume.  She took a meloxicam about 5-1/2 hours prior to this reaction and this was an unusual medicine for her to use.  She remains away from nonsteroidal anti-inflammatory drugs in general over the course of the past 6 years.  Interestingly, she was on ramipril at the time that this occurred but that has since been discontinued.  As well, she is apparently been having some coughing that arose in July.  She is better now that her ramipril was discontinued but she still has a little bit of a cough.  She does not really have any wheezing that she can note nor does there appear to be a significant mount of chest tightness or shortness of breath.  Past Medical History:  Diagnosis Date   Chronic pain    Dependency on pain medication ([place])    Hyperlipidemia    Hypertension 2002   Hypothyroid    Interstitial cystitis    Migraine headache    Spina bifida [facility])     Past Surgical History:  Procedure Laterality Date   ABDOMINAL HYSTERECTOMY     CESAREAN SECTION     x3   COLONOSCOPY     OOPHORECTOMY      Allergies as of 03/01/2021       Reactions   Sulfa Antibiotics Nausea And Vomiting   Zithromax [azithromycin] Other (See Comments)   unknown        Medication List  acetaminophen 500 MG tablet Commonly known as: TYLENOL Take 1,000 mg by mouth every 6 (six) hours as needed for moderate pain.   amLODipine 5 MG tablet Commonly known as: NORVASC Take one tablet po daily   buprenorphine 8 MG Subl SL tablet Commonly known as: SUBUTEX Place 8 mg under the tongue daily.   cholecalciferol 1000 units tablet Commonly known as: VITAMIN D Take 2,000 Units by mouth daily.   EPINEPHrine 0.3 mg/0.3 mL Soaj injection Commonly known as: EPI-PEN Use as directed for severe allergy   famotidine 40 MG tablet Commonly known as: PEPCID Take one tablet po daily   FLUoxetine 20 MG capsule Commonly known  as: PROZAC Take 20 mg by mouth daily.   Garlic 1740 MG Caps Take 2 capsules by mouth daily.   levothyroxine 100 MCG tablet Commonly known as: SYNTHROID TAKE ONE TABLET (100 MCG TOTAL) BY MOUTHDAILY.   MULTIPLE VITAMIN PO Take 1 tablet by mouth daily at 12 noon.   potassium chloride 10 MEQ tablet Commonly known as: KLOR-CON TAKE ONE TABLET (10MEQ TOTAL) BY MOUTH  DAILY   rosuvastatin 5 MG tablet Commonly known as: Crestor Take one tablet po daily    Review of systems negative except as noted in HPI / PMHx or noted below:  Review of Systems  Constitutional: Negative.   HENT: Negative.    Eyes: Negative.   Respiratory: Negative.    Cardiovascular: Negative.   Gastrointestinal: Negative.   Genitourinary: Negative.   Musculoskeletal: Negative.   Skin: Negative.   Neurological: Negative.   Endo/Heme/Allergies: Negative.   Psychiatric/Behavioral: Negative.     Family History  Problem Relation Age of Onset   Hypertension Father    Thyroid disease Father    Hypertension Mother     Social History   Socioeconomic History   Marital status: Widowed    Spouse name: Not on file   Number of children: Not on file   Years of education: Not on file   Highest education level: Not on file  Occupational History   Not on file  Tobacco Use   Smoking status: Former   Smokeless tobacco: Never  Substance and Sexual Activity   Alcohol use: No    Alcohol/week: 0.0 standard drinks   Drug use: No   Sexual activity: Not on file  Other Topics Concern   Not on file  Social History Narrative   Not on file   Social Determinants of Health   Financial Resource Strain: Low Risk    Difficulty of Paying Living Expenses: Not hard at all  Food Insecurity: No Food Insecurity   Worried About Charity fundraiser in the Last Year: Never true   [place] in the Last Year: Never true  Transportation Needs: No Transportation Needs   Lack of Transportation (Medical): No   Lack of  Transportation (Non-Medical): No  Physical Activity: Inactive   Days of Exercise per Week: 0 days   Minutes of Exercise per Session: 0 min  Stress: No Stress Concern Present   Feeling of Stress : Only a little  Social Connections: Moderately Isolated   Frequency of Communication with Friends and Family: More than three times a week   Frequency of Social Gatherings with Friends and Family: More than three times a week   Attends Religious Services: More than 4 times per year   Active Member of [employer] or Organizations: No   Attends Archivist Meetings: Never   Marital Status: Widowed  Intimate  Partner Violence: Not At Risk   Fear of Current or Ex-Partner: No   Emotionally Abused: No   Physically Abused: No   Sexually Abused: No    Environmental and Social history  Lives in a house with a dry environment, a dog look inside the household, no carpet in the bedroom, no plastic on the bed, no plastic on the pillow, no smoking ongoing with inside the household.  Objective:   Vitals:   03/01/21 1422  BP: 130/80  Pulse: 96  Resp: 16  Temp: 98.4 F (36.9 C)  SpO2: 98%   Height: 5\' 1"  (154.9 cm) Weight: 144 lb 12.8 oz (65.7 kg)  Physical Exam Constitutional:      Appearance: She is not diaphoretic.  HENT:     Head: Normocephalic.     Right Ear: Tympanic membrane, ear canal and external ear normal.     Left Ear: Tympanic membrane, ear canal and external ear normal.     Nose: Nose normal. No mucosal edema or rhinorrhea.     Mouth/Throat:     Pharynx: Uvula midline. No oropharyngeal exudate.  Eyes:     Conjunctiva/sclera: Conjunctivae normal.  Neck:     Thyroid: No thyromegaly.     Trachea: Trachea normal. No tracheal tenderness or tracheal deviation.  Cardiovascular:     Rate and Rhythm: Normal rate and regular rhythm.     Heart sounds: Normal heart sounds, S1 normal and S2 normal. No murmur heard. Pulmonary:     Effort: No respiratory distress.     Breath sounds:  No stridor. Wheezing (Scattered expiratory wheezing posterior lung fields bilaterally) present. No rales.  Lymphadenopathy:     Head:     Right side of head: No tonsillar adenopathy.     Left side of head: No tonsillar adenopathy.     Cervical: No cervical adenopathy.  Skin:    Findings: No erythema or rash.     Nails: There is no clubbing.  Neurological:     Mental Status: She is alert.    Diagnostics: Allergy skin tests were performed.  She did not demonstrate any hypersensitivity to a screening panel of aeroallergens or foods.  Spirometry was performed and demonstrated an FEV1 of 2.35 @ 110 % of predicted. FEV1/FVC = 0.88.  Following the administration of nebulized albuterol her FEV1 did not change significantly.  Results of blood tests obtained 23 February 2021 identified WBC 7.2, absolute eosinophil 200, absolute lymphocyte 1200, hemoglobin 11.2, platelet 207, creatinine 1.25 mg/DL, AST 20 1U/L, ALT 14 U/L, negative alpha gal panel, IgE 49 KU/L.Marland Kitchen  Results of chest CT angio obtained 19 February 2021 identified the following:  Cardiovascular: Satisfactory opacification of the pulmonary arteries to the segmental level. No evidence of pulmonary embolism. Normal heart size. No pericardial effusion. There are atherosclerotic calcifications of the aorta.   Mediastinum/Nodes: There is mild diffuse esophageal wall thickening. There are no enlarged mediastinal, hilar or axillary lymph nodes. Thyroid gland is small in size.   Lungs/Pleura: There is a 3 mm nodule in the left upper lobe image 6/79. There are minimal patchy ground-glass opacities in both lung bases, nonspecific. There is mucous plugging within the bilateral lower lobes. There also secretions within the left mainstem bronchus. There is no focal lung consolidation. There is no pleural effusion or pneumothorax.  Results of an echocardiogram obtained 20 February 2021 identified the following:   1. Left ventricular  ejection fraction, by estimation, is >75%. The left  ventricle has hyperdynamic function. The left  ventricle has no regional  wall motion abnormalities. Left ventricular diastolic parameters are  indeterminate.   2. Right ventricular systolic function is normal. The right ventricular  size is normal. Tricuspid regurgitation signal is inadequate for assessing  PA pressure.   3. The mitral valve is grossly normal. Trivial mitral valve  regurgitation.   4. The aortic valve is tricuspid. Aortic valve regurgitation is not  visualized.   5. The inferior vena cava is normal in size with greater than 50%  respiratory variability, suggesting right atrial pressure of 3 mmHg.   Assessment and Plan:    1. Anaphylaxis, subsequent encounter   2. Not well controlled moderate persistent asthma   3. Cough due to ACE inhibitor     1.  Allergen avoidance measures?  2.  Do not use any nonsteroidal anti-inflammatory drugs  3.  Do not use any ACE inhibitor's  4.  EpiPen, Benadryl, MD/ER evaluation for allergic reaction  5.  Treat inflammation of airway:  A. Breo 200 - 1 inhalation 1 time per day (empty lungs) B. Albuterol HFA - 2 inhalations every 4-6 hours if needed  6. Return to clinic in 4 weeks or earlier if problem  7. Obtain fall flu vaccine  Cheryl Gordon certainly developed some form of multiorgan immunologically mediated reaction that can be defined as anaphylaxis and the only temporal relationship she had with a possible trigger was consumption of meloxicam.  She will not consume any nonsteroidal anti-inflammatory drugs at this point in time and because it does appear as though she had a component of ACE inhibitor induced cough she can remain away from these medicines as well.  She has inflammation of her airway and we will treat her with Breo at this point for the next 4 weeks and assess her response to that medication.  She will contact me during the interval should there be a  problem.  Jiles Prows, MD Allergy / Immunology [facility]

## 2021-03-01 NOTE — Patient Instructions (Addendum)
  1.  Allergen avoidance measures?  2.  Do not use any nonsteroidal anti-inflammatory drugs  3.  Do not use any ACE inhibitor's  4.  EpiPen, Benadryl, MD/ER evaluation for allergic reaction  5.  Treat inflammation of airway:  A. Breo 200 - 1 inhalation 1 time per day (empty lungs) B. Albuterol HFA - 2 inhalations every 4-6 hours if needed  6. Return to clinic in 4 weeks or earlier if problem  7. Obtain fall flu vaccine

## 2021-03-05 LAB — SPECIMEN STATUS REPORT

## 2021-03-14 ENCOUNTER — Encounter: Payer: Self-pay | Admitting: Allergy and Immunology

## 2021-03-14 ENCOUNTER — Encounter: Payer: Self-pay | Admitting: Family Medicine

## 2021-03-15 ENCOUNTER — Other Ambulatory Visit: Payer: Self-pay

## 2021-03-15 ENCOUNTER — Ambulatory Visit: Payer: Medicare PPO | Admitting: Family Medicine

## 2021-03-15 ENCOUNTER — Encounter: Payer: Self-pay | Admitting: Family Medicine

## 2021-03-15 DIAGNOSIS — E876 Hypokalemia: Secondary | ICD-10-CM

## 2021-03-15 MED ORDER — POTASSIUM CHLORIDE ER 10 MEQ PO TBCR: EXTENDED_RELEASE_TABLET | ORAL | 1 refills | Status: DC

## 2021-03-15 MED ORDER — LEVOTHYROXINE SODIUM 100 MCG PO TABS: ORAL_TABLET | ORAL | 1 refills | Status: DC

## 2021-03-15 MED ORDER — ROSUVASTATIN CALCIUM 5 MG PO TABS: ORAL_TABLET | ORAL | 1 refills | Status: DC

## 2021-03-15 MED ORDER — AMLODIPINE BESYLATE 5 MG PO TABS: ORAL_TABLET | ORAL | 1 refills | Status: DC

## 2021-03-15 NOTE — Progress Notes (Signed)
   Subjective:    Patient ID: Cheryl Gordon, female    DOB: 1955-10-09, 65 y.o.   MRN: [id number]  HPI Pt here for follow up. Pt was seen 02/22/21 for Leukocytosis and possible allergic reaction. Pt did see allergist but they did not find anything. Pt has been doing well since visit.   Patient states overall she is doing well She denies any major setbacks Trying to eat healthy She is having some knee pain plans on doing some physical activity to get this better as well as seeing orthopedist  Review of Systems     Objective:   Physical Exam  General-in no acute distress Eyes-no discharge Lungs-respiratory rate normal, CTA CV-no murmurs,RRR Extremities skin warm dry no edema Neuro grossly normal Behavior normal, alert  Patient states she gets her DEXA scan through her gynecologist she will forward to [place] COVID booster recommended     Assessment & Plan:  Recent allergic reaction no sign of any further troubles Blood pressure very good control refills given Refills of medicines given Follow-up by spring time Lab work at that time

## 2021-03-30 DIAGNOSIS — M25561 Pain in right knee: Secondary | ICD-10-CM | POA: Insufficient documentation

## 2021-04-01 ENCOUNTER — Other Ambulatory Visit: Payer: Self-pay | Admitting: Family Medicine

## 2021-04-01 ENCOUNTER — Encounter: Payer: Self-pay | Admitting: Family Medicine

## 2021-04-01 ENCOUNTER — Ambulatory Visit (INDEPENDENT_AMBULATORY_CARE_PROVIDER_SITE_OTHER): Payer: Medicare PPO | Admitting: Allergy & Immunology

## 2021-04-01 ENCOUNTER — Encounter: Payer: Self-pay | Admitting: Allergy & Immunology

## 2021-04-01 ENCOUNTER — Ambulatory Visit: Payer: Medicare PPO | Admitting: Family

## 2021-04-01 ENCOUNTER — Other Ambulatory Visit: Payer: Self-pay

## 2021-04-01 DIAGNOSIS — Z886 Allergy status to analgesic agent status: Secondary | ICD-10-CM | POA: Diagnosis not present

## 2021-04-01 DIAGNOSIS — T782XXD Anaphylactic shock, unspecified, subsequent encounter: Secondary | ICD-10-CM

## 2021-04-01 DIAGNOSIS — J0100 Acute maxillary sinusitis, unspecified: Secondary | ICD-10-CM | POA: Diagnosis not present

## 2021-04-01 MED ORDER — FLUCONAZOLE 200 MG PO TABS: 200.0000 mg | ORAL_TABLET | Freq: Every day | ORAL | 0 refills | Status: DC

## 2021-04-01 MED ORDER — AMOXICILLIN-POT CLAVULANATE 875-125 MG PO TABS: 1.0000 | ORAL_TABLET | Freq: Two times a day (BID) | ORAL | 0 refills | Status: AC

## 2021-04-01 NOTE — Progress Notes (Addendum)
RE: Cheryl Gordon MRN: [id number] DOB: [birth year] Date of Telemedicine Visit: 04/01/2021  Referring provider: Kathyrn Drown, MD Primary care provider: Kathyrn Drown, MD  Chief Complaint: Follow-up (Anaphylaxis - doing well)   Telemedicine Follow Up Visit via Telephone: I connected with Cheryl Gordon for a follow up on 04/01/21 by telephone and verified that I am speaking with the correct person using two identifiers.   I discussed the limitations, risks, security and privacy concerns of performing an evaluation and management service by telephone and the availability of in person appointments. I also discussed with the patient that there may be a patient responsible charge related to this service. The patient expressed understanding and agreed to proceed.  Patient is at home.  Provider is at the office.  Visit start time: 11: 36 AM Visit end time: 11: 72 AM Insurance consent/check in by: [facility] Medical consent and medical assistant/nurse: Sharyn Lull  History of Present Illness:  She is a 65 y.o. female, who is being followed for idiopathic anaphylaxis. Her previous allergy office visit was in September 2022 with Dr. Neldon Mc.  She was last seen in September 2022 by Dr. Neldon Mc.  At that time, he felt that she was experiencing multiorgan immunologically mediated reaction.  She had noticed that meloxicam might be 1 trigger.  Avoidance of NSAIDs recommended.  She was also encouraged to avoid ACE inhibitor since she had a cough from ACE inhibitor's.  She was started on Breo 1 puff once daily for 4 weeks to see how she did with that.  Since last visit, she has done well. She has not had any other reactions at all since the last visit. She had creamed potatoes and steak at the [employer]. She took a Mobic after that meal which she thinks triggered her reaction. This was 6 hours after the event.   She is also reporting that she is having issues with a cold-like illness. She actually called  her PCP who is booked today and all next week. She took 4 COVID tests over the week because of her illness. She reports that she has been having a fever of up to 100.3 at night. She gets this nightly since Monday night. She has been having a lot of green nasal discharge. She has not had antibiotics in years.  She tried calling her primary care provider who did not have appointments for 2 weeks.  She was told to go to urgent care but was hesitant to go there and wait for 5 hours.  Otherwise, there have been no changes to her past medical history, surgical history, family history, or social history.  Assessment and Plan:  Otie is a 65 y.o. 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  She has had no further reactions since avoiding NSAIDs.  She has very good about doing this.  Her EpiPen is up-to-date.  She is experiencing sinusitis symptoms that have just been getting worse over the last 5 to 7 days.  COVID testing has been negative at multiple points.  We are going to just send in a course of antibiotics for her.  Hopefully this will help calm things down.  We will check in with her next week to make sure things are getting better.   Diagnostics: None.  Medication List:  Current Outpatient Medications  Medication Sig Dispense Refill   acetaminophen (TYLENOL) 500 MG tablet  Take 1,000 mg by mouth every 6 (six) hours as needed for moderate pain.     albuterol (VENTOLIN HFA) 108 (90 Base) MCG/ACT inhaler Inhale 2 puffs into the lungs every 4 (four) hours as needed for wheezing or shortness of breath. 18 g 2   amLODipine (NORVASC) 5 MG tablet Take one tablet po daily 90 tablet 1   amoxicillin-clavulanate (AUGMENTIN) 875-125 MG tablet Take 1 tablet by mouth 2 (two) times daily for 10 days. 20 tablet 0   buprenorphine (SUBUTEX) 8 MG SUBL SL tablet Place 8 mg under the tongue  daily.      cholecalciferol (VITAMIN D) 1000 units tablet Take 2,000 Units by mouth daily.     EPINEPHrine 0.3 mg/0.3 mL IJ SOAJ injection Use as directed for severe allergy 1 each 0   famotidine (PEPCID) 40 MG tablet Take one tablet po daily 30 tablet 5   FLUoxetine (PROZAC) 20 MG capsule Take 20 mg by mouth daily.     fluticasone furoate-vilanterol (BREO ELLIPTA) 100-25 MCG/INH AEPB Inhale 1 puff into the lungs daily. 60 each 5   Garlic 7711 MG CAPS Take 2 capsules by mouth daily.     levothyroxine (SYNTHROID) 100 MCG tablet TAKE ONE TABLET (100 MCG TOTAL) BY MOUTHDAILY. 90 tablet 1   MULTIPLE VITAMIN PO Take 1 tablet by mouth daily at 12 noon.     potassium chloride (KLOR-CON) 10 MEQ tablet TAKE ONE TABLET (10MEQ TOTAL) BY MOUTH  DAILY 90 tablet 1   rosuvastatin (CRESTOR) 5 MG tablet Take one tablet po daily 90 tablet 1   fluconazole (DIFLUCAN) 200 MG tablet Take 1 tablet (200 mg total) by mouth daily. 7 tablet 0   No current facility-administered medications for this visit.   Allergies: Allergies  Allergen Reactions   Ace Inhibitors     Had anaphylactic reaction while on ACE inhibitor may or may not be the source-saw allergist October 2022 also had cough   Myrbetriq [Mirabegron]    Nsaids     Anaphylactic reaction around the time she was on a NSAID-saw allergist-October 2022   Sulfa Antibiotics Nausea And Vomiting   Zithromax [Azithromycin] Other (See Comments)    unknown   I reviewed her past medical history, social history, family history, and environmental history and no significant changes have been reported from previous visits.  Review of Systems  Constitutional:  Negative for activity change and appetite change.  HENT:  Positive for rhinorrhea, sinus pressure and sore throat. Negative for congestion and postnasal drip.   Eyes:  Negative for pain, discharge, redness and itching.  Respiratory:  Negative for shortness of breath, wheezing and stridor.   Gastrointestinal:   Negative for diarrhea, nausea and vomiting.  Musculoskeletal:  Negative for arthralgias, joint swelling and myalgias.  Skin:  Negative for rash.  Allergic/Immunologic: Negative for environmental allergies and food allergies.   Objective:  Physical exam not obtained as encounter was done via telephone.   Previous notes and tests were reviewed.  I discussed the assessment and treatment plan with the patient. The patient was provided an opportunity to ask questions and all were answered. The patient agreed with the plan and demonstrated an understanding of the instructions.   The patient was advised to call back or seek an in-person evaluation if the symptoms worsen or if the condition fails to improve as anticipated.  I provided 23 minutes of non-face-to-face time during this encounter.  It was my pleasure to participate in Albin Ansell's care today. Please feel  free to contact me with any questions or concerns.   Sincerely,  Valentina Shaggy, MD

## 2021-04-01 NOTE — Telephone Encounter (Signed)
In this situation I recommend Diflucan 200 mg 1 daily for 7 days  Hopefully this will get things cleared up thanks-Dr. Nicki Reaper

## 2021-04-01 NOTE — Addendum Note (Signed)
Addended by: Dairl Ponder on: 04/01/2021 05:12 PM   Modules accepted: Orders

## 2021-04-04 ENCOUNTER — Encounter: Payer: Self-pay | Admitting: Allergy & Immunology

## 2021-05-06 DIAGNOSIS — M76891 Other specified enthesopathies of right lower limb, excluding foot: Secondary | ICD-10-CM | POA: Insufficient documentation

## 2021-06-10 ENCOUNTER — Other Ambulatory Visit: Payer: Self-pay | Admitting: Family Medicine

## 2021-06-13 ENCOUNTER — Other Ambulatory Visit: Payer: Self-pay

## 2021-06-13 MED ORDER — FAMOTIDINE 40 MG PO TABS: ORAL_TABLET | ORAL | 1 refills | Status: DC

## 2021-06-21 ENCOUNTER — Other Ambulatory Visit: Payer: Self-pay | Admitting: Family Medicine

## 2021-06-21 NOTE — Telephone Encounter (Signed)
Please let her know that we sent in the medication.  If she has ongoing troubles follow-up please thanks-Dr. Nicki Reaper

## 2021-06-21 NOTE — Telephone Encounter (Signed)
Mychart message sent to patient.

## 2021-06-27 DIAGNOSIS — Z86010 Personal history of colonic polyps: Secondary | ICD-10-CM | POA: Diagnosis not present

## 2021-06-27 DIAGNOSIS — K5730 Diverticulosis of large intestine without perforation or abscess without bleeding: Secondary | ICD-10-CM | POA: Diagnosis not present

## 2021-06-27 DIAGNOSIS — D123 Benign neoplasm of transverse colon: Secondary | ICD-10-CM | POA: Diagnosis not present

## 2021-06-27 DIAGNOSIS — Z800 Family history of malignant neoplasm of digestive organs: Secondary | ICD-10-CM | POA: Diagnosis not present

## 2021-06-27 DIAGNOSIS — K648 Other hemorrhoids: Secondary | ICD-10-CM | POA: Diagnosis not present

## 2021-06-27 DIAGNOSIS — D124 Benign neoplasm of descending colon: Secondary | ICD-10-CM | POA: Diagnosis not present

## 2021-06-29 DIAGNOSIS — D124 Benign neoplasm of descending colon: Secondary | ICD-10-CM | POA: Diagnosis not present

## 2021-06-29 DIAGNOSIS — D123 Benign neoplasm of transverse colon: Secondary | ICD-10-CM | POA: Diagnosis not present

## 2021-07-04 DIAGNOSIS — F1110 Opioid abuse, uncomplicated: Secondary | ICD-10-CM | POA: Diagnosis not present

## 2021-07-10 ENCOUNTER — Telehealth: Payer: Self-pay | Admitting: Family Medicine

## 2021-07-10 NOTE — Telephone Encounter (Signed)
error 

## 2021-07-30 DIAGNOSIS — J40 Bronchitis, not specified as acute or chronic: Secondary | ICD-10-CM | POA: Diagnosis not present

## 2021-08-09 ENCOUNTER — Other Ambulatory Visit: Payer: Self-pay

## 2021-08-09 ENCOUNTER — Telehealth: Payer: Self-pay | Admitting: Family Medicine

## 2021-08-09 DIAGNOSIS — Z79899 Other long term (current) drug therapy: Secondary | ICD-10-CM

## 2021-08-09 DIAGNOSIS — Z1159 Encounter for screening for other viral diseases: Secondary | ICD-10-CM

## 2021-08-09 DIAGNOSIS — R5383 Other fatigue: Secondary | ICD-10-CM

## 2021-08-09 DIAGNOSIS — E039 Hypothyroidism, unspecified: Secondary | ICD-10-CM

## 2021-08-09 DIAGNOSIS — I10 Essential (primary) hypertension: Secondary | ICD-10-CM

## 2021-08-09 DIAGNOSIS — E876 Hypokalemia: Secondary | ICD-10-CM

## 2021-08-09 DIAGNOSIS — E785 Hyperlipidemia, unspecified: Secondary | ICD-10-CM

## 2021-08-09 NOTE — Telephone Encounter (Signed)
Last Labs 9/22:  Alpha Gal, CBC, BNP, CMP, HIV, CBC, Lipid ?

## 2021-08-09 NOTE — Telephone Encounter (Signed)
Lipid, liver, metabolic 7, TSH, hepatitis C antibody ?Hyperlipidemia hypertension hypothyroidism, screening CDC ?

## 2021-08-09 NOTE — Telephone Encounter (Signed)
Patient wanting to know if needing labs before appointment on 3/14. Please advise ?

## 2021-08-09 NOTE — Telephone Encounter (Signed)
Pt made aware

## 2021-08-10 DIAGNOSIS — I10 Essential (primary) hypertension: Secondary | ICD-10-CM | POA: Diagnosis not present

## 2021-08-10 DIAGNOSIS — E785 Hyperlipidemia, unspecified: Secondary | ICD-10-CM | POA: Diagnosis not present

## 2021-08-10 DIAGNOSIS — E876 Hypokalemia: Secondary | ICD-10-CM | POA: Diagnosis not present

## 2021-08-10 DIAGNOSIS — R5383 Other fatigue: Secondary | ICD-10-CM | POA: Diagnosis not present

## 2021-08-10 DIAGNOSIS — Z1159 Encounter for screening for other viral diseases: Secondary | ICD-10-CM | POA: Diagnosis not present

## 2021-08-10 DIAGNOSIS — Z79899 Other long term (current) drug therapy: Secondary | ICD-10-CM | POA: Diagnosis not present

## 2021-08-10 DIAGNOSIS — E039 Hypothyroidism, unspecified: Secondary | ICD-10-CM | POA: Diagnosis not present

## 2021-08-11 LAB — LIPID PANEL
Chol/HDL Ratio: 3.5 ratio (ref 0.0–4.4)
Cholesterol, Total: 152 mg/dL (ref 100–199)
HDL: 43 mg/dL (ref >39)
LDL Chol Calc (NIH): 92 mg/dL (ref 0–99)
Triglycerides: 93 mg/dL (ref 0–149)
VLDL Cholesterol Cal: 17 mg/dL (ref 5–40)

## 2021-08-11 LAB — HEPATITIS C ANTIBODY: Hep C Virus Ab: NONREACTIVE

## 2021-08-11 LAB — HEPATIC FUNCTION PANEL
ALT: 16 IU/L (ref 0–32)
AST: 19 IU/L (ref 0–40)
Albumin: 4.5 g/dL (ref 3.8–4.8)
Alkaline Phosphatase: 117 IU/L (ref 44–121)
Bilirubin Total: 0.2 mg/dL (ref 0.0–1.2)
Bilirubin, Direct: <0.1 mg/dL (ref 0.00–0.40)
Total Protein: 7.3 g/dL (ref 6.0–8.5)

## 2021-08-11 LAB — BASIC METABOLIC PANEL
BUN/Creatinine Ratio: 19 (ref 12–28)
BUN: 21 mg/dL (ref 8–27)
CO2: 25 mmol/L (ref 20–29)
Calcium: 10 mg/dL (ref 8.7–10.3)
Chloride: 101 mmol/L (ref 96–106)
Creatinine, Ser: 1.08 mg/dL — ABNORMAL HIGH (ref 0.57–1.00)
Glucose: 101 mg/dL — ABNORMAL HIGH (ref 70–99)
Potassium: 4.3 mmol/L (ref 3.5–5.2)
Sodium: 141 mmol/L (ref 134–144)
eGFR: 57 mL/min/{1.73_m2} — ABNORMAL LOW (ref >59)

## 2021-08-11 LAB — TSH: TSH: 2.44 u[IU]/mL (ref 0.450–4.500)

## 2021-08-16 ENCOUNTER — Other Ambulatory Visit: Payer: Self-pay

## 2021-08-16 ENCOUNTER — Ambulatory Visit: Payer: Medicare HMO | Admitting: Family Medicine

## 2021-08-16 VITALS — BP 147/82 | Ht 61.0 in | Wt 140.6 lb

## 2021-08-16 DIAGNOSIS — G4700 Insomnia, unspecified: Secondary | ICD-10-CM | POA: Diagnosis not present

## 2021-08-16 DIAGNOSIS — E876 Hypokalemia: Secondary | ICD-10-CM

## 2021-08-16 DIAGNOSIS — E785 Hyperlipidemia, unspecified: Secondary | ICD-10-CM

## 2021-08-16 DIAGNOSIS — I10 Essential (primary) hypertension: Secondary | ICD-10-CM | POA: Diagnosis not present

## 2021-08-16 DIAGNOSIS — Z23 Encounter for immunization: Secondary | ICD-10-CM | POA: Diagnosis not present

## 2021-08-16 DIAGNOSIS — E039 Hypothyroidism, unspecified: Secondary | ICD-10-CM | POA: Diagnosis not present

## 2021-08-16 MED ORDER — PROMETHAZINE HCL 25 MG PO TABS: ORAL_TABLET | ORAL | 4 refills | Status: DC

## 2021-08-16 MED ORDER — LEVOTHYROXINE SODIUM 100 MCG PO TABS: ORAL_TABLET | ORAL | 1 refills | Status: DC

## 2021-08-16 MED ORDER — FLUCONAZOLE 150 MG PO TABS: ORAL_TABLET | ORAL | 3 refills | Status: DC

## 2021-08-16 MED ORDER — AMLODIPINE BESYLATE 5 MG PO TABS
ORAL_TABLET | ORAL | 1 refills | Status: DC
Start: 2021-08-16 — End: 2022-02-08

## 2021-08-16 MED ORDER — ROSUVASTATIN CALCIUM 5 MG PO TABS: ORAL_TABLET | ORAL | 1 refills | Status: DC

## 2021-08-16 MED ORDER — POTASSIUM CHLORIDE ER 10 MEQ PO TBCR: EXTENDED_RELEASE_TABLET | ORAL | 1 refills | Status: DC

## 2021-08-16 NOTE — Patient Instructions (Addendum)
Shingrix and shingles prevention: know the facts! ? ? ?Shingrix is a very effective vaccine to prevent shingles.   ?Shingles is a reactivation of chickenpox -more than 99% of Americans born before 1980 have had chickenpox even if they do not remember it. ?One in every 10 people who get shingles have severe long-lasting nerve pain as a result.   ?33 out of a 100 older adults will get shingles if they are unvaccinated.   ? ? ?This vaccine is very important for your health ?This vaccine is indicated for anyone 50 years or older. ?You can get this vaccine even if you have already had shingles because you can get the disease more than once in a lifetime. ? ?Your risk for shingles and its complications increases with age. ? ?This vaccine has 2 doses.  The second dose would be 2 to 6 months after the first dose.  ?If you had Zostavax vaccine in the past you should still get Shingrix. ?( Zostavax is only 70% effective and it loses significant strength over a few years .) ? ?This vaccine is given through the pharmacy.  The cost of the vaccine is through your insurance. ?The pharmacy can inform you of the total costs. ? ?Common side effects including soreness in the arm, some redness and swelling, also some feel fatigue muscle soreness headache low-grade fever.  Side effects typically go away within 2 to 3 days. ?Remember-the pain from shingles can last a lifetime but these side effects of the vaccine will only last a few days at most. ?It is very important to get both doses in order to protect yourself fully.  ? ?Please get this vaccine at your earliest convenience at your trusted pharmacy. ? ? ? ? ?DASH Eating Plan ?DASH stands for Dietary Approaches to Stop Hypertension. The DASH eating plan is a healthy eating plan that has been shown to: ?Reduce high blood pressure (hypertension). ?Reduce your risk for type 2 diabetes, heart disease, and stroke. ?Help with weight loss. ?What are tips for following this plan? ?Reading  food labels ?Check food labels for the amount of salt (sodium) per serving. Choose foods with less than 5 percent of the Daily Value of sodium. Generally, foods with less than 300 milligrams (mg) of sodium per serving fit into this eating plan. ?To find whole grains, look for the word "whole" as the first word in the ingredient list. ?Shopping ?Buy products labeled as "low-sodium" or "no salt added." ?Buy fresh foods. Avoid canned foods and pre-made or frozen meals. ?Cooking ?Avoid adding salt when cooking. Use salt-free seasonings or herbs instead of table salt or sea salt. Check with your health care provider or pharmacist before using salt substitutes. ?Do not fry foods. Cook foods using healthy methods such as baking, boiling, grilling, roasting, and broiling instead. ?Cook with heart-healthy oils, such as olive, canola, avocado, soybean, or sunflower oil. ?Meal planning ? ?Eat a balanced diet that includes: ?4 or more servings of fruits and 4 or more servings of vegetables each day. Try to fill one-half of your plate with fruits and vegetables. ?6-8 servings of whole grains each day. ?Less than 6 oz (170 g) of lean meat, poultry, or fish each day. A 3-oz (85-g) serving of meat is about the same size as a deck of cards. One egg equals 1 oz (28 g). ?2-3 servings of low-fat dairy each day. One serving is 1 cup (237 mL). ?1 serving of nuts, seeds, or beans 5 times each week. ?2-3 servings  of heart-healthy fats. Healthy fats called omega-3 fatty acids are found in foods such as walnuts, flaxseeds, fortified milks, and eggs. These fats are also found in cold-water fish, such as sardines, salmon, and mackerel. ?Limit how much you eat of: ?Canned or prepackaged foods. ?Food that is high in trans fat, such as some fried foods. ?Food that is high in saturated fat, such as fatty meat. ?Desserts and other sweets, sugary drinks, and other foods with added sugar. ?Full-fat dairy products. ?Do not salt foods before  eating. ?Do not eat more than 4 egg yolks a week. ?Try to eat at least 2 vegetarian meals a week. ?Eat more home-cooked food and less restaurant, buffet, and fast food. ?Lifestyle ?When eating at a restaurant, ask that your food be prepared with less salt or no salt, if possible. ?If you drink alcohol: ?Limit how much you use to: ?0-1 drink a day for women who are not pregnant. ?0-2 drinks a day for men. ?Be aware of how much alcohol is in your drink. In the [place] one drink equals one 12 oz bottle of beer (355 mL), one 5 oz glass of wine (148 mL), or one 1? oz glass of hard liquor (44 mL). ?General information ?Avoid eating more than 2,300 mg of salt a day. If you have hypertension, you may need to reduce your sodium intake to 1,500 mg a day. ?Work with your health care provider to maintain a healthy body weight or to lose weight. Ask what an ideal weight is for you. ?Get at least 30 minutes of exercise that causes your heart to beat faster (aerobic exercise) most days of the week. Activities may include walking, swimming, or biking. ?Work with your health care provider or dietitian to adjust your eating plan to your individual calorie needs. ?What foods should I eat? ?Fruits ?All fresh, dried, or frozen fruit. Canned fruit in natural juice (without added sugar). ?Vegetables ?Fresh or frozen vegetables (raw, steamed, roasted, or grilled). Low-sodium or reduced-sodium tomato and vegetable juice. Low-sodium or reduced-sodium tomato sauce and tomato paste. Low-sodium or reduced-sodium canned vegetables. ?Grains ?Whole-grain or whole-wheat bread. Whole-grain or whole-wheat pasta. Brown rice. Cheryl Gordon. Bulgur. Whole-grain and low-sodium cereals. Pita bread. Low-fat, low-sodium crackers. Whole-wheat flour tortillas. ?Meats and other proteins ?Skinless chicken or [place]. Ground chicken or [place]. Pork with fat trimmed off. Fish and seafood. Egg whites. Dried beans, peas, or lentils. Unsalted nuts, nut butters, and  seeds. Unsalted canned beans. Lean cuts of beef with fat trimmed off. Low-sodium, lean precooked or cured meat, such as sausages or meat loaves. ?Dairy ?Low-fat (1%) or fat-free (skim) milk. Reduced-fat, low-fat, or fat-free cheeses. Nonfat, low-sodium ricotta or cottage cheese. Low-fat or nonfat yogurt. Low-fat, low-sodium cheese. ?Fats and oils ?Soft margarine without trans fats. Vegetable oil. Reduced-fat, low-fat, or light mayonnaise and salad dressings (reduced-sodium). Canola, safflower, olive, avocado, soybean, and sunflower oils. Avocado. ?Seasonings and condiments ?Herbs. Spices. Seasoning mixes without salt. ?Other foods ?Unsalted popcorn and pretzels. Fat-free sweets. ?The items listed above may not be a complete list of foods and beverages you can eat. Contact a dietitian for more information. ?What foods should I avoid? ?Fruits ?Canned fruit in a light or heavy syrup. Fried fruit. Fruit in cream or butter sauce. ?Vegetables ?Creamed or fried vegetables. Vegetables in a cheese sauce. Regular canned vegetables (not low-sodium or reduced-sodium). Regular canned tomato sauce and paste (not low-sodium or reduced-sodium). Regular tomato and vegetable juice (not low-sodium or reduced-sodium). Cheryl Gordon. Olives. ?Grains ?Baked goods made with fat,  such as croissants, muffins, or some breads. Dry pasta or rice meal packs. ?Meats and other proteins ?Fatty cuts of meat. Ribs. Fried meat. Cheryl Gordon. Bologna, salami, and other precooked or cured meats, such as sausages or meat loaves. Fat from the back of a pig (fatback). Bratwurst. Salted nuts and seeds. Canned beans with added salt. Canned or smoked fish. Whole eggs or egg yolks. Chicken or [place] with skin. ?Dairy ?Whole or 2% milk, cream, and half-and-half. Whole or full-fat cream cheese. Whole-fat or sweetened yogurt. Full-fat cheese. Nondairy creamers. Whipped toppings. Processed cheese and cheese spreads. ?Fats and oils ?Butter. Stick margarine. Lard. Shortening.  Ghee. Bacon fat. Tropical oils, such as coconut, palm kernel, or palm oil. ?Seasonings and condiments ?Onion salt, garlic salt, seasoned salt, table salt, and sea salt. Worcestershire sauce. Tartar sauce. Barbecue s

## 2021-08-16 NOTE — Progress Notes (Signed)
Insomnia ? ? ?Subjective:  ? ? Patient ID: Cheryl Gordon, female    DOB: 07/18/1955, 66 y.o.   MRN: [id number] ? ?Hypertension ?This is a chronic problem. The current episode started more than 1 year ago. Risk factors for coronary artery disease include dyslipidemia and post-menopausal state. Treatments tried: norvasc. There are no compliance problems.   ? ?Essential hypertension ? ?Hypokalemia - Plan: potassium chloride (KLOR-CON) 10 MEQ tablet ? ?Immunization due - Plan: Pneumococcal conjugate vaccine 20-valent (Prevnar 20) ? ?Insomnia, unspecified type ? ?Patient does take her blood pressure medicine regular basis ?Tries watch her diet ?States her moods overall doing well ?States she suffers with chronic insomnia issues ?Has a hard time falling asleep hard time staying asleep ?She lost her husband a few years ago ?She states 1 time she did take Ambien but that caused her to sleep walk ?She does not want to be on anything that would be difficult for her to wake up since she lives by herself but at the same time she states in the past she has tried several different medications and relates that half a tablet of Phenergan and on some evenings a full tablet of Phenergan has been most effective at helping her sleep and does not make her feel groggy in the morning. ? ?Review of Systems ? ?   ?Objective:  ? Physical Exam ?General-in no acute distress ?Eyes-no discharge ?Lungs-respiratory rate normal, CTA ?CV-no murmurs,RRR ?Extremities skin warm dry no edema ?Neuro grossly normal ?Behavior normal, alert ? ? ? ? ?   ?Assessment & Plan:  ? ?1. Essential hypertension ?Blood pressure fair control.  Patient is to start checking her blood pressure more often and send [place] some readings.  May need some adjustment.  She states she does get worked up when she comes to a [place] office ? ?2. Hypokalemia ?History of hypokalemia continue potassium. ?- potassium chloride (KLOR-CON) 10 MEQ tablet; TAKE ONE TABLET (10MEQ TOTAL) BY  MOUTH  DAILY  Dispense: 90 tablet; Refill: 1 ? ?3. Immunization due ?Pneumococcal 20 today ?- Pneumococcal conjugate vaccine 20-valent (Prevnar 20) ? ?4. Insomnia, unspecified type ?Phenergan as discussed above ?Patient was encouraged to just try to do 1/2 tablet in the evening but when necessary a full tablet ?She was also cautioned that this could cause drowsiness not to drive with it ?If she does have to get up in the middle of the night turn a light on in order to lessen the risk of falling ? ?Hyperlipidemia doing well with the low-dose statin continue this watch diet ? ?Hypothyroidism takes her medicine appropriately TSH looks good ? ?Follow-up by August September ? ?Once again send [place] blood pressure readings within the next few weeks ? ?

## 2021-08-23 ENCOUNTER — Encounter: Payer: Self-pay | Admitting: Nurse Practitioner

## 2021-08-23 ENCOUNTER — Ambulatory Visit (INDEPENDENT_AMBULATORY_CARE_PROVIDER_SITE_OTHER): Payer: Medicare HMO | Admitting: Nurse Practitioner

## 2021-08-23 ENCOUNTER — Other Ambulatory Visit: Payer: Self-pay

## 2021-08-23 VITALS — BP 145/83 | HR 114 | Temp 97.2°F | Wt 139.8 lb

## 2021-08-23 DIAGNOSIS — R309 Painful micturition, unspecified: Secondary | ICD-10-CM | POA: Diagnosis not present

## 2021-08-23 LAB — POCT URINALYSIS DIPSTICK
Protein, UA: POSITIVE — AB
Spec Grav, UA: 1.02 (ref 1.010–1.025)
pH, UA: 6 (ref 5.0–8.0)

## 2021-08-23 MED ORDER — CEFDINIR 300 MG PO CAPS: 300.0000 mg | ORAL_CAPSULE | Freq: Two times a day (BID) | ORAL | 0 refills | Status: AC

## 2021-08-23 NOTE — Progress Notes (Signed)
? ?  Subjective:  ? ? Patient ID: Cheryl Gordon, female    DOB: 01-13-1956, 66 y.o.   MRN: [id number] ? ?HPI ? ?66 year old female patient presents to the clinic today with complaints of painful urination that started last night.  Patient states that she started having painful urination after she took a bath last night utilizing a new soap that she does not normally use.  Patient states that the burning feels better today however she just feels like "something does not feel right down there".  Patient is currently taking Azo which she states has helped.  Patient denies any fevers, chills, body aches, backache, blood in urine. ? ? ?Review of Systems  ?All other systems reviewed and are negative. ? ?   ?Objective:  ? Physical Exam ?Vitals reviewed.  ?Constitutional:   ?   General: She is not in acute distress. ?   Appearance: Normal appearance. She is normal weight. She is not ill-appearing, toxic-appearing or diaphoretic.  ?Cardiovascular:  ?   Rate and Rhythm: Normal rate and regular rhythm.  ?   Pulses: Normal pulses.  ?   Heart sounds: Normal heart sounds. No murmur heard. ?Pulmonary:  ?   Effort: Pulmonary effort is normal. No respiratory distress.  ?   Breath sounds: Normal breath sounds. No wheezing.  ?Abdominal:  ?   General: Abdomen is flat.  ?   Tenderness: There is no right CVA tenderness or left CVA tenderness.  ?Musculoskeletal:     ?   General: Normal range of motion.  ?Skin: ?   General: Skin is warm.  ?   Capillary Refill: Capillary refill takes less than 2 seconds.  ?Neurological:  ?   General: No focal deficit present.  ?   Mental Status: She is alert and oriented to person, place, and time.  ?Psychiatric:     ?   Mood and Affect: Mood normal.     ?   Behavior: Behavior normal.  ? ? ? ? ? ?   ?Assessment & Plan:  ? ?1. Painful urination ?-Likely UTI ?-We will treat with Omnicef 300 mg twice daily for 5 days.  Considered treatment with Bactrim however patient allergic to sulfa antibiotics. ?- POCT  Urinalysis Dipstick ?- Urine Culture ?- cefdinir (OMNICEF) 300 MG capsule; Take 1 capsule (300 mg total) by mouth 2 (two) times daily for 5 days.  Dispense: 10 capsule; Refill: 0 ?-Return to clinic if symptoms not better within 2 to 3 days of taking antibiotic or if symptoms are worsening. ? ?  ?Note:  This document was prepared using Dragon voice recognition software and may include unintentional dictation errors. ? ? ?

## 2021-08-25 LAB — URINE CULTURE

## 2021-08-25 LAB — SPECIMEN STATUS REPORT

## 2021-08-29 DIAGNOSIS — F1110 Opioid abuse, uncomplicated: Secondary | ICD-10-CM | POA: Diagnosis not present

## 2021-09-02 DIAGNOSIS — M76891 Other specified enthesopathies of right lower limb, excluding foot: Secondary | ICD-10-CM | POA: Diagnosis not present

## 2021-09-15 ENCOUNTER — Other Ambulatory Visit: Payer: Self-pay | Admitting: Family Medicine

## 2021-09-16 DIAGNOSIS — M25561 Pain in right knee: Secondary | ICD-10-CM | POA: Diagnosis not present

## 2021-09-19 ENCOUNTER — Other Ambulatory Visit (HOSPITAL_COMMUNITY): Payer: Self-pay | Admitting: Orthopedic Surgery

## 2021-09-19 ENCOUNTER — Other Ambulatory Visit: Payer: Self-pay | Admitting: Orthopedic Surgery

## 2021-09-19 DIAGNOSIS — M25561 Pain in right knee: Secondary | ICD-10-CM

## 2021-10-06 ENCOUNTER — Ambulatory Visit (HOSPITAL_COMMUNITY)
Admission: RE | Admit: 2021-10-06 | Discharge: 2021-10-06 | Disposition: A | Discharge status: Home / Self Care | Payer: Medicare HMO | Source: Ambulatory Visit | Attending: Orthopedic Surgery | Admitting: Orthopedic Surgery

## 2021-10-06 DIAGNOSIS — M25561 Pain in right knee: Secondary | ICD-10-CM | POA: Diagnosis not present

## 2021-10-24 DIAGNOSIS — F1110 Opioid abuse, uncomplicated: Secondary | ICD-10-CM | POA: Diagnosis not present

## 2021-10-28 DIAGNOSIS — M25561 Pain in right knee: Secondary | ICD-10-CM | POA: Diagnosis not present

## 2021-11-14 DIAGNOSIS — K5730 Diverticulosis of large intestine without perforation or abscess without bleeding: Secondary | ICD-10-CM | POA: Insufficient documentation

## 2021-11-15 ENCOUNTER — Ambulatory Visit (INDEPENDENT_AMBULATORY_CARE_PROVIDER_SITE_OTHER): Payer: Medicare HMO

## 2021-11-15 VITALS — Ht 61.0 in | Wt 139.0 lb

## 2021-11-15 DIAGNOSIS — Z0000 Encounter for general adult medical examination without abnormal findings: Secondary | ICD-10-CM

## 2021-11-15 NOTE — Progress Notes (Signed)
Subjective:   Cheryl Gordon is a 65 y.o. female who presents for Medicare Annual (Subsequent) preventive examination. Virtual Visit via Telephone Note  I connected with  Cheryl Gordon on 11/15/21 at  2:00 PM EDT by telephone and verified that I am speaking with the correct person using two identifiers.  Location: Patient: HOME  Provider: [facility] Persons participating in the virtual visit: patient/Nurse Health Advisor   I discussed the limitations, risks, security and privacy concerns of performing an evaluation and management service by telephone and the availability of in person appointments. The patient expressed understanding and agreed to proceed.  Interactive audio and video telecommunications were attempted between this nurse and patient, however failed, due to patient having technical difficulties OR patient did not have access to video capability.  We continued and completed visit with audio only.  Some vital signs may be absent or patient reported.   Cheryl Driver, LPN  Review of Systems     Cardiac Risk Factors include: advanced age (>15mn, >>84women);hypertension;dyslipidemia;sedentary lifestyle;Other (see comment), Risk factor comments: Chronic Back pain.     Objective:    Today's Vitals   11/15/21 1354  Weight: 139 lb (63 kg)  Height: '5\' 1"'$  (1.549 m)   Body mass index is 26.26 kg/m.     11/15/2021    2:00 PM 02/20/2021    1:39 AM 02/19/2021    6:58 PM 11/09/2020    1:12 PM 08/19/2015   12:00 PM 08/17/2015   11:57 PM 08/17/2015    5:11 PM  Advanced Directives  Does Patient Have a Medical Advance Directive? Yes Yes No Yes No No No  Type of [place]  Living will;Healthcare Power of Attorney     Does patient want to make changes to medical advance directive?  No - Patient declined  No - Patient declined     Copy of [place] Chart? No - copy requested   No - copy requested      Would patient like information on creating a medical advance directive?  No - Patient declined   Yes - Educational materials given Yes - Spiritual care consult ordered No - patient declined information    Current Medications (verified) Outpatient Encounter Medications as of 11/15/2021  Medication Sig   acetaminophen (TYLENOL) 500 MG tablet Take 1,000 mg by mouth every 6 (six) hours as needed for moderate pain.   albuterol (VENTOLIN HFA) 108 (90 Base) MCG/ACT inhaler Inhale 2 puffs into the lungs every 4 (four) hours as needed for wheezing or shortness of breath.   amLODipine (NORVASC) 5 MG tablet Take one tablet po daily   buprenorphine (SUBUTEX) 8 MG SUBL SL tablet 1 tablet under the tongue and allow to dissolve   cholecalciferol (VITAMIN D) 1000 units tablet Take 2,000 Units by mouth daily.   EPINEPHrine 0.3 mg/0.3 mL IJ SOAJ injection Use as directed for severe allergy   famotidine (PEPCID) 40 MG tablet TAKE ONE (1) TABLET BY MOUTH EVERY DAY   fluconazole (DIFLUCAN) 150 MG tablet 1 now then repeat in 3 days if still thrush   FLUoxetine (PROZAC) 20 MG capsule Take 20 mg by mouth daily.   fluticasone furoate-vilanterol (BREO ELLIPTA) 100-25 MCG/INH AEPB Inhale 1 puff into the lungs daily.   Garlic 15956MG CAPS Take 2 capsules by mouth daily.   levothyroxine (SYNTHROID) 100 MCG tablet TAKE ONE TABLET (100 MCG TOTAL) BY MOUTHDAILY.   MULTIPLE  VITAMIN PO Take 1 tablet by mouth daily at 12 noon.   nitroGLYCERIN (NITRODUR - DOSED IN MG/24 HR) 0.2 mg/hr patch Place 0.2 mg onto the skin daily.   potassium chloride (KLOR-CON) 10 MEQ tablet TAKE ONE TABLET (10MEQ TOTAL) BY MOUTH  DAILY   promethazine (PHENERGAN) 25 MG tablet 1/2 to 1 qhs prn as directed   rosuvastatin (CRESTOR) 5 MG tablet TAKE ONE (1) TABLET BY MOUTH EVERY DAY   [DISCONTINUED] buprenorphine (SUBUTEX) 8 MG SUBL SL tablet Place 8 mg under the tongue daily.    No facility-administered encounter medications on file as of 11/15/2021.     Allergies (verified) Ace inhibitors, Mirabegron, Nsaids, Sulfa antibiotics, and Zithromax [azithromycin]   History: Past Medical History:  Diagnosis Date   Chronic pain    Dependency on pain medication ([place])    Hyperlipidemia    Hypertension 2002   Hypothyroid    Interstitial cystitis    Migraine headache    Spina bifida ([place])    Past Surgical History:  Procedure Laterality Date   ABDOMINAL HYSTERECTOMY     CESAREAN SECTION     x3   COLONOSCOPY     OOPHORECTOMY     Family History  Problem Relation Age of Onset   Hypertension Father    Thyroid disease Father    Hypertension Mother    Social History   Socioeconomic History   Marital status: Widowed    Spouse name: Not on file   Number of children: Not on file   Years of education: Not on file   Highest education level: Not on file  Occupational History   Not on file  Tobacco Use   Smoking status: Former   Smokeless tobacco: Never  Substance and Sexual Activity   Alcohol use: No    Alcohol/week: 0.0 standard drinks of alcohol   Drug use: No   Sexual activity: Not on file  Other Topics Concern   Not on file  Social History Narrative   Not on file   Social Determinants of Health   Financial Resource Strain: Low Risk  (11/15/2021)   Overall Financial Resource Strain (CARDIA)    Difficulty of Paying Living Expenses: Not hard at all  Food Insecurity: No Food Insecurity (11/15/2021)   Hunger Vital Sign    Worried About Running Out of Food in the Last Year: Never true    Ran Out of Food in the Last Year: Never true  Transportation Needs: No Transportation Needs (11/15/2021)   PRAPARE - Hydrologist (Medical): No    Lack of Transportation (Non-Medical): No  Physical Activity: Insufficiently Active (11/15/2021)   Exercise Vital Sign    Days of Exercise per Week: 3 days    Minutes of Exercise per Session: 30 min  Stress: No Stress Concern Present (11/15/2021)   [place]    Feeling of Stress : Not at all  Social Connections: [place] (11/15/2021)   Social Connection and Isolation Panel [NHANES]    Frequency of Communication with Friends and Family: More than three times a week    Frequency of Social Gatherings with Friends and Family: More than three times a week    Attends Religious Services: More than 4 times per year    Active Member of [employer] or Organizations: Yes    Attends Music therapist: More than 4 times per year    Marital Status: Married    Tobacco  Counseling Counseling given: Not Answered   Clinical Intake:  Pre-visit preparation completed: Yes  Pain : No/denies pain     BMI - recorded: 26.26 Nutritional Status: BMI 25 -29 Overweight Nutritional Risks: None Diabetes: No  How often do you need to have someone help you when you read instructions, pamphlets, or other written materials from your doctor or pharmacy?: 1 - Never  Diabetic?NO  Interpreter Needed?: No  Information entered by :: mj Keslee Harrington, lpn   Activities of Daily Living    11/15/2021    2:02 PM 02/20/2021    1:41 AM  In your present state of health, do you have any difficulty performing the following activities:  Hearing? 0 0  Vision? 0 0  Difficulty concentrating or making decisions? 0 0  Walking or climbing stairs? 0 0  Dressing or bathing? 0 0  Doing errands, shopping? 0 0  Preparing Food and eating ? N   Using the Toilet? N   In the past six months, have you accidently leaked urine? Y   Comment at times.   Do you have problems with loss of bowel control? N   Managing your Medications? N   Managing your Finances? N   Housekeeping or managing your Housekeeping? N     Patient Care Team: Kathyrn Drown, MD as PCP - General (Family Medicine)  Indicate any recent Medical Services you may have received from other than [facility] providers in the past year (date may be  approximate).     Assessment:   This is a routine wellness examination for Aili.  Hearing/Vision screen Hearing Screening - Comments:: No hearing issues.  Vision Screening - Comments:: Readers. Dr. Katy Fitch. 2022.  Dietary issues and exercise activities discussed: Current Exercise Habits: Home exercise routine, Type of exercise: walking, Time (Minutes): 30, Frequency (Times/Week): 3, Weekly Exercise (Minutes/Week): 90, Intensity: Mild, Exercise limited by: cardiac condition(s);orthopedic condition(s)   Goals Addressed             This Visit's Progress    Exercise 3x per week (30 min per time)       Increase as tolerated.        Depression Screen    11/15/2021    1:58 PM 08/16/2021    1:15 PM 12/08/2020   11:03 AM 11/09/2020    1:15 PM 06/23/2020    2:43 PM 01/23/2017    1:34 PM  PHQ 2/9 Scores  PHQ - 2 Score 0 0 0 0 0 1  PHQ- 9 Score     0     Fall Risk    11/15/2021    2:00 PM 12/08/2020   11:04 AM 11/09/2020    1:14 PM 06/23/2020    2:06 PM 12/22/2019    3:46 PM  Fall Risk   Falls in the past year? 1 0 0 0 0  Number falls in past yr: 0 0 0    Injury with Fall? 0 0 0    Risk for fall due to : Impaired balance/gait;History of fall(s)  No Fall Risks    Follow up Falls prevention discussed Falls evaluation completed Falls evaluation completed;Falls prevention discussed Falls evaluation completed Falls evaluation completed    FALL RISK PREVENTION PERTAINING TO THE HOME:  Any stairs in or around the home? Yes  If so, are there any without handrails? No  Home free of loose throw rugs in walkways, pet beds, electrical cords, etc? Yes  Adequate lighting in your home to reduce risk of falls? Yes  ASSISTIVE DEVICES UTILIZED TO PREVENT FALLS:  Life alert? No  Use of a cane, walker or w/c? No  Grab bars in the bathroom? No  Shower chair or bench in shower? No  Walk in shower. Elevated toilet seat or a handicapped toilet? No   TIMED UP AND GO:  Was the test performed? No .   Phone visit.  Cognitive Function:        11/15/2021    2:08 PM  6CIT Screen  What Year? 0 points  What month? 0 points  What time? 0 points  Count back from 20 0 points  Months in reverse 0 points  Repeat phrase 0 points  Total Score 0 points    Immunizations Immunization History  Administered Date(s) Administered   PFIZER Comirnaty(Gray Top)Covid-19 Tri-Sucrose Vaccine 05/14/2020, 06/13/2020   PNEUMOCOCCAL CONJUGATE-20 08/16/2021   Td 01/16/2002   Tdap 06/19/2018    TDAP status: Up to date  Flu Vaccine status: Up to date  Pneumococcal vaccine status: Up to date  Covid-19 vaccine status: Completed vaccines  Qualifies for Shingles Vaccine? Yes   Zostavax completed No   Shingrix Completed?: No.    Education has been provided regarding the importance of this vaccine. Patient has been advised to call insurance company to determine out of pocket expense if they have not yet received this vaccine. Advised may also receive vaccine at local pharmacy or Health Dept. Verbalized acceptance and understanding.  Screening Tests Health Maintenance  Topic Date Due   COVID-19 Vaccine (3 - Pfizer risk series) 12/01/2021 (Originally 07/11/2020)   DEXA SCAN  12/30/2021 (Originally 09/29/2019)   Zoster Vaccines- Shingrix (1 of 2) 06/02/2022 (Originally 09/28/1974)   MAMMOGRAM  04/05/2022   COLONOSCOPY (Pts 45-24yr Insurance coverage will need to be confirmed)  06/27/2026   TETANUS/TDAP  06/19/2028   Pneumonia Vaccine 67 Years old  Completed   Hepatitis C Screening  Completed   HPV VACCINES  Aged Out   INFLUENZA VACCINE  Discontinued    Health Maintenance  There are no preventive care reminders to display for this patient.   Colorectal cancer screening: Type of screening: Colonoscopy. Completed 06/27/2021. Repeat every 5 years  Mammogram status: Completed Scheduled for 01/2022 per pt. Repeat every year  Bone Density status: Ordered Scheduled for 01/2022 per pt. . Pt provided with  contact info and advised to call to schedule appt.  Lung Cancer Screening: (Low Dose CT Chest recommended if Age 10331-80years, 30 pack-year currently smoking OR have quit w/in 15years.) does not qualify.    Additional Screening:  Hepatitis C Screening: does qualify; Completed 08/10/2021  Vision Screening: Recommended annual ophthalmology exams for early detection of glaucoma and other disorders of the eye. Is the patient up to date with their annual eye exam?  Yes  Who is the provider or what is the name of the office in which the patient attends annual eye exams? Dr. GKaty FitchIf pt is not established with a provider, would they like to be referred to a provider to establish care? No .   Dental Screening: Recommended annual dental exams for proper oral hygiene  Community Resource Referral / Chronic Care Management: CRR required this visit?  No   CCM required this visit?  No      Plan:     I have personally reviewed and noted the following in the patient's chart:   Medical and social history Use of alcohol, tobacco or illicit drugs  Current medications and supplements including opioid prescriptions.  Functional  ability and status Nutritional status Physical activity Advanced directives List of other physicians Hospitalizations, surgeries, and ER visits in previous 12 months Vitals Screenings to include cognitive, depression, and falls Referrals and appointments  In addition, I have reviewed and discussed with patient certain preventive protocols, quality metrics, and best practice recommendations. A written personalized care plan for preventive services as well as general preventive health recommendations were provided to patient.     Cheryl Driver, LPN   [id number]/[id number]   Nurse Notes: Pt has mammogram and DEXA done through [facility] in [place]. Pt asked to sign release so record can be sent to [place]. Pt is agreeable.

## 2021-11-15 NOTE — Patient Instructions (Addendum)
Cheryl Gordon , Thank you for taking time to come for your Medicare Wellness Visit. I appreciate your ongoing commitment to your health goals. Please review the following plan we discussed and let me know if I can assist you in the future.   Screening recommendations/referrals: Colonoscopy: Done 06/27/2021 Repeat in 5 years  Mammogram: Done 04/05/2020 Repeat annually. Please have GYN fax current result to our office. Fax # [phone].  Bone Density: Done 01/03/2008 Repeat every 2 years. Please have GYN fax current result to our office. Fax # [phone].  Recommended yearly ophthalmology/optometry visit for glaucoma screening and checkup Recommended yearly dental visit for hygiene and checkup  Vaccinations: Influenza vaccine: Due Fall 2023. Pneumococcal vaccine: Done 3/142023 Tdap vaccine: Done 06/19/2018 Repeat in 10 years  Shingles vaccine: Discussed.   Covid-19:Done 06/13/2020 and 05/14/2020  Advanced directives: Please bring a copy of your health care power of attorney and living will to the office to be added to your chart at your convenience.   Conditions/risks identified: Aim for 30 minutes of exercise or brisk walking, 6-8 glasses of water, and 5 servings of fruits and vegetables each day.   Next appointment: Follow up in one year for your annual wellness visit 2024.   Preventive Care 57 Years and Older, Female Preventive care refers to lifestyle choices and visits with your health care provider that can promote health and wellness. What does preventive care include? A yearly physical exam. This is also called an annual well check. Dental exams once or twice a year. Routine eye exams. Ask your health care provider how often you should have your eyes checked. Personal lifestyle choices, including: Daily care of your teeth and gums. Regular physical activity. Eating a healthy diet. Avoiding tobacco and drug use. Limiting alcohol use. Practicing safe sex. Taking low-dose  aspirin every day. Taking vitamin and mineral supplements as recommended by your health care provider. What happens during an annual well check? The services and screenings done by your health care provider during your annual well check will depend on your age, overall health, lifestyle risk factors, and family history of disease. Counseling  Your health care provider may ask you questions about your: Alcohol use. Tobacco use. Drug use. Emotional well-being. Home and relationship well-being. Sexual activity. Eating habits. History of falls. Memory and ability to understand (cognition). Work and work Statistician. Reproductive health. Screening  You may have the following tests or measurements: Height, weight, and BMI. Blood pressure. Lipid and cholesterol levels. These may be checked every 5 years, or more frequently if you are over 101 years old. Skin check. Lung cancer screening. You may have this screening every year starting at age 72 if you have a 30-pack-year history of smoking and currently smoke or have quit within the past 15 years. Fecal occult blood test (FOBT) of the stool. You may have this test every year starting at age 69. Flexible sigmoidoscopy or colonoscopy. You may have a sigmoidoscopy every 5 years or a colonoscopy every 10 years starting at age 54. Hepatitis C blood test. Hepatitis B blood test. Sexually transmitted disease (STD) testing. Diabetes screening. This is done by checking your blood sugar (glucose) after you have not eaten for a while (fasting). You may have this done every 1-3 years. Bone density scan. This is done to screen for osteoporosis. You may have this done starting at age 86. Mammogram. This may be done every 1-2 years. Talk to your health care provider about how often you should have regular mammograms.  Talk with your health care provider about your test results, treatment options, and if necessary, the need for more tests. Vaccines  Your  health care provider may recommend certain vaccines, such as: Influenza vaccine. This is recommended every year. Tetanus, diphtheria, and acellular pertussis (Tdap, Td) vaccine. You may need a Td booster every 10 years. Zoster vaccine. You may need this after age 31. Pneumococcal 13-valent conjugate (PCV13) vaccine. One dose is recommended after age 66. Pneumococcal polysaccharide (PPSV23) vaccine. One dose is recommended after age 7. Talk to your health care provider about which screenings and vaccines you need and how often you need them. This information is not intended to replace advice given to you by your health care provider. Make sure you discuss any questions you have with your health care provider. Document Released: 06/18/2015 Document Revised: 02/09/2016 Document Reviewed: 03/23/2015 Elsevier Interactive Patient Education  2017 [place] Prevention in the Home Falls can cause injuries. They can happen to people of all ages. There are many things you can do to make your home safe and to help prevent falls. What can I do on the outside of my home? Regularly fix the edges of walkways and driveways and fix any cracks. Remove anything that might make you trip as you walk through a door, such as a raised step or threshold. Trim any bushes or trees on the path to your home. Use bright outdoor lighting. Clear any walking paths of anything that might make someone trip, such as rocks or tools. Regularly check to see if handrails are loose or broken. Make sure that both sides of any steps have handrails. Any raised decks and porches should have guardrails on the edges. Have any leaves, snow, or ice cleared regularly. Use sand or salt on walking paths during winter. Clean up any spills in your garage right away. This includes oil or grease spills. What can I do in the bathroom? Use night lights. Install grab bars by the toilet and in the tub and shower. Do not use towel bars as  grab bars. Use non-skid mats or decals in the tub or shower. If you need to sit down in the shower, use a plastic, non-slip stool. Keep the floor dry. Clean up any water that spills on the floor as soon as it happens. Remove soap buildup in the tub or shower regularly. Attach bath mats securely with double-sided non-slip rug tape. Do not have throw rugs and other things on the floor that can make you trip. What can I do in the bedroom? Use night lights. Make sure that you have a light by your bed that is easy to reach. Do not use any sheets or blankets that are too big for your bed. They should not hang down onto the floor. Have a firm chair that has side arms. You can use this for support while you get dressed. Do not have throw rugs and other things on the floor that can make you trip. What can I do in the kitchen? Clean up any spills right away. Avoid walking on wet floors. Keep items that you use a lot in easy-to-reach places. If you need to reach something above you, use a strong step stool that has a grab bar. Keep electrical cords out of the way. Do not use floor polish or wax that makes floors slippery. If you must use wax, use non-skid floor wax. Do not have throw rugs and other things on the floor that can make  you trip. What can I do with my stairs? Do not leave any items on the stairs. Make sure that there are handrails on both sides of the stairs and use them. Fix handrails that are broken or loose. Make sure that handrails are as long as the stairways. Check any carpeting to make sure that it is firmly attached to the stairs. Fix any carpet that is loose or worn. Avoid having throw rugs at the top or bottom of the stairs. If you do have throw rugs, attach them to the floor with carpet tape. Make sure that you have a light switch at the top of the stairs and the bottom of the stairs. If you do not have them, ask someone to add them for you. What else can I do to help prevent  falls? Wear shoes that: Do not have high heels. Have rubber bottoms. Are comfortable and fit you well. Are closed at the toe. Do not wear sandals. If you use a stepladder: Make sure that it is fully opened. Do not climb a closed stepladder. Make sure that both sides of the stepladder are locked into place. Ask someone to hold it for you, if possible. Clearly mark and make sure that you can see: Any grab bars or handrails. First and last steps. Where the edge of each step is. Use tools that help you move around (mobility aids) if they are needed. These include: Canes. Walkers. Scooters. Crutches. Turn on the lights when you go into a dark area. Replace any light bulbs as soon as they burn out. Set up your furniture so you have a clear path. Avoid moving your furniture around. If any of your floors are uneven, fix them. If there are any pets around you, be aware of where they are. Review your medicines with your doctor. Some medicines can make you feel dizzy. This can increase your chance of falling. Ask your doctor what other things that you can do to help prevent falls. This information is not intended to replace advice given to you by your health care provider. Make sure you discuss any questions you have with your health care provider. Document Released: 03/18/2009 Document Revised: 10/28/2015 Document Reviewed: 06/26/2014 Elsevier Interactive Patient Education  2017 [employer].

## 2021-12-15 DIAGNOSIS — F1110 Opioid abuse, uncomplicated: Secondary | ICD-10-CM | POA: Diagnosis not present

## 2021-12-20 ENCOUNTER — Other Ambulatory Visit: Payer: Self-pay | Admitting: Family Medicine

## 2022-01-04 DIAGNOSIS — L820 Inflamed seborrheic keratosis: Secondary | ICD-10-CM | POA: Diagnosis not present

## 2022-01-04 DIAGNOSIS — B078 Other viral warts: Secondary | ICD-10-CM | POA: Diagnosis not present

## 2022-01-04 DIAGNOSIS — L570 Actinic keratosis: Secondary | ICD-10-CM | POA: Diagnosis not present

## 2022-01-04 DIAGNOSIS — X32XXXA Exposure to sunlight, initial encounter: Secondary | ICD-10-CM | POA: Diagnosis not present

## 2022-01-16 ENCOUNTER — Ambulatory Visit: Payer: Self-pay | Admitting: Family Medicine

## 2022-01-17 DIAGNOSIS — Z6827 Body mass index (BMI) 27.0-27.9, adult: Secondary | ICD-10-CM | POA: Diagnosis not present

## 2022-01-17 DIAGNOSIS — Z1231 Encounter for screening mammogram for malignant neoplasm of breast: Secondary | ICD-10-CM | POA: Diagnosis not present

## 2022-01-17 DIAGNOSIS — Z01419 Encounter for gynecological examination (general) (routine) without abnormal findings: Secondary | ICD-10-CM | POA: Diagnosis not present

## 2022-02-02 ENCOUNTER — Other Ambulatory Visit: Payer: Self-pay | Admitting: *Deleted

## 2022-02-02 DIAGNOSIS — R911 Solitary pulmonary nodule: Secondary | ICD-10-CM

## 2022-02-08 ENCOUNTER — Other Ambulatory Visit: Payer: Self-pay | Admitting: Family Medicine

## 2022-02-13 DIAGNOSIS — F1110 Opioid abuse, uncomplicated: Secondary | ICD-10-CM | POA: Diagnosis not present

## 2022-03-13 DIAGNOSIS — F1110 Opioid abuse, uncomplicated: Secondary | ICD-10-CM | POA: Diagnosis not present

## 2022-03-22 ENCOUNTER — Ambulatory Visit (HOSPITAL_COMMUNITY): Payer: Medicare HMO

## 2022-03-30 DIAGNOSIS — F1110 Opioid abuse, uncomplicated: Secondary | ICD-10-CM | POA: Diagnosis not present

## 2022-04-05 ENCOUNTER — Other Ambulatory Visit: Payer: Self-pay | Admitting: Family Medicine

## 2022-04-05 DIAGNOSIS — E876 Hypokalemia: Secondary | ICD-10-CM

## 2022-04-09 ENCOUNTER — Encounter: Payer: Self-pay | Admitting: Family Medicine

## 2022-04-13 DIAGNOSIS — F1110 Opioid abuse, uncomplicated: Secondary | ICD-10-CM | POA: Diagnosis not present

## 2022-04-25 ENCOUNTER — Encounter: Payer: Self-pay | Admitting: Family Medicine

## 2022-04-25 ENCOUNTER — Ambulatory Visit (INDEPENDENT_AMBULATORY_CARE_PROVIDER_SITE_OTHER): Payer: Medicare HMO | Admitting: Family Medicine

## 2022-04-25 VITALS — BP 122/76 | Wt 147.2 lb

## 2022-04-25 DIAGNOSIS — E785 Hyperlipidemia, unspecified: Secondary | ICD-10-CM | POA: Diagnosis not present

## 2022-04-25 DIAGNOSIS — E876 Hypokalemia: Secondary | ICD-10-CM

## 2022-04-25 DIAGNOSIS — R7989 Other specified abnormal findings of blood chemistry: Secondary | ICD-10-CM

## 2022-04-25 DIAGNOSIS — I10 Essential (primary) hypertension: Secondary | ICD-10-CM | POA: Diagnosis not present

## 2022-04-25 DIAGNOSIS — Z23 Encounter for immunization: Secondary | ICD-10-CM | POA: Diagnosis not present

## 2022-04-25 DIAGNOSIS — E039 Hypothyroidism, unspecified: Secondary | ICD-10-CM | POA: Diagnosis not present

## 2022-04-25 DIAGNOSIS — F324 Major depressive disorder, single episode, in partial remission: Secondary | ICD-10-CM

## 2022-04-25 MED ORDER — AMLODIPINE BESYLATE 5 MG PO TABS: ORAL_TABLET | ORAL | 1 refills | Status: DC

## 2022-04-25 MED ORDER — FLUOXETINE HCL 20 MG PO CAPS: 20.0000 mg | ORAL_CAPSULE | Freq: Every day | ORAL | 1 refills | Status: DC

## 2022-04-25 MED ORDER — LEVOTHYROXINE SODIUM 100 MCG PO TABS: ORAL_TABLET | ORAL | 1 refills | Status: DC

## 2022-04-25 MED ORDER — FAMOTIDINE 40 MG PO TABS: ORAL_TABLET | ORAL | 1 refills | Status: DC

## 2022-04-25 MED ORDER — POTASSIUM CHLORIDE ER 10 MEQ PO TBCR: EXTENDED_RELEASE_TABLET | ORAL | 1 refills | Status: DC

## 2022-04-25 MED ORDER — ROSUVASTATIN CALCIUM 5 MG PO TABS: ORAL_TABLET | ORAL | 1 refills | Status: DC

## 2022-04-25 MED ORDER — PROMETHAZINE HCL 25 MG PO TABS: ORAL_TABLET | ORAL | 5 refills | Status: DC

## 2022-04-25 NOTE — Progress Notes (Signed)
   Subjective:    Patient ID: Cheryl Gordon, female    DOB: 08-22-1955, 66 y.o.   MRN: [id number]  HPI Pt arrives for follow up. Pt states she is doing fine.  Pt states last time she checked and her blood pressure was good. No s/s.   Pt is seeing a doctor in [place]-Psychiatrist- pt has been on antidepressant for years. Doctor is retiring in December. Pt is wondering if Dr.Rogelio Winbush would take it over. (Generic Prozac 20 mg daily)   Review of Systems     Objective:   Physical Exam General-in no acute distress Eyes-no discharge Lungs-respiratory rate normal, CTA CV-no murmurs,RRR Extremities skin warm dry no edema Neuro grossly normal Behavior normal, alert        Assessment & Plan:   1. Essential hypertension HTN- patient seen for follow-up regarding HTN.   Diet, medication compliance, appropriate labs and refills were completed.   Importance of keeping blood pressure under good control to lessen the risk of complications discussed Regular follow-up visits discussed  Check kidney function urine micro protein  - Basic metabolic panel - Microalbumin/Creatinine Ratio, Urine - Lipid panel  2. Hypothyroidism, unspecified type Patient was seen today regarding hypothyroidism.  Importance of healthy diet, regular physical activity was discussed.  Importance of compliance with medication and regular checks regarding this was discussed.    3. Hypokalemia History of low potassium check lab work.  Continue potassium - potassium chloride (KLOR-CON) 10 MEQ tablet; TAKE ONE TABLET (10 MEQ TOTAL) BY MOUTH DAILY.  Dispense: 90 tablet; Refill: 1 - Basic metabolic panel - Microalbumin/Creatinine Ratio, Urine - Lipid panel  4. Elevated serum creatinine Check kidney function urine micro protein healthy diet recommended - Basic metabolic panel - Microalbumin/Creatinine Ratio, Urine - Lipid panel  5. Hyperlipidemia, unspecified hyperlipidemia type Hyperlipidemia-importance of  diet, weight control, activity, compliance with medications discussed.   Recent labs reviewed.   Any additional labs or refills ordered.   Importance of keeping under good control discussed. Regular follow-up visits discussed  - Basic metabolic panel - Microalbumin/Creatinine Ratio, Urine - Lipid panel  6. Need for vaccination Flu shot - Flu Vaccine QUAD High Dose(Fluad)  7. Depression, major, single episode, in partial remission ([place]) We will go ahead and start prescribing fluoxetine here.  She states she is under good control.  Follow-up here at least every 6 months.

## 2022-05-18 ENCOUNTER — Ambulatory Visit (INDEPENDENT_AMBULATORY_CARE_PROVIDER_SITE_OTHER): Payer: Medicare HMO | Admitting: Family Medicine

## 2022-05-18 ENCOUNTER — Encounter: Payer: Self-pay | Admitting: Family Medicine

## 2022-05-18 VITALS — BP 126/78 | HR 94 | Temp 98.1°F | Wt 144.8 lb

## 2022-05-18 DIAGNOSIS — R300 Dysuria: Secondary | ICD-10-CM | POA: Diagnosis not present

## 2022-05-18 DIAGNOSIS — N3000 Acute cystitis without hematuria: Secondary | ICD-10-CM | POA: Diagnosis not present

## 2022-05-18 LAB — POCT URINALYSIS DIP (CLINITEK)
Spec Grav, UA: 1.015 (ref 1.010–1.025)
pH, UA: 7 (ref 5.0–8.0)

## 2022-05-18 MED ORDER — CEFDINIR 300 MG PO CAPS: 300.0000 mg | ORAL_CAPSULE | Freq: Two times a day (BID) | ORAL | 0 refills | Status: DC

## 2022-05-18 NOTE — Progress Notes (Signed)
   Subjective:    Patient ID: Cheryl Gordon, female    DOB: 02/20/1956, 66 y.o.   MRN: [id number]  HPI Patient arrives today with dysuria. Dysuria urinary frequency over the past couple days no high fever chills no flank pain no vomiting diarrhea Review of Systems     Objective:   Physical Exam  Lungs clear heart regular flanks nontender      Assessment & Plan:  Urine under microscope shows WBCs Probable UTI Antibiotics prescribed warning signs discussed Culture ordered Antibiotics and

## 2022-05-22 LAB — URINE CULTURE

## 2022-05-30 IMAGING — MR MR KNEE*R* W/O CM
7 series · 40 of 40 positions shown · non-contrast
Comparison: None Available.

CLINICAL DATA: Right knee pain since the [REDACTED] of 1311. No known
injury.

EXAM:
MRI OF THE RIGHT KNEE WITHOUT CONTRAST
TECHNIQUE: Multiplanar, multisequence MR imaging of the knee was performed. No
intravenous contrast was administered.

[Series 8: T2 fat-sat · axial · right · 4.0mm · 0.47mm/px · z∈[-73,+52]mm · 7 of 26 slices shown (1 of 3)]
[im 1/26]
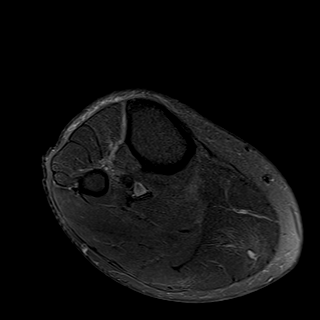
[im 5/26]
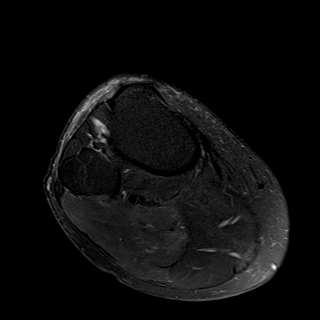
[im 9/26]
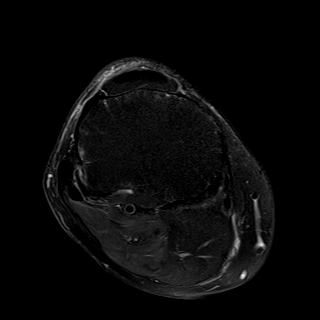
[im 13/26]
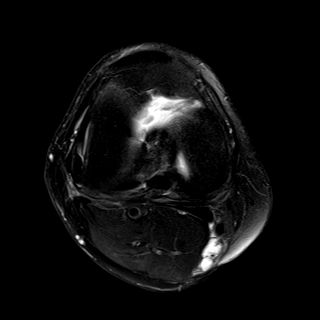
[im 17/26]
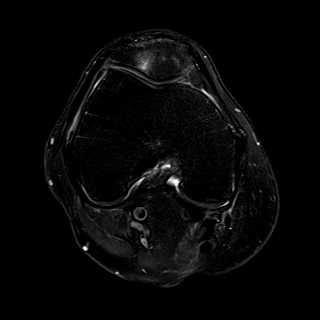
[im 21/26]
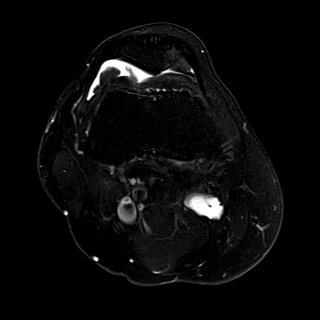
[im 26/26]
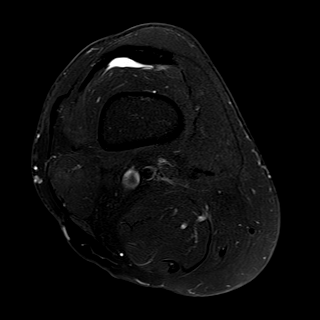

[Series 9: T1 · coronal · right · 4.0mm · 0.59mm/px · 5 of 24 slices shown]
[im 1/24]
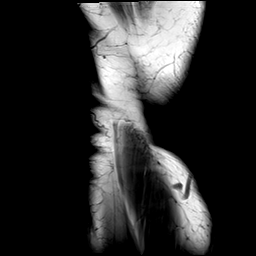
[im 6/24]
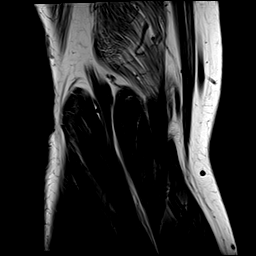
[im 12/24]
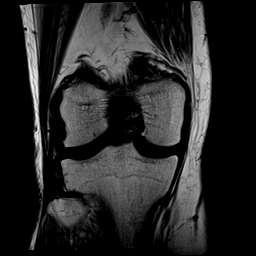
[im 18/24]
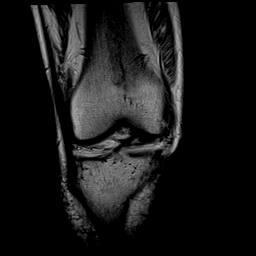
[im 24/24]
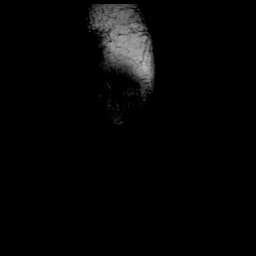

[Series 10: T2 fat-sat · coronal · right · 4.0mm · 0.59mm/px · 6 of 28 slices shown (2 of 3)]
[im 1/28]
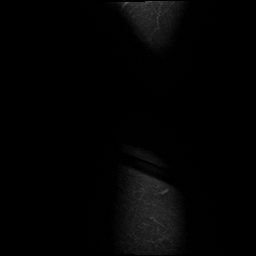
[im 6/28]
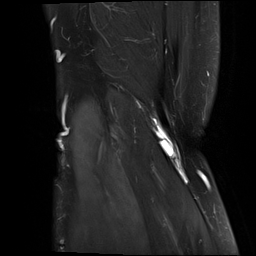
[im 11/28]
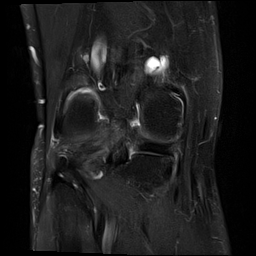
[im 17/28]
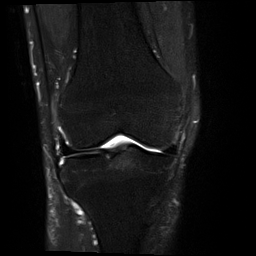
[im 22/28]
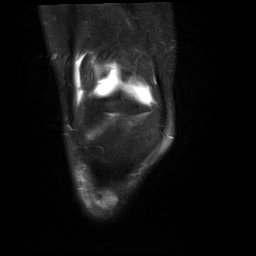
[im 28/28]
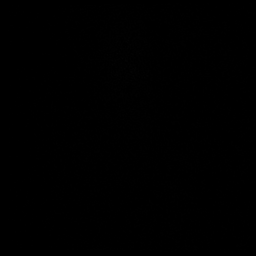

[Series 11: PD fat-sat · coronal · right · 4.0mm · 0.59mm/px · 6 of 28 slices shown (1 of 2)]
[im 1/28]
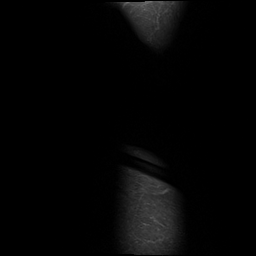
[im 6/28]
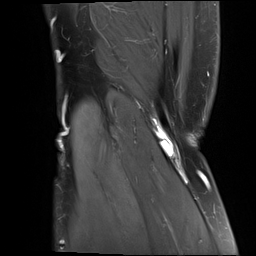
[im 11/28]
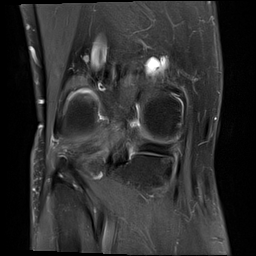
[im 17/28]
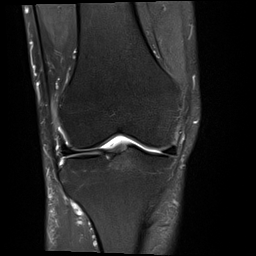
[im 22/28]
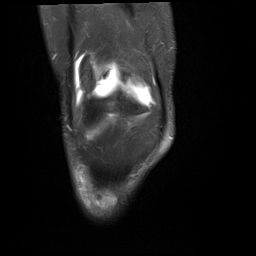
[im 28/28]
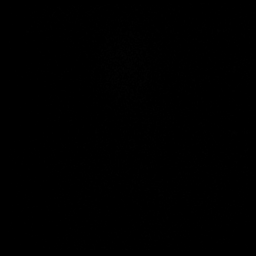

[Series 12: T2 fat-sat · sagittal · right · 3.0mm · 0.59mm/px · 6 of 28 slices shown (3 of 3)]
[im 1/28]
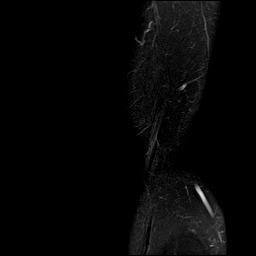
[im 6/28]
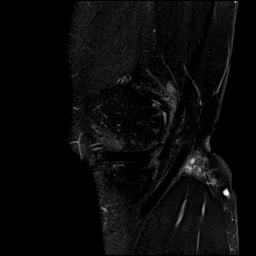
[im 11/28]
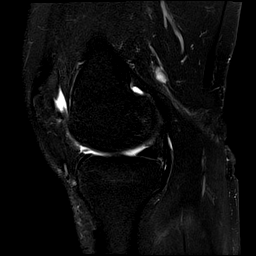
[im 17/28]
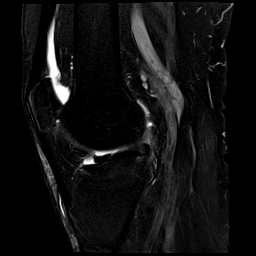
[im 22/28]
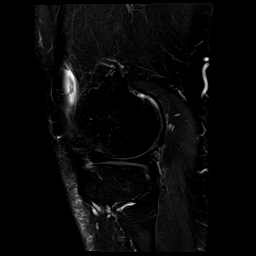
[im 28/28]
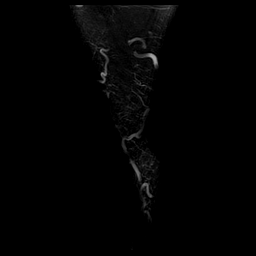

[Series 13: PD fat-sat · sagittal · right · 3.0mm · 0.52mm/px · 8 of 36 slices shown (2 of 2)]
[im 1/36]
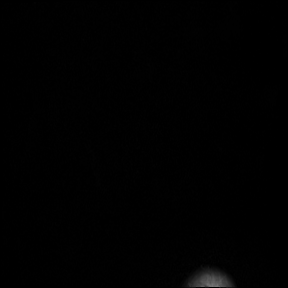
[im 6/36]
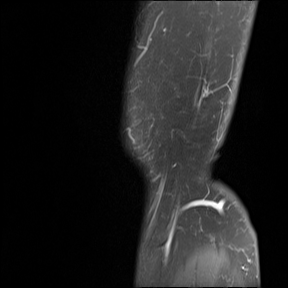
[im 11/36]
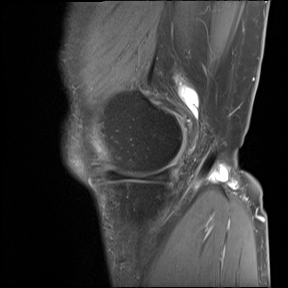
[im 16/36]
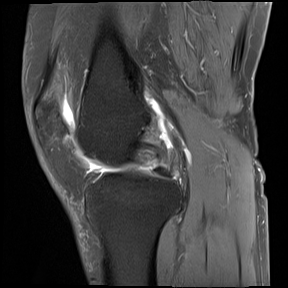
[im 21/36]
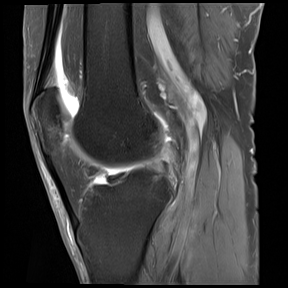
[im 26/36]
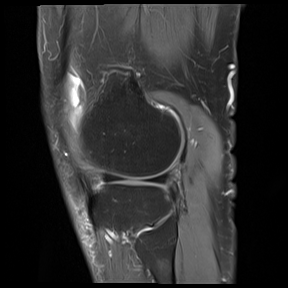
[im 31/36]
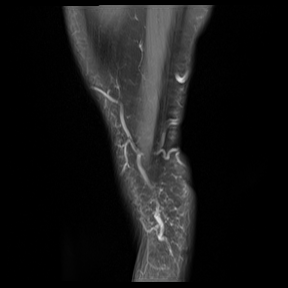
[im 36/36]
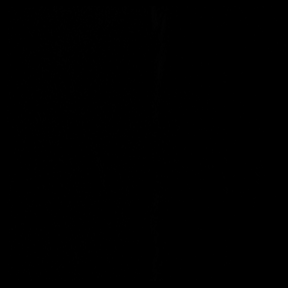

[Series 14: PD · coronal · right · 2.0mm · 0.47mm/px · 2 of 10 slices shown]
[im 1/10]
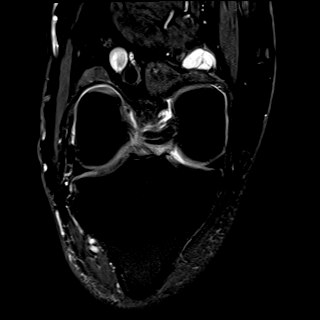
[im 10/10]
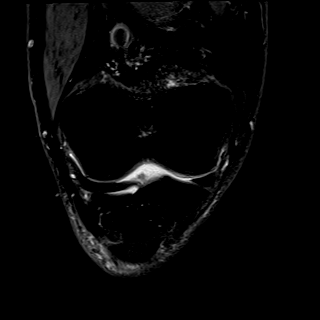

[40 of 40 positions shown; findings below may reference images not displayed]

FINDINGS: MENISCI

Medial meniscus:  Intact.

Lateral meniscus:  Intact.

LIGAMENTS

Cruciates:  Intact.  There is some mucoid degeneration of the ACL.

Collaterals:  Intact.

CARTILAGE

Patellofemoral: Scattered cartilage thinning is worst along the
medial patellar facet in the superior pole and lateral patellar
facet in the inferior pole.

Medial:  Mildly degenerated

Lateral:  Mildly degenerated.

Joint: Very small joint effusion.

Popliteal Fossa:  Small Baker's cyst.

Extensor Mechanism:  Intact.

Bones: No fracture, stress change or worrisome lesion. There is some
subchondral edema in the inferior pole of the lateral patellar
facet.

Other: None.
IMPRESSION: Negative for meniscal or ligament tear.

Mild osteoarthritis most notable in the patellofemoral compartment.

## 2022-06-16 DIAGNOSIS — Z6826 Body mass index (BMI) 26.0-26.9, adult: Secondary | ICD-10-CM | POA: Diagnosis not present

## 2022-06-16 DIAGNOSIS — E663 Overweight: Secondary | ICD-10-CM | POA: Diagnosis not present

## 2022-06-16 DIAGNOSIS — R030 Elevated blood-pressure reading, without diagnosis of hypertension: Secondary | ICD-10-CM | POA: Diagnosis not present

## 2022-06-16 DIAGNOSIS — B029 Zoster without complications: Secondary | ICD-10-CM | POA: Diagnosis not present

## 2022-06-21 ENCOUNTER — Encounter: Payer: Self-pay | Admitting: *Deleted

## 2022-06-23 DIAGNOSIS — E785 Hyperlipidemia, unspecified: Secondary | ICD-10-CM | POA: Diagnosis not present

## 2022-06-23 DIAGNOSIS — E876 Hypokalemia: Secondary | ICD-10-CM | POA: Diagnosis not present

## 2022-06-23 DIAGNOSIS — I10 Essential (primary) hypertension: Secondary | ICD-10-CM | POA: Diagnosis not present

## 2022-06-23 DIAGNOSIS — R7989 Other specified abnormal findings of blood chemistry: Secondary | ICD-10-CM | POA: Diagnosis not present

## 2022-06-25 LAB — LIPID PANEL
Chol/HDL Ratio: 3.4 ratio (ref 0.0–4.4)
Cholesterol, Total: 155 mg/dL (ref 100–199)
HDL: 46 mg/dL (ref >39)
LDL Chol Calc (NIH): 89 mg/dL (ref 0–99)
Triglycerides: 107 mg/dL (ref 0–149)
VLDL Cholesterol Cal: 20 mg/dL (ref 5–40)

## 2022-06-25 LAB — BASIC METABOLIC PANEL
BUN/Creatinine Ratio: 15 (ref 12–28)
BUN: 17 mg/dL (ref 8–27)
CO2: 26 mmol/L (ref 20–29)
Calcium: 9.3 mg/dL (ref 8.7–10.3)
Chloride: 100 mmol/L (ref 96–106)
Creatinine, Ser: 1.12 mg/dL — ABNORMAL HIGH (ref 0.57–1.00)
Glucose: 97 mg/dL (ref 70–99)
Potassium: 4.2 mmol/L (ref 3.5–5.2)
Sodium: 139 mmol/L (ref 134–144)
eGFR: 54 mL/min/{1.73_m2} — ABNORMAL LOW (ref >59)

## 2022-06-25 LAB — MICROALBUMIN / CREATININE URINE RATIO
Creatinine, Urine: 56.3 mg/dL
Microalb/Creat Ratio: <5 mg/g creat (ref 0–29)
Microalbumin, Urine: <3 ug/mL

## 2022-06-26 ENCOUNTER — Encounter: Payer: Self-pay | Admitting: Family Medicine

## 2022-06-26 NOTE — Progress Notes (Signed)
Please mail the patient a copy of the letter not sure she always uses MyChart

## 2022-07-06 DIAGNOSIS — E6609 Other obesity due to excess calories: Secondary | ICD-10-CM | POA: Diagnosis not present

## 2022-07-06 DIAGNOSIS — E162 Hypoglycemia, unspecified: Secondary | ICD-10-CM | POA: Diagnosis not present

## 2022-07-06 DIAGNOSIS — R5382 Chronic fatigue, unspecified: Secondary | ICD-10-CM | POA: Diagnosis not present

## 2022-07-06 DIAGNOSIS — E663 Overweight: Secondary | ICD-10-CM | POA: Diagnosis not present

## 2022-07-06 DIAGNOSIS — L658 Other specified nonscarring hair loss: Secondary | ICD-10-CM | POA: Diagnosis not present

## 2022-07-06 DIAGNOSIS — R891 Abnormal level of hormones in specimens from other organs, systems and tissues: Secondary | ICD-10-CM | POA: Diagnosis not present

## 2022-07-06 DIAGNOSIS — E559 Vitamin D deficiency, unspecified: Secondary | ICD-10-CM | POA: Diagnosis not present

## 2022-07-14 ENCOUNTER — Other Ambulatory Visit: Payer: Self-pay | Admitting: Family Medicine

## 2022-07-27 DIAGNOSIS — F3340 Major depressive disorder, recurrent, in remission, unspecified: Secondary | ICD-10-CM | POA: Diagnosis not present

## 2022-07-27 DIAGNOSIS — F1121 Opioid dependence, in remission: Secondary | ICD-10-CM | POA: Diagnosis not present

## 2022-08-15 ENCOUNTER — Other Ambulatory Visit: Payer: Self-pay | Admitting: Family Medicine

## 2022-09-06 DIAGNOSIS — R69 Illness, unspecified: Secondary | ICD-10-CM | POA: Diagnosis not present

## 2022-09-06 DIAGNOSIS — F1111 Opioid abuse, in remission: Secondary | ICD-10-CM | POA: Diagnosis not present

## 2022-10-03 ENCOUNTER — Encounter: Payer: Self-pay | Admitting: Family Medicine

## 2022-10-04 ENCOUNTER — Ambulatory Visit (INDEPENDENT_AMBULATORY_CARE_PROVIDER_SITE_OTHER): Payer: Medicare HMO | Admitting: Family Medicine

## 2022-10-04 VITALS — BP 129/89 | Temp 98.5°F | Ht 61.0 in | Wt 139.0 lb

## 2022-10-04 DIAGNOSIS — N390 Urinary tract infection, site not specified: Secondary | ICD-10-CM | POA: Insufficient documentation

## 2022-10-04 DIAGNOSIS — N3001 Acute cystitis with hematuria: Secondary | ICD-10-CM | POA: Diagnosis not present

## 2022-10-04 DIAGNOSIS — R300 Dysuria: Secondary | ICD-10-CM | POA: Diagnosis not present

## 2022-10-04 LAB — POCT URINALYSIS DIP (CLINITEK)
Bilirubin, UA: NEGATIVE
Glucose, UA: NEGATIVE mg/dL
Ketones, POC UA: NEGATIVE mg/dL
Nitrite, UA: NEGATIVE
POC PROTEIN,UA: NEGATIVE
Spec Grav, UA: 1.01 (ref 1.010–1.025)
Urobilinogen, UA: 0.2 E.U./dL
pH, UA: 6 (ref 5.0–8.0)

## 2022-10-04 MED ORDER — FLUCONAZOLE 150 MG PO TABS: 150.0000 mg | ORAL_TABLET | Freq: Once | ORAL | 0 refills | Status: AC

## 2022-10-04 MED ORDER — CEPHALEXIN 500 MG PO CAPS: 500.0000 mg | ORAL_CAPSULE | Freq: Two times a day (BID) | ORAL | 0 refills | Status: DC

## 2022-10-04 NOTE — Progress Notes (Signed)
Subjective:  Patient ID: Cheryl Gordon, female    DOB: 27-Jan-1956  Age: 67 y.o. MRN: [id number]  CC: Chief Complaint  Patient presents with   Dysuria    Since yesterday afternoon     HPI:  67 female presents for evaluation of the above.  Patient reports that her symptoms started yesterday.  She reports dysuria.  Has a history of UTI.  She reports that she has been using Tylenol and Azo without resolution.  Patient is concerned that she has a UTI.  No abdominal pain.  No flank pain.  No nausea or vomiting.  No fever.  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   Pain in joint of right knee 03/30/2021   Essential hypertension 02/20/2021   S/P laparoscopic supracervical hysterectomy 04/18/2019   Renal insufficiency 06/16/2016   Interstitial cystitis 12/29/2012   Urge incontinence 12/29/2012   Chronic back pain 12/29/2012   Hypothyroidism 09/19/2012   Other and unspecified hyperlipidemia 09/19/2012    Social Hx   Social History   Socioeconomic History   Marital status: Widowed    Spouse name: Not on file   Number of children: Not on file   Years of education: Not on file   Highest education level: Not on file  Occupational History   Not on file  Tobacco Use   Smoking status: Former   Smokeless tobacco: Never  Substance and Sexual Activity   Alcohol use: No    Alcohol/week: 0.0 standard drinks of alcohol   Drug use: No   Sexual activity: Not on file  Other Topics Concern   Not on file  Social History Narrative   Not on file   Social Determinants of Health   Financial Resource Strain: Low Risk  (11/15/2021)   Overall Financial Resource Strain (CARDIA)    Difficulty of Paying Living Expenses: Not hard at all  Food Insecurity: No Food Insecurity (11/15/2021)   Hunger Vital Sign    Worried About  Running Out of Food in the Last Year: Never true    Ran Out of Food in the Last Year: Never true  Transportation Needs: No Transportation Needs (11/15/2021)   PRAPARE - Administrator, Civil Service (Medical): No    Lack of Transportation (Non-Medical): No  Physical Activity: Insufficiently Active (11/15/2021)   Exercise Vital Sign    Days of Exercise per Week: 3 days    Minutes of Exercise per Session: 30 min  Stress: No Stress Concern Present (11/15/2021)   [employer] of Occupational Health - Occupational Stress Questionnaire    Feeling of Stress : Not at all  Social Connections: Socially Integrated (11/15/2021)   Social Connection and Isolation Panel [NHANES]    Frequency of Communication with Friends and Family: More than three times a week    Frequency of Social Gatherings with Friends and Family: More than three times a week    Attends Religious Services: More than 4 times per year    Active Member of [employer] or Organizations: Yes    Attends Engineer, structural: More than 4 times per year    Marital Status: Married    Review of Systems Per HPI  Objective:  BP 129/89   Temp 98.5 F (36.9 C)   Ht 5\' 1"  (1.549 m)   Wt 139 lb (63 kg)  SpO2 95%   BMI 26.26 kg/m      10/04/2022   10:34 AM 10/04/2022   10:01 AM 05/18/2022   10:59 AM  BP/Weight  Systolic BP 129 146 126  Diastolic BP 89 97 78  Wt. (Lbs)  139 144.8  BMI  26.26 kg/m2 27.36 kg/m2    Physical Exam Vitals and nursing note reviewed.  Constitutional:      Appearance: Normal appearance.  HENT:     Head: Normocephalic and atraumatic.  Cardiovascular:     Rate and Rhythm: Regular rhythm. Tachycardia present.  Abdominal:     General: There is no distension.     Palpations: Abdomen is soft.     Tenderness: There is no abdominal tenderness.  Neurological:     Mental Status: She is alert.     Lab Results  Component Value Date   WBC 7.2 02/23/2021   HGB 11.2 02/23/2021   HCT 35.3  02/23/2021   PLT 207 02/23/2021   GLUCOSE 97 06/23/2022   CHOL 155 06/23/2022   TRIG 107 06/23/2022   HDL 46 06/23/2022   LDLCALC 89 06/23/2022   ALT 16 08/10/2021   AST 19 08/10/2021   NA 139 06/23/2022   K 4.2 06/23/2022   CL 100 06/23/2022   CREATININE 1.12 (H) 06/23/2022   BUN 17 06/23/2022   CO2 26 06/23/2022   TSH 2.440 08/10/2021   INR 1.8 07/07/2014   HGBA1C 5.3 02/17/2016     Assessment & Plan:   Problem List Items Addressed This Visit       Genitourinary   UTI (urinary tract infection) - Primary    UA consistent UTI.  Sending culture.  Placing on Keflex.  Patient requested Diflucan to be used if needed.      Relevant Medications   cephALEXin (KEFLEX) 500 MG capsule   fluconazole (DIFLUCAN) 150 MG tablet   Other Relevant Orders   POCT URINALYSIS DIP (CLINITEK) (Completed)   Urine Culture    Meds ordered this encounter  Medications   cephALEXin (KEFLEX) 500 MG capsule    Sig: Take 1 capsule (500 mg total) by mouth 2 (two) times daily.    Dispense:  14 capsule    Refill:  0   fluconazole (DIFLUCAN) 150 MG tablet    Sig: Take 1 tablet (150 mg total) by mouth once for 1 dose. Repeat dose in 72 hours.    Dispense:  2 tablet    Refill:  0    Ineta Sinning DO [facility]

## 2022-10-04 NOTE — Patient Instructions (Signed)
Medication as prescribed. ° °Call with concerns. ° °Take care ° °Dr Idona Stach °

## 2022-10-04 NOTE — Assessment & Plan Note (Signed)
UA consistent UTI.  Sending culture.  Placing on Keflex.  Patient requested Diflucan to be used if needed.

## 2022-10-05 ENCOUNTER — Encounter: Payer: Self-pay | Admitting: Family Medicine

## 2022-10-06 ENCOUNTER — Encounter: Payer: Self-pay | Admitting: Family Medicine

## 2022-10-06 ENCOUNTER — Other Ambulatory Visit: Payer: Self-pay | Admitting: Family Medicine

## 2022-10-06 MED ORDER — CIPROFLOXACIN HCL 500 MG PO TABS
500.0000 mg | ORAL_TABLET | Freq: Two times a day (BID) | ORAL | 0 refills | Status: DC
Start: 2022-10-06 — End: 2022-10-24

## 2022-10-08 LAB — URINE CULTURE

## 2022-10-09 ENCOUNTER — Encounter: Payer: Self-pay | Admitting: Family Medicine

## 2022-10-09 LAB — URINE CULTURE

## 2022-10-09 LAB — SPECIMEN STATUS REPORT

## 2022-10-09 NOTE — Telephone Encounter (Signed)
I sent patient a new MyChart message requesting her to give me up-to-date information hold off on the Augmentin for now until I hear back from the patient thank you

## 2022-10-09 NOTE — Telephone Encounter (Signed)
Nurses May change to Augmentin 875 mg 1 twice daily for 7 days if not improving over the course of the next several days recommend follow-up office visit  Take medication with a snack to lessen the risk of nausea this medication can cause diarrhea with Some individuals

## 2022-10-10 DIAGNOSIS — M545 Low back pain, unspecified: Secondary | ICD-10-CM | POA: Diagnosis not present

## 2022-10-10 DIAGNOSIS — M25551 Pain in right hip: Secondary | ICD-10-CM | POA: Diagnosis not present

## 2022-10-21 DIAGNOSIS — M545 Low back pain, unspecified: Secondary | ICD-10-CM | POA: Diagnosis not present

## 2022-10-24 ENCOUNTER — Ambulatory Visit (INDEPENDENT_AMBULATORY_CARE_PROVIDER_SITE_OTHER): Payer: Medicare HMO | Admitting: Family Medicine

## 2022-10-24 VITALS — BP 136/80 | HR 102 | Ht 61.0 in | Wt 143.0 lb

## 2022-10-24 DIAGNOSIS — E039 Hypothyroidism, unspecified: Secondary | ICD-10-CM | POA: Diagnosis not present

## 2022-10-24 DIAGNOSIS — I10 Essential (primary) hypertension: Secondary | ICD-10-CM

## 2022-10-24 DIAGNOSIS — E876 Hypokalemia: Secondary | ICD-10-CM | POA: Diagnosis not present

## 2022-10-24 DIAGNOSIS — R7989 Other specified abnormal findings of blood chemistry: Secondary | ICD-10-CM

## 2022-10-24 DIAGNOSIS — Z79899 Other long term (current) drug therapy: Secondary | ICD-10-CM | POA: Diagnosis not present

## 2022-10-24 DIAGNOSIS — E785 Hyperlipidemia, unspecified: Secondary | ICD-10-CM

## 2022-10-24 MED ORDER — FLUOXETINE HCL 20 MG PO CAPS: 20.0000 mg | ORAL_CAPSULE | Freq: Every day | ORAL | 1 refills | Status: DC

## 2022-10-24 MED ORDER — LEVOTHYROXINE SODIUM 100 MCG PO TABS: ORAL_TABLET | ORAL | 1 refills | Status: DC

## 2022-10-24 MED ORDER — ROSUVASTATIN CALCIUM 5 MG PO TABS: ORAL_TABLET | ORAL | 1 refills | Status: DC

## 2022-10-24 MED ORDER — AMLODIPINE BESYLATE 5 MG PO TABS: ORAL_TABLET | ORAL | 1 refills | Status: DC

## 2022-10-24 MED ORDER — POTASSIUM CHLORIDE ER 10 MEQ PO TBCR: EXTENDED_RELEASE_TABLET | ORAL | 1 refills | Status: DC

## 2022-10-24 MED ORDER — FAMOTIDINE 40 MG PO TABS: ORAL_TABLET | ORAL | 1 refills | Status: DC

## 2022-10-24 NOTE — Progress Notes (Signed)
Subjective:    Patient ID: Cheryl Gordon, female    DOB: 1955-09-01, 67 y.o.   MRN: [id number]  HPI Patient arrives for 6 month follow up.  Essential hypertension - Plan: Basic Metabolic Panel (7)  Hypokalemia - Plan: potassium chloride (KLOR-CON) 10 MEQ tablet  Hypothyroidism, unspecified type - Plan: TSH + free T4  Hyperlipidemia, unspecified hyperlipidemia type - Plan: Lipid panel  Elevated serum creatinine - Plan: ALT Outpatient Encounter Medications as of 10/24/2022  Medication Sig   acetaminophen (TYLENOL) 500 MG tablet Take 1,000 mg by mouth every 6 (six) hours as needed for moderate pain.   albuterol (VENTOLIN HFA) 108 (90 Base) MCG/ACT inhaler Inhale 2 puffs into the lungs every 4 (four) hours as needed for wheezing or shortness of breath.   buprenorphine (SUBUTEX) 8 MG SUBL SL tablet 1 tablet under the tongue and allow to dissolve   cholecalciferol (VITAMIN D) 1000 units tablet Take 2,000 Units by mouth daily.   EPINEPHrine 0.3 mg/0.3 mL IJ SOAJ injection Use as directed for severe allergy   fluticasone furoate-vilanterol (BREO ELLIPTA) 100-25 MCG/INH AEPB Inhale 1 puff into the lungs daily.   Garlic 1000 MG CAPS Take 2 capsules by mouth daily.   MULTIPLE VITAMIN PO Take 1 tablet by mouth daily at 12 noon.   promethazine (PHENERGAN) 25 MG tablet 1/2 to 1 qhs prn as directed   [DISCONTINUED] amLODipine (NORVASC) 5 MG tablet TAKE ONE (1) TABLET BY MOUTH EVERY DAY   [DISCONTINUED] famotidine (PEPCID) 40 MG tablet 1 qd   [DISCONTINUED] FLUoxetine (PROZAC) 20 MG capsule Take 1 capsule (20 mg total) by mouth daily.   [DISCONTINUED] levothyroxine (SYNTHROID) 100 MCG tablet 1 qd   [DISCONTINUED] potassium chloride (KLOR-CON) 10 MEQ tablet TAKE ONE TABLET (10 MEQ TOTAL) BY MOUTH DAILY.   [DISCONTINUED] rosuvastatin (CRESTOR) 5 MG tablet TAKE ONE (1) TABLET BY MOUTH EVERY DAY   amLODipine (NORVASC) 5 MG tablet TAKE ONE (1) TABLET BY MOUTH EVERY DAY   famotidine (PEPCID) 40 MG  tablet 1 qd   FLUoxetine (PROZAC) 20 MG capsule Take 1 capsule (20 mg total) by mouth daily.   levothyroxine (SYNTHROID) 100 MCG tablet 1 qd   potassium chloride (KLOR-CON) 10 MEQ tablet TAKE ONE TABLET (10 MEQ TOTAL) BY MOUTH DAILY.   rosuvastatin (CRESTOR) 5 MG tablet TAKE ONE (1) TABLET BY MOUTH EVERY DAY   [DISCONTINUED] cephALEXin (KEFLEX) 500 MG capsule Take 1 capsule (500 mg total) by mouth 2 (two) times daily.   [DISCONTINUED] ciprofloxacin (CIPRO) 500 MG tablet Take 1 tablet (500 mg total) by mouth 2 (two) times daily.   No facility-administered encounter medications on file as of 10/24/2022.   Patient for blood pressure check up.  The patient does have hypertension.   Patient relates dietary measures try to minimize salt The importance of healthy diet and activity were discussed Patient relates compliance  Patient has thyroid condition.    Compliance-good compliance  Recent lab-will do labs  Any related symptoms-no negative effects    Review of Systems     Objective:   Physical Exam General-in no acute distress Eyes-no discharge Lungs-respiratory rate normal, CTA CV-no murmurs,RRR Extremities skin warm dry no edema Neuro grossly normal Behavior normal, alert        Assessment & Plan:   1. Hypokalemia Continue potassium check metabolic 7 - potassium chloride (KLOR-CON) 10 MEQ tablet; TAKE ONE TABLET (10 MEQ TOTAL) BY MOUTH DAILY.  Dispense: 90 tablet; Refill: 1  2. Essential hypertension HTN- patient  seen for follow-up regarding HTN.   Diet, medication compliance, appropriate labs and refills were completed.   Importance of keeping blood pressure under good control to lessen the risk of complications discussed Regular follow-up visits discussed  - Basic Metabolic Panel (7)  3. Hypothyroidism, unspecified type Patient was seen today regarding hypothyroidism.  Importance of healthy diet, regular physical activity was discussed.  Importance of  compliance with medication and regular checks regarding this was discussed.   - TSH + free T4  4. Hyperlipidemia, unspecified hyperlipidemia type Hyperlipidemia-importance of diet, weight control, activity, compliance with medications discussed.   Recent labs reviewed.   Any additional labs or refills ordered.   Importance of keeping under good control discussed. Regular follow-up visits discussed  - Lipid panel  5. Elevated serum creatinine Check creatinine level - ALT She relates her moods are doing well  Follow-up within approximately 6 months

## 2022-10-24 NOTE — Patient Instructions (Signed)

## 2022-10-27 DIAGNOSIS — E039 Hypothyroidism, unspecified: Secondary | ICD-10-CM | POA: Diagnosis not present

## 2022-10-27 DIAGNOSIS — R7303 Prediabetes: Secondary | ICD-10-CM | POA: Diagnosis not present

## 2022-10-27 DIAGNOSIS — R891 Abnormal level of hormones in specimens from other organs, systems and tissues: Secondary | ICD-10-CM | POA: Diagnosis not present

## 2022-10-27 DIAGNOSIS — R5382 Chronic fatigue, unspecified: Secondary | ICD-10-CM | POA: Diagnosis not present

## 2022-10-27 DIAGNOSIS — E663 Overweight: Secondary | ICD-10-CM | POA: Diagnosis not present

## 2022-11-07 ENCOUNTER — Ambulatory Visit (INDEPENDENT_AMBULATORY_CARE_PROVIDER_SITE_OTHER): Payer: Medicare HMO | Admitting: Family Medicine

## 2022-11-07 ENCOUNTER — Other Ambulatory Visit (HOSPITAL_COMMUNITY)
Admission: RE | Admit: 2022-11-07 | Discharge: 2022-11-07 | Disposition: A | Discharge status: Home / Self Care | Payer: Medicare HMO | Source: Ambulatory Visit | Attending: Family Medicine | Admitting: Family Medicine

## 2022-11-07 VITALS — BP 125/84 | HR 109 | Temp 99.1°F | Ht 61.0 in | Wt 144.4 lb

## 2022-11-07 DIAGNOSIS — R509 Fever, unspecified: Secondary | ICD-10-CM | POA: Diagnosis not present

## 2022-11-07 DIAGNOSIS — J019 Acute sinusitis, unspecified: Secondary | ICD-10-CM

## 2022-11-07 DIAGNOSIS — R21 Rash and other nonspecific skin eruption: Secondary | ICD-10-CM

## 2022-11-07 DIAGNOSIS — D692 Other nonthrombocytopenic purpura: Secondary | ICD-10-CM | POA: Diagnosis not present

## 2022-11-07 LAB — CBC WITH DIFFERENTIAL/PLATELET
Abs Immature Granulocytes: 0.03 10*3/uL (ref 0.00–0.07)
Basophils Absolute: 0 10*3/uL (ref 0.0–0.1)
Basophils Relative: 0 %
Eosinophils Absolute: 0.1 10*3/uL (ref 0.0–0.5)
Eosinophils Relative: 1 %
HCT: 33.4 % — ABNORMAL LOW (ref 36.0–46.0)
Hemoglobin: 10.7 g/dL — ABNORMAL LOW (ref 12.0–15.0)
Immature Granulocytes: 0 %
Lymphocytes Relative: 4 %
Lymphs Abs: 0.4 10*3/uL — ABNORMAL LOW (ref 0.7–4.0)
MCH: 29.2 pg (ref 26.0–34.0)
MCHC: 32 g/dL (ref 30.0–36.0)
MCV: 91 fL (ref 80.0–100.0)
Monocytes Absolute: 0.8 10*3/uL (ref 0.1–1.0)
Monocytes Relative: 10 %
Neutro Abs: 6.7 10*3/uL (ref 1.7–7.7)
Neutrophils Relative %: 85 %
Platelets: 194 10*3/uL (ref 150–400)
RBC: 3.67 MIL/uL — ABNORMAL LOW (ref 3.87–5.11)
RDW: 13.9 % (ref 11.5–15.5)
WBC: 8 10*3/uL (ref 4.0–10.5)
nRBC: 0 % (ref 0.0–0.2)

## 2022-11-07 LAB — COMPREHENSIVE METABOLIC PANEL
ALT: 15 U/L (ref 0–44)
AST: 14 U/L — ABNORMAL LOW (ref 15–41)
Albumin: 3.5 g/dL (ref 3.5–5.0)
Alkaline Phosphatase: 76 U/L (ref 38–126)
Anion gap: 8 (ref 5–15)
BUN: 12 mg/dL (ref 8–23)
CO2: 24 mmol/L (ref 22–32)
Calcium: 8.6 mg/dL — ABNORMAL LOW (ref 8.9–10.3)
Chloride: 101 mmol/L (ref 98–111)
Creatinine, Ser: 0.92 mg/dL (ref 0.44–1.00)
GFR, Estimated: >60 mL/min (ref >60)
Glucose, Bld: 133 mg/dL — ABNORMAL HIGH (ref 70–99)
Potassium: 3.9 mmol/L (ref 3.5–5.1)
Sodium: 133 mmol/L — ABNORMAL LOW (ref 135–145)
Total Bilirubin: 0.8 mg/dL (ref 0.3–1.2)
Total Protein: 7.1 g/dL (ref 6.5–8.1)

## 2022-11-07 MED ORDER — DOXYCYCLINE HYCLATE 100 MG PO TABS: 100.0000 mg | ORAL_TABLET | Freq: Two times a day (BID) | ORAL | 0 refills | Status: DC

## 2022-11-07 NOTE — Progress Notes (Signed)
   Subjective:    Patient ID: Cheryl Gordon, female    DOB: February 15, 1956, 67 y.o.   MRN: [id number]  HPI Patient arrives today with right ear pain, fever, aching  She relates she feeling bad She describes headache body aches Had some fever off-and-on past couple nights States she did have a rash around her ankles Denies seeing a rash anywhere else Initially started recall having a tick bite the later in the day when I called her about lab results does state that she had a tick bite at the end of April Patient with ear pain discomfort in the sinuses Denies postnasal drainage denies wheezing difficulty breathing denies vomiting diarrhea  She does relate an unusual rash purpura around the ankles.  She states she does not feel bad.  Body aches headache fatigue tiredness for the past several days   Review of Systems     Objective:   Physical Exam General-in no acute distress Eyes-no discharge Lungs-respiratory rate normal, CTA CV-no murmurs,RRR Extremities skin warm dry no edema Neuro grossly normal Behavior normal, alert Splotches of purpura noted around the ankles none around the palms of the hands or feet no petechiae are noted no respiratory distress  She does not appear toxic she is able to relate well blood pressure in a good range not tachypneic     Assessment & Plan:  Patient denies to chronic With appropriate along with the body aches and not feeling well I would recommend doxycycline twice daily follow-up if not improving in the next few days Lab work ordered await the results of the lab work Doxycycline will also cover sinus infection It is possible this could be a viral illness as well May need further workup depending on how patient does over the next several days Stat lab work ordered at the hospital and will follow-up on results We will follow with her closely she is going to give [place] an update on Friday how she is doing Her CBC had a normal white count normal  platelets Sodium was slightly low Liver enzymes overall look good She does not appear septic  In my discussion with the patient I told her that Doxycycline would cover for tick related disease ( including RMSF ) Should she get worse to recheck sooner She will give [place] an update later in the week We will also reach out to her on Weds to see how she is May or may not need additional lab Initially RMSF titers were not ordered due to my decision to go ahead and cover for tick related illness with Doxycycline and also titers would be after the fact

## 2022-11-08 ENCOUNTER — Telehealth: Payer: Self-pay | Admitting: Family Medicine

## 2022-11-08 ENCOUNTER — Encounter: Payer: Self-pay | Admitting: Family Medicine

## 2022-11-08 ENCOUNTER — Ambulatory Visit (INDEPENDENT_AMBULATORY_CARE_PROVIDER_SITE_OTHER): Payer: Medicare HMO | Admitting: Family Medicine

## 2022-11-08 ENCOUNTER — Encounter: Payer: Self-pay | Admitting: *Deleted

## 2022-11-08 VITALS — BP 137/84 | Temp 98.9°F | Ht 61.0 in | Wt 144.0 lb

## 2022-11-08 DIAGNOSIS — D692 Other nonthrombocytopenic purpura: Secondary | ICD-10-CM

## 2022-11-08 DIAGNOSIS — A938 Other specified arthropod-borne viral fevers: Secondary | ICD-10-CM

## 2022-11-08 MED ORDER — FLUCONAZOLE 150 MG PO TABS: ORAL_TABLET | ORAL | 3 refills | Status: DC

## 2022-11-08 NOTE — Telephone Encounter (Signed)
Will reach out to patient in the morning to get update on her illness May or may not need follow up labs on Weds

## 2022-11-08 NOTE — Telephone Encounter (Signed)
Mychart message sent to patient.

## 2022-11-08 NOTE — Telephone Encounter (Signed)
Dr Lorin Picket spoke with patient. Patient is scheduled today 11/08/22 at 3:30 pm with Dr Lorin Picket.

## 2022-11-08 NOTE — Progress Notes (Signed)
   Subjective:    Patient ID: Angela Nevin, female    DOB: Oct 13, 1955, 67 y.o.   MRN: [id number]  HPI Patient arrives for follow up on febrile illness Patient recent febrile illness She had had a history of a tick bite a few weeks before this illness She had a tick bite toward the end of April then about 2 weeks later had purpura on her lower legs then that got better then recently body aches headache ear pain not feeling good along with purpura around the lower legs We treated her with doxycycline check lab work which did show a low sodium and slightly low hemoglobin which both can go along with RMSF She feels somewhat better today she did have fever last night of 103 and bodyaches but today she states she has had no fever and minimal body aches She is tolerating the doxycycline well  Review of Systems     Objective:   Physical Exam  General-in no acute distress Eyes-no discharge Lungs-respiratory rate normal, CTA CV-no murmurs,RRR Extremities skin warm dry no edema Neuro grossly normal Behavior normal, alert       Assessment & Plan:  Tick related fever 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

## 2022-11-09 LAB — HEPATIC FUNCTION PANEL
Albumin: 4.1 g/dL (ref 3.9–4.9)
Bilirubin Total: 0.6 mg/dL (ref 0.0–1.2)

## 2022-11-09 LAB — BASIC METABOLIC PANEL
BUN/Creatinine Ratio: 11 — ABNORMAL LOW (ref 12–28)
Glucose: 103 mg/dL — ABNORMAL HIGH (ref 70–99)

## 2022-11-09 LAB — SPOTTED FEVER GROUP ANTIBODIES

## 2022-11-09 LAB — CBC WITH DIFFERENTIAL/PLATELET
Hemoglobin: 10.3 g/dL — ABNORMAL LOW (ref 11.1–15.9)
Immature Grans (Abs): 0 10*3/uL (ref 0.0–0.1)
Lymphs: 8 %
MCHC: 32.6 g/dL (ref 31.5–35.7)
MCV: 88 fL (ref 79–97)
Monocytes Absolute: 0.7 10*3/uL (ref 0.1–0.9)
Monocytes: 10 %
RBC: 3.59 x10E6/uL — ABNORMAL LOW (ref 3.77–5.28)

## 2022-11-10 ENCOUNTER — Ambulatory Visit: Payer: Medicare HMO | Admitting: Nurse Practitioner

## 2022-11-10 ENCOUNTER — Other Ambulatory Visit: Payer: Self-pay

## 2022-11-10 DIAGNOSIS — R509 Fever, unspecified: Secondary | ICD-10-CM

## 2022-11-10 NOTE — Telephone Encounter (Signed)
I did have discussion with Cheryl Gordon She is starting to do clinically better less fever.  Less chills.  No worsening of rash.  Symptomatology improving  Lab work reviewed with patient  Nurses-please order CMP, CBC patient will do this Monday or Tuesday She will continue her doxycycline twice daily-she is aware to get labs drawn on Monday  Thanks-Dr. Lorin Picket

## 2022-11-13 ENCOUNTER — Encounter: Payer: Self-pay | Admitting: Family Medicine

## 2022-11-13 DIAGNOSIS — R509 Fever, unspecified: Secondary | ICD-10-CM | POA: Diagnosis not present

## 2022-11-14 LAB — CBC WITH DIFFERENTIAL/PLATELET
Basophils Absolute: 0 10*3/uL (ref 0.0–0.2)
Basos: 1 %
EOS (ABSOLUTE): 0.3 10*3/uL (ref 0.0–0.4)
Eos: 7 %
Hematocrit: 33.8 % — ABNORMAL LOW (ref 34.0–46.6)
Hemoglobin: 10.8 g/dL — ABNORMAL LOW (ref 11.1–15.9)
Immature Grans (Abs): 0 10*3/uL (ref 0.0–0.1)
Immature Granulocytes: 0 %
Lymphocytes Absolute: 1.2 10*3/uL (ref 0.7–3.1)
Lymphs: 24 %
MCH: 28.8 pg (ref 26.6–33.0)
MCHC: 32 g/dL (ref 31.5–35.7)
MCV: 90 fL (ref 79–97)
Monocytes Absolute: 0.3 10*3/uL (ref 0.1–0.9)
Monocytes: 7 %
Neutrophils Absolute: 3 10*3/uL (ref 1.4–7.0)
Neutrophils: 61 %
Platelets: 432 10*3/uL (ref 150–450)
RBC: 3.75 x10E6/uL — ABNORMAL LOW (ref 3.77–5.28)
RDW: 13.1 % (ref 11.7–15.4)
WBC: 5 10*3/uL (ref 3.4–10.8)

## 2022-11-14 LAB — COMPREHENSIVE METABOLIC PANEL
ALT: 47 IU/L — ABNORMAL HIGH (ref 0–32)
AST: 25 IU/L (ref 0–40)
Albumin/Globulin Ratio: 1.4
Albumin: 3.8 g/dL — ABNORMAL LOW (ref 3.9–4.9)
Alkaline Phosphatase: 324 IU/L — ABNORMAL HIGH (ref 44–121)
BUN/Creatinine Ratio: 13 (ref 12–28)
BUN: 13 mg/dL (ref 8–27)
Bilirubin Total: <0.2 mg/dL (ref 0.0–1.2)
CO2: 25 mmol/L (ref 20–29)
Calcium: 9.4 mg/dL (ref 8.7–10.3)
Chloride: 100 mmol/L (ref 96–106)
Creatinine, Ser: 0.99 mg/dL (ref 0.57–1.00)
Globulin, Total: 2.7 g/dL (ref 1.5–4.5)
Glucose: 99 mg/dL (ref 70–99)
Potassium: 5.1 mmol/L (ref 3.5–5.2)
Sodium: 140 mmol/L (ref 134–144)
Total Protein: 6.5 g/dL (ref 6.0–8.5)
eGFR: 62 mL/min/{1.73_m2} (ref >59)

## 2022-11-15 LAB — BASIC METABOLIC PANEL
BUN: 11 mg/dL (ref 8–27)
CO2: 24 mmol/L (ref 20–29)
Calcium: 8.9 mg/dL (ref 8.7–10.3)
Chloride: 101 mmol/L (ref 96–106)
Creatinine, Ser: 1.03 mg/dL — ABNORMAL HIGH (ref 0.57–1.00)
Potassium: 4.5 mmol/L (ref 3.5–5.2)
Sodium: 140 mmol/L (ref 134–144)
eGFR: 59 mL/min/{1.73_m2} — ABNORMAL LOW (ref >59)

## 2022-11-15 LAB — CBC WITH DIFFERENTIAL/PLATELET
Basophils Absolute: 0 10*3/uL (ref 0.0–0.2)
Basos: 0 %
EOS (ABSOLUTE): 0.1 10*3/uL (ref 0.0–0.4)
Eos: 1 %
Hematocrit: 31.6 % — ABNORMAL LOW (ref 34.0–46.6)
Immature Granulocytes: 0 %
Lymphocytes Absolute: 0.6 10*3/uL — ABNORMAL LOW (ref 0.7–3.1)
MCH: 28.7 pg (ref 26.6–33.0)
Neutrophils Absolute: 5.5 10*3/uL (ref 1.4–7.0)
Neutrophils: 81 %
Platelets: 235 10*3/uL (ref 150–450)
RDW: 13 % (ref 11.7–15.4)
WBC: 6.8 10*3/uL (ref 3.4–10.8)

## 2022-11-15 LAB — PROTIME-INR
INR: 1 (ref 0.9–1.2)
Prothrombin Time: 10.7 s (ref 9.1–12.0)

## 2022-11-15 LAB — HEPATIC FUNCTION PANEL
ALT: 53 IU/L — ABNORMAL HIGH (ref 0–32)
AST: 83 IU/L — ABNORMAL HIGH (ref 0–40)
Alkaline Phosphatase: 192 IU/L — ABNORMAL HIGH (ref 44–121)
Bilirubin, Direct: 0.25 mg/dL (ref 0.00–0.40)
Total Protein: 6.5 g/dL (ref 6.0–8.5)

## 2022-11-15 LAB — SPOTTED FEVER GROUP ANTIBODIES
Spotted Fever Group IgG: <1:64 {titer}
Spotted Fever Group IgM: <1:64 {titer}

## 2022-11-30 ENCOUNTER — Other Ambulatory Visit: Payer: Self-pay

## 2022-11-30 DIAGNOSIS — E876 Hypokalemia: Secondary | ICD-10-CM

## 2022-11-30 DIAGNOSIS — D649 Anemia, unspecified: Secondary | ICD-10-CM

## 2022-11-30 DIAGNOSIS — I10 Essential (primary) hypertension: Secondary | ICD-10-CM

## 2022-11-30 DIAGNOSIS — R748 Abnormal levels of other serum enzymes: Secondary | ICD-10-CM

## 2022-12-08 DIAGNOSIS — I10 Essential (primary) hypertension: Secondary | ICD-10-CM | POA: Diagnosis not present

## 2022-12-08 DIAGNOSIS — D649 Anemia, unspecified: Secondary | ICD-10-CM | POA: Diagnosis not present

## 2022-12-08 DIAGNOSIS — E876 Hypokalemia: Secondary | ICD-10-CM | POA: Diagnosis not present

## 2022-12-08 DIAGNOSIS — R748 Abnormal levels of other serum enzymes: Secondary | ICD-10-CM | POA: Diagnosis not present

## 2022-12-09 LAB — HEPATIC FUNCTION PANEL
ALT: 9 IU/L (ref 0–32)
AST: 10 IU/L (ref 0–40)
Albumin: 4.3 g/dL (ref 3.9–4.9)
Alkaline Phosphatase: 116 IU/L (ref 44–121)
Bilirubin Total: 0.3 mg/dL (ref 0.0–1.2)
Bilirubin, Direct: <0.1 mg/dL (ref 0.00–0.40)
Total Protein: 6.7 g/dL (ref 6.0–8.5)

## 2022-12-09 LAB — CBC WITH DIFFERENTIAL/PLATELET
Basophils Absolute: 0.1 10*3/uL (ref 0.0–0.2)
Basos: 1 %
EOS (ABSOLUTE): 0.3 10*3/uL (ref 0.0–0.4)
Eos: 6 %
Hematocrit: 36.4 % (ref 34.0–46.6)
Hemoglobin: 11.5 g/dL (ref 11.1–15.9)
Immature Grans (Abs): 0 10*3/uL (ref 0.0–0.1)
Immature Granulocytes: 0 %
Lymphocytes Absolute: 1.1 10*3/uL (ref 0.7–3.1)
Lymphs: 20 %
MCH: 28.3 pg (ref 26.6–33.0)
MCHC: 31.6 g/dL (ref 31.5–35.7)
MCV: 90 fL (ref 79–97)
Monocytes Absolute: 0.5 10*3/uL (ref 0.1–0.9)
Monocytes: 10 %
Neutrophils Absolute: 3.4 10*3/uL (ref 1.4–7.0)
Neutrophils: 63 %
Platelets: 262 10*3/uL (ref 150–450)
RBC: 4.06 x10E6/uL (ref 3.77–5.28)
RDW: 13.8 % (ref 11.7–15.4)
WBC: 5.4 10*3/uL (ref 3.4–10.8)

## 2022-12-09 LAB — GAMMA GT: GGT: 41 IU/L (ref 0–60)

## 2022-12-09 LAB — IRON,TIBC AND FERRITIN PANEL
Ferritin: 42 ng/mL (ref 15–150)
Iron Saturation: 15 % (ref 15–55)
Iron: 40 ug/dL (ref 27–139)
Total Iron Binding Capacity: 268 ug/dL (ref 250–450)
UIBC: 228 ug/dL (ref 118–369)

## 2022-12-09 LAB — VITAMIN B12: Vitamin B-12: 752 pg/mL (ref 232–1245)

## 2022-12-15 ENCOUNTER — Ambulatory Visit (INDEPENDENT_AMBULATORY_CARE_PROVIDER_SITE_OTHER): Payer: Medicare HMO

## 2022-12-15 VITALS — Ht 61.0 in | Wt 135.0 lb

## 2022-12-15 DIAGNOSIS — Z1231 Encounter for screening mammogram for malignant neoplasm of breast: Secondary | ICD-10-CM

## 2022-12-15 DIAGNOSIS — Z0000 Encounter for general adult medical examination without abnormal findings: Secondary | ICD-10-CM

## 2022-12-15 NOTE — Patient Instructions (Addendum)
Cheryl Gordon , Thank you for taking time to come for your Medicare Wellness Visit. I appreciate your ongoing commitment to your health goals. Please review the following plan we discussed and let me know if I can assist you in the future.   These are the goals we discussed:  Goals       Exercise 3x per week (30 min per time)      Increase as tolerated.       Patient Stated      I would like to have more me time.      Patient Stated (pt-stated)      To get in better shape        This is a list of the screening recommended for you and due dates:  Health Maintenance  Topic Date Due   COVID-19 Vaccine (3 - Pfizer risk series) 10/04/2023*   Zoster (Shingles) Vaccine (1 of 2) 10/04/2023*   Medicare Annual Wellness Visit  12/15/2023   Mammogram  01/18/2024   Colon Cancer Screening  06/27/2026   DTaP/Tdap/Td vaccine (3 - Td or Tdap) 06/19/2028   Pneumonia Vaccine  Completed   DEXA scan (bone density measurement)  Completed   Hepatitis C Screening  Completed   HPV Vaccine  Aged Out   Flu Shot  Discontinued  *Topic was postponed. The date shown is not the original due date.    Advanced directives: Advance directive discussed with you today. Even though you declined this today, please call our office should you change your mind, and we can give you the proper paperwork for you to fill out. Advance care planning is a way to make decisions about medical care that fits your values in case you are ever unable to make these decisions for yourself.  Information on Advanced Care Planning can be found at [facility] Advance Health Care Directives Advance Health Care Directives ([email or web address]    Conditions/risks identified: . You have an order for:  []   2D Mammogram  [x]   3D Mammogram  []   Bone Density   []   Lung Cancer Screening  Please call for appointment:   [facility] Address: [address], [place], [place] [address] Phone: [phone]  Make sure to wear two-piece clothing.  No lotions powders or deodorants the day of the appointment Make sure to bring picture ID and insurance card.  Bring list of medications you are currently taking including any supplements.   Schedule your [place] screening mammogram through MyChart!   Log into your MyChart account.  Go to 'Visit' (or 'Appointments' if on mobile App) --> Schedule an Appointment  Under 'Select a Reason for Visit' choose the Mammogram Screening option.  Complete the pre-visit questions and select the time and place that best fits your schedule.  Managing Pain Without Opioids Opioids are strong medicines used to treat moderate to severe pain. For some people, especially those who have long-term (chronic) pain, opioids may not be the best choice for pain management due to: Side effects like nausea, constipation, and sleepiness. The risk of addiction (opioid use disorder). The longer you take opioids, the greater your risk of addiction. Pain that lasts for more than 3 months is called chronic pain. Managing chronic pain usually requires more than one approach and is often provided by a team of health care providers working together (multidisciplinary approach). Pain management may be done at a pain management center or pain clinic. How to manage pain  without the use of opioids Use non-opioid medicines Non-opioid medicines for pain may include: Over-the-counter or prescription non-steroidal anti-inflammatory drugs (NSAIDs). These may be the first medicines used for pain. They work well for muscle and bone pain, and they reduce swelling. Acetaminophen. This over-the-counter medicine may work well for milder pain but not swelling. Antidepressants. These may be used to treat chronic pain. A certain type of antidepressant (tricyclics) is often used. These medicines are given in lower doses for pain than when used for depression. Anticonvulsants. These are usually  used to treat seizures but may also reduce nerve (neuropathic) pain. Muscle relaxants. These relieve pain caused by sudden muscle tightening (spasms). You may also use a pain medicine that is applied to the skin as a patch, cream, or gel (topical analgesic), such as a numbing medicine. These may cause fewer side effects than medicines taken by mouth. Do certain therapies as directed Some therapies can help with pain management. They include: Physical therapy. You will do exercises to gain strength and flexibility. A physical therapist may teach you exercises to move and stretch parts of your body that are weak, stiff, or painful. You can learn these exercises at physical therapy visits and practice them at home. Physical therapy may also involve: Massage. Heat wraps or applying heat or cold to affected areas. Electrical signals that interrupt pain signals (transcutaneous electrical nerve stimulation, TENS). Weak lasers that reduce pain and swelling (low-level laser therapy). Signals from your body that help you learn to regulate pain (biofeedback). Occupational therapy. This helps you to learn ways to function at home and work with less pain. Recreational therapy. This involves trying new activities or hobbies, such as a physical activity or drawing. Mental health therapy, including: Cognitive behavioral therapy (CBT). This helps you learn coping skills for dealing with pain. Acceptance and commitment therapy (ACT) to change the way you think and react to pain. Relaxation therapies, including muscle relaxation exercises and mindfulness-based stress reduction. Pain management counseling. This may be individual, family, or group counseling.  Receive medical treatments Medical treatments for pain management include: Nerve block injections. These may include a pain blocker and anti-inflammatory medicines. You may have injections: Near the spine to relieve chronic back or neck pain. Into joints to  relieve back or joint pain. Into nerve areas that supply a painful area to relieve body pain. Into muscles (trigger point injections) to relieve some painful muscle conditions. A medical device placed near your spine to help block pain signals and relieve nerve pain or chronic back pain (spinal cord stimulation device). Acupuncture. Follow these instructions at home Medicines Take over-the-counter and prescription medicines only as told by your health care provider. If you are taking pain medicine, ask your health care providers about possible side effects to watch out for. Do not drive or use heavy machinery while taking prescription opioid pain medicine. Lifestyle  Do not use drugs or alcohol to reduce pain. If you drink alcohol, limit how much you have to: 0-1 drink a day for women who are not pregnant. 0-2 drinks a day for men. Know how much alcohol is in a drink. In the [place] one drink equals one 12 oz bottle of beer (355 mL), one 5 oz glass of wine (148 mL), or one 1 oz glass of hard liquor (44 mL). Do not use any products that contain nicotine or tobacco. These products include cigarettes, chewing tobacco, and vaping devices, such as e-cigarettes. If you need help quitting, ask your health care provider.  Eat a healthy diet and maintain a healthy weight. Poor diet and excess weight may make pain worse. Eat foods that are high in fiber. These include fresh fruits and vegetables, whole grains, and beans. Limit foods that are high in fat and processed sugars, such as fried and sweet foods. Exercise regularly. Exercise lowers stress and may help relieve pain. Ask your health care provider what activities and exercises are safe for you. If your health care provider approves, join an exercise class that combines movement and stress reduction. Examples include yoga and tai chi. Get enough sleep. Lack of sleep may make pain worse. Lower stress as much as possible. Practice stress reduction  techniques as told by your therapist. General instructions Work with all your pain management providers to find the treatments that work best for you. You are an important member of your pain management team. There are many things you can do to reduce pain on your own. Consider joining an online or in-person support group for people who have chronic pain. Keep all follow-up visits. This is important. Where to find more information You can find more information about managing pain without opioids from: American Academy of Pain Medicine: painmed.org Institute for Chronic Pain: instituteforchronicpain.org American Chronic Pain Association: theacpa.org Contact a health care provider if: You have side effects from pain medicine. Your pain gets worse or does not get better with treatments or home therapy. You are struggling with anxiety or depression. Summary Many types of pain can be managed without opioids. Chronic pain may respond better to pain management without opioids. Pain is best managed when you and a team of health care providers work together. Pain management without opioids may include non-opioid medicines, medical treatments, physical therapy, mental health therapy, and lifestyle changes. Tell your health care providers if your pain gets worse or is not being managed well enough. This information is not intended to replace advice given to you by your health care provider. Make sure you discuss any questions you have with your health care provider. Document Revised: 09/01/2020 Document Reviewed: 09/01/2020 Elsevier Patient Education  2024 Elsevier Inc.   Next appointment: VIRTUAL/TELEPHONE APPOINTMENT Follow up in one year for your annual wellness visit  December 21, 2023 at 11:00 am telephone visit   Preventive Care 65 Years and Older, Female Preventive care refers to lifestyle choices and visits with your health care provider that can promote health and wellness. What does  preventive care include? A yearly physical exam. This is also called an annual well check. Dental exams once or twice a year. Routine eye exams. Ask your health care provider how often you should have your eyes checked. Personal lifestyle choices, including: Daily care of your teeth and gums. Regular physical activity. Eating a healthy diet. Avoiding tobacco and drug use. Limiting alcohol use. Practicing safe sex. Taking low-dose aspirin every day. Taking vitamin and mineral supplements as recommended by your health care provider. What happens during an annual well check? The services and screenings done by your health care provider during your annual well check will depend on your age, overall health, lifestyle risk factors, and family history of disease. Counseling  Your health care provider may ask you questions about your: Alcohol use. Tobacco use. Drug use. Emotional well-being. Home and relationship well-being. Sexual activity. Eating habits. History of falls. Memory and ability to understand (cognition). Work and work Astronomer. Reproductive health. Screening  You may have the following tests or measurements: Height, weight, and BMI. Blood pressure. Lipid and  cholesterol levels. These may be checked every 5 years, or more frequently if you are over 16 years old. Skin check. Lung cancer screening. You may have this screening every year starting at age 72 if you have a 30-pack-year history of smoking and currently smoke or have quit within the past 15 years. Fecal occult blood test (FOBT) of the stool. You may have this test every year starting at age 80. Flexible sigmoidoscopy or colonoscopy. You may have a sigmoidoscopy every 5 years or a colonoscopy every 10 years starting at age 68. Hepatitis C blood test. Hepatitis B blood test. Sexually transmitted disease (STD) testing. Diabetes screening. This is done by checking your blood sugar (glucose) after you have not  eaten for a while (fasting). You may have this done every 1-3 years. Bone density scan. This is done to screen for osteoporosis. You may have this done starting at age 79. Mammogram. This may be done every 1-2 years. Talk to your health care provider about how often you should have regular mammograms. Talk with your health care provider about your test results, treatment options, and if necessary, the need for more tests. Vaccines  Your health care provider may recommend certain vaccines, such as: Influenza vaccine. This is recommended every year. Tetanus, diphtheria, and acellular pertussis (Tdap, Td) vaccine. You may need a Td booster every 10 years. Zoster vaccine. You may need this after age 76. Pneumococcal 13-valent conjugate (PCV13) vaccine. One dose is recommended after age 40. Pneumococcal polysaccharide (PPSV23) vaccine. One dose is recommended after age 4. Talk to your health care provider about which screenings and vaccines you need and how often you need them. This information is not intended to replace advice given to you by your health care provider. Make sure you discuss any questions you have with your health care provider. Document Released: 06/18/2015 Document Revised: 02/09/2016 Document Reviewed: 03/23/2015 Elsevier Interactive Patient Education  2017 [employer].  Fall Prevention in the Home Falls can cause injuries. They can happen to people of all ages. There are many things you can do to make your home safe and to help prevent falls. What can I do on the outside of my home? Regularly fix the edges of walkways and driveways and fix any cracks. Remove anything that might make you trip as you walk through a door, such as a raised step or threshold. Trim any bushes or trees on the path to your home. Use bright outdoor lighting. Clear any walking paths of anything that might make someone trip, such as rocks or tools. Regularly check to see if handrails are loose or  broken. Make sure that both sides of any steps have handrails. Any raised decks and porches should have guardrails on the edges. Have any leaves, snow, or ice cleared regularly. Use sand or salt on walking paths during winter. Clean up any spills in your garage right away. This includes oil or grease spills. What can I do in the bathroom? Use night lights. Install grab bars by the toilet and in the tub and shower. Do not use towel bars as grab bars. Use non-skid mats or decals in the tub or shower. If you need to sit down in the shower, use a plastic, non-slip stool. Keep the floor dry. Clean up any water that spills on the floor as soon as it happens. Remove soap buildup in the tub or shower regularly. Attach bath mats securely with double-sided non-slip rug tape. Do not have throw rugs and other  things on the floor that can make you trip. What can I do in the bedroom? Use night lights. Make sure that you have a light by your bed that is easy to reach. Do not use any sheets or blankets that are too big for your bed. They should not hang down onto the floor. Have a firm chair that has side arms. You can use this for support while you get dressed. Do not have throw rugs and other things on the floor that can make you trip. What can I do in the kitchen? Clean up any spills right away. Avoid walking on wet floors. Keep items that you use a lot in easy-to-reach places. If you need to reach something above you, use a strong step stool that has a grab bar. Keep electrical cords out of the way. Do not use floor polish or wax that makes floors slippery. If you must use wax, use non-skid floor wax. Do not have throw rugs and other things on the floor that can make you trip. What can I do with my stairs? Do not leave any items on the stairs. Make sure that there are handrails on both sides of the stairs and use them. Fix handrails that are broken or loose. Make sure that handrails are as long as  the stairways. Check any carpeting to make sure that it is firmly attached to the stairs. Fix any carpet that is loose or worn. Avoid having throw rugs at the top or bottom of the stairs. If you do have throw rugs, attach them to the floor with carpet tape. Make sure that you have a light switch at the top of the stairs and the bottom of the stairs. If you do not have them, ask someone to add them for you. What else can I do to help prevent falls? Wear shoes that: Do not have high heels. Have rubber bottoms. Are comfortable and fit you well. Are closed at the toe. Do not wear sandals. If you use a stepladder: Make sure that it is fully opened. Do not climb a closed stepladder. Make sure that both sides of the stepladder are locked into place. Ask someone to hold it for you, if possible. Clearly mark and make sure that you can see: Any grab bars or handrails. First and last steps. Where the edge of each step is. Use tools that help you move around (mobility aids) if they are needed. These include: Canes. Walkers. Scooters. Crutches. Turn on the lights when you go into a dark area. Replace any light bulbs as soon as they burn out. Set up your furniture so you have a clear path. Avoid moving your furniture around. If any of your floors are uneven, fix them. If there are any pets around you, be aware of where they are. Review your medicines with your doctor. Some medicines can make you feel dizzy. This can increase your chance of falling. Ask your doctor what other things that you can do to help prevent falls. This information is not intended to replace advice given to you by your health care provider. Make sure you discuss any questions you have with your health care provider. Document Released: 03/18/2009 Document Revised: 10/28/2015 Document Reviewed: 06/26/2014 Elsevier Interactive Patient Education  2017 [employer].

## 2022-12-15 NOTE — Progress Notes (Signed)
 Subjective:   Cheryl Gordon is a 67 y.o. female who presents for Medicare Annual (Subsequent) preventive examination.  Visit Complete: Virtual  I connected with  [name] on 12/15/22 by a audio enabled telemedicine application and verified that I am speaking with the correct person using two identifiers.  Patient Location: Home  Provider Location: Home Office  I discussed the limitations of evaluation and management by telemedicine. The patient expressed understanding and agreed to proceed.  Patient Medicare AWV questionnaire was completed by the patient on n/a; I have confirmed that all information answered by patient is correct and no changes since this date.  Review of Systems     Cardiac Risk Factors include: advanced age (>69men, >45 women);dyslipidemia;hypertension;sedentary lifestyle     Objective:    Today's Vitals   12/15/22 1233  Weight: 135 lb (61.2 kg)  Height: 5\' 1"  (1.549 m)   Body mass index is 25.51 kg/m.     12/15/2022   12:33 PM 11/15/2021    2:00 PM 02/20/2021    1:39 AM 02/19/2021    6:58 PM 11/09/2020    1:12 PM 08/19/2015   12:00 PM 08/17/2015   11:57 PM  Advanced Directives  Does Patient Have a Medical Advance Directive? No Yes Yes No Yes No No  Type of Special educational needs teacher of [place];Living will Living will  Living will;Healthcare Power of Attorney    Does patient want to make changes to medical advance directive?   No - Patient declined  No - Patient declined    Copy of Healthcare Power of Attorney in Chart?  No - copy requested   No - copy requested    Would patient like information on creating a medical advance directive? No - Patient declined  No - Patient declined   Yes - Educational materials given Yes - Spiritual care consult ordered    Current Medications (verified) Outpatient Encounter Medications as of 12/15/2022  Medication Sig   acetaminophen (TYLENOL) 500 MG tablet Take 1,000 mg by mouth every 6 (six) hours as  needed for moderate pain.   albuterol (VENTOLIN HFA) 108 (90 Base) MCG/ACT inhaler Inhale 2 puffs into the lungs every 4 (four) hours as needed for wheezing or shortness of breath.   amLODipine (NORVASC) 5 MG tablet TAKE ONE (1) TABLET BY MOUTH EVERY DAY   buprenorphine (SUBUTEX) 8 MG SUBL SL tablet 1 tablet under the tongue and allow to dissolve   cholecalciferol (VITAMIN D) 1000 units tablet Take 2,000 Units by mouth daily.   doxycycline (VIBRA-TABS) 100 MG tablet Take 1 tablet (100 mg total) by mouth 2 (two) times daily.   EPINEPHrine 0.3 mg/0.3 mL IJ SOAJ injection Use as directed for severe allergy   famotidine (PEPCID) 40 MG tablet 1 qd   fluconazole (DIFLUCAN) 150 MG tablet 1 now and 1 in 1 week   FLUoxetine (PROZAC) 20 MG capsule Take 1 capsule (20 mg total) by mouth daily.   fluticasone furoate-vilanterol (BREO ELLIPTA) 100-25 MCG/INH AEPB Inhale 1 puff into the lungs daily.   Garlic 1000 MG CAPS Take 2 capsules by mouth daily.   levothyroxine (SYNTHROID) 100 MCG tablet 1 qd   MULTIPLE VITAMIN PO Take 1 tablet by mouth daily at 12 noon.   potassium chloride (KLOR-CON) 10 MEQ tablet TAKE ONE TABLET (10 MEQ TOTAL) BY MOUTH DAILY.   promethazine (PHENERGAN) 25 MG tablet 1/2 to 1 qhs prn as directed   rosuvastatin (CRESTOR) 5 MG tablet TAKE ONE (1)  TABLET BY MOUTH EVERY DAY   No facility-administered encounter medications on file as of 12/15/2022.    Allergies (verified) Ace inhibitors, Mirabegron, Nsaids, Sulfa antibiotics, and Zithromax [azithromycin]   History: Past Medical History:  Diagnosis Date   Chronic pain    Dependency on pain medication (HCC)    Hyperlipidemia    Hypertension 2002   Hypothyroid    Interstitial cystitis    Migraine headache    Spina bifida (HCC)    Past Surgical History:  Procedure Laterality Date   ABDOMINAL HYSTERECTOMY     CESAREAN SECTION     x3   COLONOSCOPY     OOPHORECTOMY     Family History  Problem Relation Age of Onset    Hypertension Father    Thyroid disease Father    Hypertension Mother    Social History   Socioeconomic History   Marital status: Widowed    Spouse name: Not on file   Number of children: Not on file   Years of education: Not on file   Highest education level: Not on file  Occupational History   Not on file  Tobacco Use   Smoking status: Former   Smokeless tobacco: Never  Substance and Sexual Activity   Alcohol use: No    Alcohol/week: 0.0 standard drinks of alcohol   Drug use: No   Sexual activity: Not on file  Other Topics Concern   Not on file  Social History Narrative   Not on file   Social Determinants of Health   Financial Resource Strain: Low Risk  (12/15/2022)   Overall Financial Resource Strain (CARDIA)    Difficulty of Paying Living Expenses: Not hard at all  Food Insecurity: No Food Insecurity (12/15/2022)   Hunger Vital Sign    Worried About Running Out of Food in the Last Year: Never true    Ran Out of Food in the Last Year: Never true  Transportation Needs: No Transportation Needs (12/15/2022)   PRAPARE - Administrator, Civil Service (Medical): No    Lack of Transportation (Non-Medical): No  Physical Activity: Sufficiently Active (12/15/2022)   Exercise Vital Sign    Days of Exercise per Week: 7 days    Minutes of Exercise per Session: 30 min  Stress: No Stress Concern Present (12/15/2022)   [employer] of Occupational Health - Occupational Stress Questionnaire    Feeling of Stress : Not at all  Social Connections: Socially Integrated (12/15/2022)   Social Connection and Isolation Panel [NHANES]    Frequency of Communication with Friends and Family: More than three times a week    Frequency of Social Gatherings with Friends and Family: More than three times a week    Attends Religious Services: More than 4 times per year    Active Member of [employer] or Organizations: Yes    Attends Engineer, structural: More than 4 times per year     Marital Status: Married    Tobacco Counseling Counseling given: Yes   Clinical Intake:  Pre-visit preparation completed: Yes  Pain : No/denies pain     BMI - recorded: 25.51 Nutritional Status: BMI 25 -29 Overweight Nutritional Risks: None Diabetes: No  How often do you need to have someone help you when you read instructions, pamphlets, or other written materials from your doctor or pharmacy?: 1 - Never  Interpreter Needed?: No  Information entered by ::  Shevonne Wolf, CMA   Activities of Daily Living    12/15/2022  12:39 PM  In your present state of health, do you have any difficulty performing the following activities:  Hearing? 0  Vision? 0  Difficulty concentrating or making decisions? 0  Walking or climbing stairs? 0  Dressing or bathing? 0  Doing errands, shopping? 0  Preparing Food and eating ? N  Using the Toilet? N  In the past six months, have you accidently leaked urine? N  Do you have problems with loss of bowel control? N  Managing your Medications? N  Managing your Finances? N  Housekeeping or managing your Housekeeping? N    Patient Care Team: Babs Sciara, MD as PCP - General (Family Medicine)  Indicate any recent Medical Services you may have received from other than [facility] providers in the past year (date may be approximate).     Assessment:   This is a routine wellness examination for Cheryl Gordon.  Hearing/Vision screen Hearing Screening - Comments:: Patient denies any hearing difficulties.    Dietary issues and exercise activities discussed:     Goals Addressed               This Visit's Progress     Patient Stated (pt-stated)        To get in better shape       Depression Screen    12/15/2022   12:40 PM 12/15/2022   12:39 PM 10/24/2022    1:13 PM 10/04/2022   10:34 AM 05/18/2022   11:05 AM 04/25/2022    8:39 AM 11/15/2021    1:58 PM  PHQ 2/9 Scores  PHQ - 2 Score 0 0  0 0 0 0  PHQ- 9 Score    0     Exception  Documentation   Patient refusal        Fall Risk    12/15/2022   12:39 PM 10/24/2022    1:09 PM 10/04/2022   10:34 AM 05/18/2022   11:05 AM 04/25/2022    8:39 AM  Fall Risk   Falls in the past year? 0 1 0 0 0  Number falls in past yr: 0 0 0 0 0  Injury with Fall? 0 0 0 0 0  Risk for fall due to : No Fall Risks  No Fall Risks  No Fall Risks  Follow up Falls prevention discussed  Falls evaluation completed  Falls evaluation completed    MEDICARE RISK AT HOME:  Medicare Risk at Home - 12/15/22 1236     Any stairs in or around the home? No    If so, are there any without handrails? No    Home free of loose throw rugs in walkways, pet beds, electrical cords, etc? Yes    Adequate lighting in your home to reduce risk of falls? Yes    Life alert? No    Use of a cane, walker or w/c? No    Grab bars in the bathroom? No    Shower chair or bench in shower? No    Elevated toilet seat or a handicapped toilet? No             TIMED UP AND GO:  Was the test performed?  No    Cognitive Function:        12/15/2022   12:38 PM 11/15/2021    2:08 PM  6CIT Screen  What Year? 0 points 0 points  What month? 0 points 0 points  What time? 0 points 0 points  Count back from 20 0 points 0  points  Months in reverse 0 points 0 points  Repeat phrase 0 points 0 points  Total Score 0 points 0 points    Immunizations Immunization History  Administered Date(s) Administered   Fluad Quad(high Dose 65+) 04/25/2022   PFIZER Comirnaty(Gray Top)Covid-19 Tri-Sucrose Vaccine 05/14/2020, 06/13/2020   PNEUMOCOCCAL CONJUGATE-20 08/16/2021   Td 01/16/2002   Tdap 06/19/2018    TDAP status: Up to date  Flu Vaccine status: Up to date  Pneumococcal vaccine status: Completed during today's visit.  Covid-19 vaccine status: Information provided on how to obtain vaccines.   Qualifies for Shingles Vaccine? Yes   Zostavax completed No   Shingrix Completed?: No.    Education has been provided regarding  the importance of this vaccine. Patient has been advised to call insurance company to determine out of pocket expense if they have not yet received this vaccine. Advised may also receive vaccine at local pharmacy or Health Dept. Verbalized acceptance and understanding.  Screening Tests Health Maintenance  Topic Date Due   Medicare Annual Wellness (AWV)  11/16/2022   COVID-19 Vaccine (3 - Pfizer risk series) 10/04/2023 (Originally 07/11/2020)   Zoster Vaccines- Shingrix (1 of 2) 10/04/2023 (Originally 09/28/1973)   MAMMOGRAM  01/18/2024   Colonoscopy  06/27/2026   DTaP/Tdap/Td (3 - Td or Tdap) 06/19/2028   Pneumonia Vaccine 76+ Years old  Completed   DEXA SCAN  Completed   Hepatitis C Screening  Completed   HPV VACCINES  Aged Out   INFLUENZA VACCINE  Discontinued    Health Maintenance  Health Maintenance Due  Topic Date Due   Medicare Annual Wellness (AWV)  11/16/2022    Colorectal cancer screening: Type of screening: Colonoscopy. Completed 06/27/2021. Repeat every 5 years  Mammogram status: Ordered 12/15/2022. Pt provided with contact info and advised to call to schedule appt.   Bone Density status: Completed 11/15/21. Results reflect: Bone density results: NORMAL. Repeat every 5 years.  Lung Cancer Screening: (Low Dose CT Chest recommended if Age 82-80 years, 20 pack-year currently smoking OR have quit w/in 15years.) does not qualify.    Additional Screening:  Hepatitis C Screening: does not qualify; Completed 08/10/2021  Vision Screening: Recommended annual ophthalmology exams for early detection of glaucoma and other disorders of the eye. Is the patient up to date with their annual eye exam?  Yes  Who is the provider or what is the name of the office in which the patient attends annual eye exams? Dr. Dione Booze  Dental Screening: Recommended annual dental exams for proper oral hygiene  Diabetic Foot Exam: n/a  Community Resource Referral / Chronic Care Management: CRR required  this visit?  No   CCM required this visit?  No     Plan:     I have personally reviewed and noted the following in the patient's chart:   Medical and social history Use of alcohol, tobacco or illicit drugs  Current medications and supplements including opioid prescriptions. Patient is currently taking opioid prescriptions. Information provided to patient regarding non-opioid alternatives. Patient advised to discuss non-opioid treatment plan with their provider. Functional ability and status Nutritional status Physical activity Advanced directives List of other physicians Hospitalizations, surgeries, and ER visits in previous 12 months Vitals Screenings to include cognitive, depression, and falls Referrals and appointments  In addition, I have reviewed and discussed with patient certain preventive protocols, quality metrics, and best practice recommendations. A written personalized care plan for preventive services as well as general preventive health recommendations were provided to patient.  Per patient no change in vitals since last visit, unable to obtain new vitals due to telehealth visit.  Any vitals documented in the visit is patient reported.  Any medications not marked as taking were not mentioned by the patient (or their caregiver if applicable) when reconciling the medications.  Because this visit was a virtual/telehealth visit,  certain criteria was not obtained, such a blood pressure, CBG if patient is a diabetic, and timed up and go.     Jordan Hawks Mekaela Azizi, CMA   12/15/2022   After Visit Summary: (MyChart) Due to this being a telephonic visit, the after visit summary with patients personalized plan was offered to patient via MyChart   Nurse Notes: Mammogram ordered

## 2022-12-21 DIAGNOSIS — Z961 Presence of intraocular lens: Secondary | ICD-10-CM | POA: Diagnosis not present

## 2022-12-21 DIAGNOSIS — H538 Other visual disturbances: Secondary | ICD-10-CM | POA: Diagnosis not present

## 2022-12-26 ENCOUNTER — Ambulatory Visit: Payer: Medicare HMO | Admitting: Family Medicine

## 2022-12-26 VITALS — BP 136/78 | HR 102 | Wt 139.2 lb

## 2022-12-26 DIAGNOSIS — E785 Hyperlipidemia, unspecified: Secondary | ICD-10-CM | POA: Diagnosis not present

## 2022-12-26 DIAGNOSIS — I10 Essential (primary) hypertension: Secondary | ICD-10-CM | POA: Diagnosis not present

## 2022-12-26 DIAGNOSIS — E039 Hypothyroidism, unspecified: Secondary | ICD-10-CM

## 2022-12-26 DIAGNOSIS — N3281 Overactive bladder: Secondary | ICD-10-CM | POA: Diagnosis not present

## 2022-12-26 NOTE — Progress Notes (Signed)
   Subjective:    Patient ID: Cheryl Gordon, female    DOB: 07/03/1955, 67 y.o.   MRN: [id number]  HPI Patient arrives today for follow up for tick bite.  Patient doing much better with this Rash is gone away Pains fevers chills [place]. There is no need or reason to do antibody studies currently  She does have some swelling in the right lower leg around the ankle that she has been says been present for the past 6 weeks worse during the day not in the calf  She also has had urinary frequency and bladder irritation states in the past she has been diagnosed with interstitial cystitis.   Review of Systems     Objective:   Physical Exam Lungs clear heart regular minimal edema in the right ankle calf not swollen not tender no rash       Assessment & Plan:  Recommend knee-high compression stocking when necessary for swelling in the ankle  Also recommend referral to urogynecology for evaluation of overactive bladder versus interstitial cystitis  1. Essential hypertension Blood pressure good control continue current measures - Basic Metabolic Panel  2. Hypothyroidism, unspecified type Check lab work await results continue meds - TSH + free T4  3. Hyperlipidemia, unspecified hyperlipidemia type Healthy diet check labs - Lipid panel  4. Overactive bladder See discussion above - Ambulatory referral to Urogynecology

## 2023-01-13 DIAGNOSIS — U071 COVID-19: Secondary | ICD-10-CM | POA: Diagnosis not present

## 2023-01-13 DIAGNOSIS — Z6826 Body mass index (BMI) 26.0-26.9, adult: Secondary | ICD-10-CM | POA: Diagnosis not present

## 2023-01-13 DIAGNOSIS — I10 Essential (primary) hypertension: Secondary | ICD-10-CM | POA: Diagnosis not present

## 2023-01-13 DIAGNOSIS — E663 Overweight: Secondary | ICD-10-CM | POA: Diagnosis not present

## 2023-01-16 DIAGNOSIS — Z6826 Body mass index (BMI) 26.0-26.9, adult: Secondary | ICD-10-CM | POA: Diagnosis not present

## 2023-01-16 DIAGNOSIS — E663 Overweight: Secondary | ICD-10-CM | POA: Diagnosis not present

## 2023-01-16 DIAGNOSIS — U071 COVID-19: Secondary | ICD-10-CM | POA: Diagnosis not present

## 2023-01-16 DIAGNOSIS — J189 Pneumonia, unspecified organism: Secondary | ICD-10-CM | POA: Diagnosis not present

## 2023-01-16 DIAGNOSIS — J208 Acute bronchitis due to other specified organisms: Secondary | ICD-10-CM | POA: Diagnosis not present

## 2023-01-23 DIAGNOSIS — F1121 Opioid dependence, in remission: Secondary | ICD-10-CM | POA: Diagnosis not present

## 2023-01-23 DIAGNOSIS — F3340 Major depressive disorder, recurrent, in remission, unspecified: Secondary | ICD-10-CM | POA: Diagnosis not present

## 2023-02-03 DIAGNOSIS — E663 Overweight: Secondary | ICD-10-CM | POA: Diagnosis not present

## 2023-02-03 DIAGNOSIS — M62830 Muscle spasm of back: Secondary | ICD-10-CM | POA: Diagnosis not present

## 2023-02-03 DIAGNOSIS — R030 Elevated blood-pressure reading, without diagnosis of hypertension: Secondary | ICD-10-CM | POA: Diagnosis not present

## 2023-02-03 DIAGNOSIS — J189 Pneumonia, unspecified organism: Secondary | ICD-10-CM | POA: Diagnosis not present

## 2023-02-03 DIAGNOSIS — Z6825 Body mass index (BMI) 25.0-25.9, adult: Secondary | ICD-10-CM | POA: Diagnosis not present

## 2023-02-15 ENCOUNTER — Other Ambulatory Visit: Payer: Self-pay | Admitting: Family Medicine

## 2023-02-25 DIAGNOSIS — Z6825 Body mass index (BMI) 25.0-25.9, adult: Secondary | ICD-10-CM | POA: Diagnosis not present

## 2023-02-25 DIAGNOSIS — E663 Overweight: Secondary | ICD-10-CM | POA: Diagnosis not present

## 2023-02-25 DIAGNOSIS — R030 Elevated blood-pressure reading, without diagnosis of hypertension: Secondary | ICD-10-CM | POA: Diagnosis not present

## 2023-02-25 DIAGNOSIS — J189 Pneumonia, unspecified organism: Secondary | ICD-10-CM | POA: Diagnosis not present

## 2023-03-07 ENCOUNTER — Ambulatory Visit (HOSPITAL_COMMUNITY)
Admission: RE | Admit: 2023-03-07 | Discharge: 2023-03-07 | Disposition: A | Discharge status: Home / Self Care | Payer: Medicare HMO | Source: Ambulatory Visit | Attending: Family Medicine | Admitting: Family Medicine

## 2023-03-07 ENCOUNTER — Ambulatory Visit: Payer: Medicare HMO | Admitting: Family Medicine

## 2023-03-07 ENCOUNTER — Other Ambulatory Visit (HOSPITAL_COMMUNITY)
Admission: RE | Admit: 2023-03-07 | Discharge: 2023-03-07 | Disposition: A | Discharge status: Home / Self Care | Payer: Medicare HMO | Source: Ambulatory Visit | Attending: Family Medicine | Admitting: Family Medicine

## 2023-03-07 ENCOUNTER — Encounter: Payer: Self-pay | Admitting: Family Medicine

## 2023-03-07 VITALS — BP 138/89 | HR 119 | Temp 98.4°F | Wt 133.2 lb

## 2023-03-07 DIAGNOSIS — R059 Cough, unspecified: Secondary | ICD-10-CM | POA: Diagnosis not present

## 2023-03-07 DIAGNOSIS — J189 Pneumonia, unspecified organism: Secondary | ICD-10-CM | POA: Diagnosis not present

## 2023-03-07 DIAGNOSIS — R918 Other nonspecific abnormal finding of lung field: Secondary | ICD-10-CM | POA: Diagnosis not present

## 2023-03-07 LAB — COMPREHENSIVE METABOLIC PANEL
ALT: 41 U/L (ref 0–44)
AST: 72 U/L — ABNORMAL HIGH (ref 15–41)
Albumin: 3.5 g/dL (ref 3.5–5.0)
Alkaline Phosphatase: 200 U/L — ABNORMAL HIGH (ref 38–126)
Anion gap: 8 (ref 5–15)
BUN: 11 mg/dL (ref 8–23)
CO2: 29 mmol/L (ref 22–32)
Calcium: 9.2 mg/dL (ref 8.9–10.3)
Chloride: 100 mmol/L (ref 98–111)
Creatinine, Ser: 0.95 mg/dL (ref 0.44–1.00)
GFR, Estimated: >60 mL/min (ref >60)
Glucose, Bld: 116 mg/dL — ABNORMAL HIGH (ref 70–99)
Potassium: 4.3 mmol/L (ref 3.5–5.1)
Sodium: 137 mmol/L (ref 135–145)
Total Bilirubin: 0.3 mg/dL (ref 0.3–1.2)
Total Protein: 7.8 g/dL (ref 6.5–8.1)

## 2023-03-07 LAB — CBC WITH DIFFERENTIAL/PLATELET
Abs Immature Granulocytes: 0.02 10*3/uL (ref 0.00–0.07)
Basophils Absolute: 0 10*3/uL (ref 0.0–0.1)
Basophils Relative: 0 %
Eosinophils Absolute: 0.4 10*3/uL (ref 0.0–0.5)
Eosinophils Relative: 5 %
HCT: 35.2 % — ABNORMAL LOW (ref 36.0–46.0)
Hemoglobin: 11.1 g/dL — ABNORMAL LOW (ref 12.0–15.0)
Immature Granulocytes: 0 %
Lymphocytes Relative: 12 %
Lymphs Abs: 0.9 10*3/uL (ref 0.7–4.0)
MCH: 28.3 pg (ref 26.0–34.0)
MCHC: 31.5 g/dL (ref 30.0–36.0)
MCV: 89.8 fL (ref 80.0–100.0)
Monocytes Absolute: 0.6 10*3/uL (ref 0.1–1.0)
Monocytes Relative: 8 %
Neutro Abs: 5.9 10*3/uL (ref 1.7–7.7)
Neutrophils Relative %: 75 %
Platelets: 258 10*3/uL (ref 150–400)
RBC: 3.92 MIL/uL (ref 3.87–5.11)
RDW: 15 % (ref 11.5–15.5)
WBC: 7.8 10*3/uL (ref 4.0–10.5)
nRBC: 0 % (ref 0.0–0.2)

## 2023-03-07 MED ORDER — PSEUDOEPH-BROMPHEN-DM 30-2-10 MG/5ML PO SYRP: ORAL_SOLUTION | ORAL | 0 refills | Status: DC

## 2023-03-07 MED ORDER — DOXYCYCLINE HYCLATE 100 MG PO TABS: 100.0000 mg | ORAL_TABLET | Freq: Two times a day (BID) | ORAL | 0 refills | Status: DC

## 2023-03-07 MED ORDER — AMOXICILLIN-POT CLAVULANATE 875-125 MG PO TABS: 1.0000 | ORAL_TABLET | Freq: Two times a day (BID) | ORAL | 0 refills | Status: DC

## 2023-03-07 NOTE — Progress Notes (Signed)
Subjective:    Patient ID: Cheryl Gordon, female    DOB: [birth year], 67 y.o.   MRN: [id number]  Discussed the use of AI scribe software for clinical note transcription with the patient, who gave verbal consent to proceed.  History of Present Illness   The patient, with a recent history of COVID-19 infection, presents with recurrent chest and lower back pain. The pain, described as severe and causing difficulty in breathing and walking, started approximately a week after the patient's COVID-19 diagnosis. The pain is not constant but is exacerbated by deep breaths.  The patient initially sought care at an urgent care center, where she was diagnosed with pneumonia based on an x-ray and given a shot of penicillin and amoxicillin. The pain subsided while on the medication but returned a week after completing the course. The patient returned to urgent care and was prescribed two additional antibiotics. The pain again subsided while on the medication but has returned since completing the course.  The patient also reports a productive cough, producing green and white sputum, which has been occurring daily for the past week. She denies any recent fevers. Despite these symptoms, the patient reports a normal energy level and denies any shortness of breath with activity. The patient completed her most recent course of antibiotics yesterday.         Review of Systems     Objective:    Physical Exam   CHEST: Rales at lower lung bases. CARDIOVASCULAR: Heart rate slightly elevated, regular rhythm, no murmurs.           Assessment & Plan:  Assessment and Plan    COVID-19 with secondary bacterial pneumonia Patient initially diagnosed with COVID-19, followed by pneumonia. Multiple courses of antibiotics (Amoxicillin, Augmentin, Clarithromycin, Doxycycline) have been completed, but patient continues to experience chest pain and productive cough with green sputum. -Order CBC and kidney function  tests. -Order chest X-ray. -Review results and discuss further management plan with patient later today.  Chronic pain Intermittent chest pain, exacerbated by deep breathing. Pain is located in the left lower back and has been ongoing for several weeks. -Management plan will be determined based on the results of the chest X-ray and blood tests.     I reviewed over the lab work and the x-ray she has persistent infection We need to retreat her with additional antibiotics I would recommend combination of Augmentin twice daily for 7 days as well as doxycycline twice daily 7 days warning signs of C. difficile were discussed in detail she will do a follow-up x-ray in approximately 3 to 4 weeks she will do a follow-up visit early November

## 2023-03-08 ENCOUNTER — Other Ambulatory Visit: Payer: Self-pay

## 2023-03-08 DIAGNOSIS — J189 Pneumonia, unspecified organism: Secondary | ICD-10-CM

## 2023-03-15 ENCOUNTER — Other Ambulatory Visit: Payer: Self-pay | Admitting: Family Medicine

## 2023-03-30 ENCOUNTER — Ambulatory Visit (HOSPITAL_COMMUNITY)
Admission: RE | Admit: 2023-03-30 | Discharge: 2023-03-30 | Disposition: A | Discharge status: Home / Self Care | Payer: Medicare HMO | Source: Ambulatory Visit | Attending: Family Medicine | Admitting: Family Medicine

## 2023-03-30 ENCOUNTER — Other Ambulatory Visit (HOSPITAL_COMMUNITY)
Admission: RE | Admit: 2023-03-30 | Discharge: 2023-03-30 | Disposition: A | Discharge status: Home / Self Care | Payer: Medicare HMO | Source: Ambulatory Visit | Attending: Family Medicine | Admitting: Family Medicine

## 2023-03-30 DIAGNOSIS — E785 Hyperlipidemia, unspecified: Secondary | ICD-10-CM | POA: Diagnosis not present

## 2023-03-30 DIAGNOSIS — U071 COVID-19: Secondary | ICD-10-CM | POA: Diagnosis not present

## 2023-03-30 DIAGNOSIS — J189 Pneumonia, unspecified organism: Secondary | ICD-10-CM | POA: Insufficient documentation

## 2023-03-30 DIAGNOSIS — E039 Hypothyroidism, unspecified: Secondary | ICD-10-CM | POA: Insufficient documentation

## 2023-03-30 DIAGNOSIS — J984 Other disorders of lung: Secondary | ICD-10-CM | POA: Diagnosis not present

## 2023-03-30 DIAGNOSIS — J1282 Pneumonia due to coronavirus disease 2019: Secondary | ICD-10-CM | POA: Diagnosis not present

## 2023-03-30 LAB — CBC WITH DIFFERENTIAL/PLATELET
Abs Immature Granulocytes: 0.01 10*3/uL (ref 0.00–0.07)
Basophils Absolute: 0 10*3/uL (ref 0.0–0.1)
Basophils Relative: 1 %
Eosinophils Absolute: 0.5 10*3/uL (ref 0.0–0.5)
Eosinophils Relative: 10 %
HCT: 38.5 % (ref 36.0–46.0)
Hemoglobin: 12.1 g/dL (ref 12.0–15.0)
Immature Granulocytes: 0 %
Lymphocytes Relative: 26 %
Lymphs Abs: 1.2 10*3/uL (ref 0.7–4.0)
MCH: 28.7 pg (ref 26.0–34.0)
MCHC: 31.4 g/dL (ref 30.0–36.0)
MCV: 91.4 fL (ref 80.0–100.0)
Monocytes Absolute: 0.3 10*3/uL (ref 0.1–1.0)
Monocytes Relative: 7 %
Neutro Abs: 2.6 10*3/uL (ref 1.7–7.7)
Neutrophils Relative %: 56 %
Platelets: 252 10*3/uL (ref 150–400)
RBC: 4.21 MIL/uL (ref 3.87–5.11)
RDW: 15.8 % — ABNORMAL HIGH (ref 11.5–15.5)
WBC: 4.6 10*3/uL (ref 4.0–10.5)
nRBC: 0 % (ref 0.0–0.2)

## 2023-03-30 LAB — LIPID PANEL
Cholesterol: 162 mg/dL (ref 0–200)
HDL: 53 mg/dL (ref >40)
LDL Cholesterol: 90 mg/dL (ref 0–99)
Total CHOL/HDL Ratio: 3.1 {ratio}
Triglycerides: 94 mg/dL (ref <150)
VLDL: 19 mg/dL (ref 0–40)

## 2023-03-30 LAB — COMPREHENSIVE METABOLIC PANEL
ALT: 17 U/L (ref 0–44)
AST: 18 U/L (ref 15–41)
Albumin: 3.8 g/dL (ref 3.5–5.0)
Alkaline Phosphatase: 93 U/L (ref 38–126)
Anion gap: 9 (ref 5–15)
BUN: 15 mg/dL (ref 8–23)
CO2: 27 mmol/L (ref 22–32)
Calcium: 9.1 mg/dL (ref 8.9–10.3)
Chloride: 102 mmol/L (ref 98–111)
Creatinine, Ser: 1.08 mg/dL — ABNORMAL HIGH (ref 0.44–1.00)
GFR, Estimated: 56 mL/min — ABNORMAL LOW (ref >60)
Glucose, Bld: 124 mg/dL — ABNORMAL HIGH (ref 70–99)
Potassium: 4.3 mmol/L (ref 3.5–5.1)
Sodium: 138 mmol/L (ref 135–145)
Total Bilirubin: 0.4 mg/dL (ref 0.3–1.2)
Total Protein: 7.2 g/dL (ref 6.5–8.1)

## 2023-03-30 LAB — T4, FREE: Free T4: 1.04 ng/dL (ref 0.61–1.12)

## 2023-03-30 LAB — TSH: TSH: 4.007 u[IU]/mL (ref 0.350–4.500)

## 2023-04-06 ENCOUNTER — Encounter: Payer: Self-pay | Admitting: Family Medicine

## 2023-04-06 ENCOUNTER — Ambulatory Visit (INDEPENDENT_AMBULATORY_CARE_PROVIDER_SITE_OTHER): Payer: Medicare HMO | Admitting: Family Medicine

## 2023-04-06 VITALS — BP 120/86 | HR 97 | Temp 98.2°F | Ht 61.0 in | Wt 137.8 lb

## 2023-04-06 DIAGNOSIS — J189 Pneumonia, unspecified organism: Secondary | ICD-10-CM | POA: Diagnosis not present

## 2023-04-06 DIAGNOSIS — F3340 Major depressive disorder, recurrent, in remission, unspecified: Secondary | ICD-10-CM | POA: Diagnosis not present

## 2023-04-06 DIAGNOSIS — F1121 Opioid dependence, in remission: Secondary | ICD-10-CM | POA: Diagnosis not present

## 2023-04-06 NOTE — Progress Notes (Signed)
What  Subjective:    Patient ID: Cheryl Gordon, female    DOB: 28-May-1956, 67 y.o.   MRN: [id number]  Discussed the use of AI scribe software for clinical note transcription with the patient, who gave verbal consent to proceed.  History of Present Illness   The patient, with a history of respiratory infection, reports no current symptoms of fever, chills, or sweats. She notes a significant improvement in coughing, which is now minimal. The patient's appetite is satisfactory. She expresses relief that the pain she previously experienced after completing antibiotic treatment did not recur this time.  The patient also mentions a recent chest x-ray, the results of which are pending due to a delay at the radiology department. She recalls that her previous x-ray showed signs of infection. The patient is awaiting the radiologist's report to confirm the current status of her condition.  The patient also reports elevated blood pressure readings during clinic visits, which she attributes to anxiety. She expresses a desire to monitor her blood pressure at home and is considering purchasing a home blood pressure monitor. She acknowledges the need for regular exercise and dietary modifications, particularly reducing salt intake, to manage her blood pressure.  The patient also mentions using a step tracker on her phone, which often records around 8000 steps per day. She understands the need for continuous, moderate-intensity exercise, such as walking, for blood pressure management.  The patient is also dealing with some family issues, which she does not elaborate on. She has not yet received her annual flu shot but plans to do so.         Review of Systems     Objective:    Physical Exam   VITALS: BP- 120/86 CHEST: lungs clear to auscultation CARDIOVASCULAR: normal heart sounds   Lungs are clear no sign of pneumonia       Assessment & Plan:  Assessment and Plan    Pulmonary  Infection Improvement in symptoms and clear lung fields on recent x-ray compared to previous imaging. Awaiting final radiologist report for confirmation. -If radiologist report confirms clearance of infection, no further action required. -If radiologist report suggests any residual infection, recommend CT scan for further evaluation.  Hypertension Elevated blood pressure noted during visit. Patient is active but does not engage in continuous exercise. -Encourage 20-25 minutes of continuous walking 4 times a week. -Advise reduction in dietary salt intake.  General Health Maintenance -Advise patient to receive flu shot today. -Check blood pressure at home occasionally, but avoid frequent checks to prevent stress.

## 2023-04-21 ENCOUNTER — Encounter: Payer: Self-pay | Admitting: Family Medicine

## 2023-04-26 DIAGNOSIS — L658 Other specified nonscarring hair loss: Secondary | ICD-10-CM | POA: Diagnosis not present

## 2023-04-26 DIAGNOSIS — R531 Weakness: Secondary | ICD-10-CM | POA: Diagnosis not present

## 2023-04-26 DIAGNOSIS — R5382 Chronic fatigue, unspecified: Secondary | ICD-10-CM | POA: Diagnosis not present

## 2023-05-01 ENCOUNTER — Ambulatory Visit: Payer: Medicare HMO | Admitting: Family Medicine

## 2023-05-02 ENCOUNTER — Other Ambulatory Visit: Payer: Self-pay | Admitting: Family Medicine

## 2023-05-02 DIAGNOSIS — E876 Hypokalemia: Secondary | ICD-10-CM

## 2023-05-04 DIAGNOSIS — F3340 Major depressive disorder, recurrent, in remission, unspecified: Secondary | ICD-10-CM | POA: Diagnosis not present

## 2023-05-04 DIAGNOSIS — F1121 Opioid dependence, in remission: Secondary | ICD-10-CM | POA: Diagnosis not present

## 2023-05-11 ENCOUNTER — Other Ambulatory Visit: Payer: Self-pay | Admitting: Family Medicine

## 2023-05-23 ENCOUNTER — Ambulatory Visit: Payer: Medicare HMO | Admitting: Family Medicine

## 2023-05-23 VITALS — BP 132/82 | HR 91 | Temp 98.7°F | Ht 61.0 in | Wt 137.4 lb

## 2023-05-23 DIAGNOSIS — E785 Hyperlipidemia, unspecified: Secondary | ICD-10-CM | POA: Diagnosis not present

## 2023-05-23 DIAGNOSIS — I10 Essential (primary) hypertension: Secondary | ICD-10-CM | POA: Diagnosis not present

## 2023-05-23 DIAGNOSIS — E039 Hypothyroidism, unspecified: Secondary | ICD-10-CM | POA: Diagnosis not present

## 2023-05-23 DIAGNOSIS — M25551 Pain in right hip: Secondary | ICD-10-CM | POA: Diagnosis not present

## 2023-05-23 MED ORDER — PREDNISONE 20 MG PO TABS: ORAL_TABLET | ORAL | 0 refills | Status: DC

## 2023-05-23 MED ORDER — ROSUVASTATIN CALCIUM 5 MG PO TABS: ORAL_TABLET | ORAL | 1 refills | Status: DC

## 2023-05-23 NOTE — Progress Notes (Signed)
   Subjective:    Patient ID: Cheryl Gordon, female    DOB: [birth year], 67 y.o.   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 She reports having walked approximately 18,000 steps in a single day during the trip, which she believes may have exacerbated the hip discomfort. The pain is localized to the right hip, initially presenting in the front and later shifting to the back. The discomfort does not radiate and is most noticeable upon initial movement after periods of rest, such as getting up from a seated position or starting a walk. The pain becomes tolerable with continued movement but worsens with prolonged activity. The patient has been managing the pain with Tylenol and lidocaine patches, which provide some relief.  The patient also reports a fall in her backyard a while back, during which she landed on the right side. However, she does not recall any immediate or lasting discomfort from the fall. She has a history of medication allergies to anti-inflammatories. The patient has been trying to maintain an active lifestyle despite the pain, including regular walks and caring for her grandchildren three to four days a week. She notes that the physical demands of caregiving can be tiring and has considered enrolling the children in a part-time program to alleviate some of the physical strain.         Review of Systems     Objective:    Physical Exam   VITALS: BP- 132/82 EXTREMITIES: Limp on right side. Favoring right side during ambulation. MUSCULOSKELETAL: No tenderness or discomfort on manipulation of left leg. No discomfort on flexion, rotation, or extension of right hip.       Lab work from previous blood draw in October reviewed in detail    Assessment & Plan:  Assessment and Plan    Right Hip Pain   Pain localized  to the right hip, exacerbated by walking and standing. No radiation of pain. Possible arthritis.   -Order hip X-ray to assess for arthritis.   -Start Prednisone 20mg , 2 tablets daily for 4 days, then 1 tablet daily for 4 days.   -Advise patient to report back in 7-10 days regarding response to medication.    General Health Maintenance   -Continue monitoring blood pressure, liver enzymes, sodium, potassium, kidney function, hemoglobin, white blood cell count, thyroid function, and cholesterol profile as she is currently within normal limits.   -Encourage patient to maintain physical activity as tolerated.     1. Right hip pain (Primary) I am concerned about the possibility of hip arthritis recommend x-ray await the results may need orthopedic referral if prednisone does not help or if moderate to severe arthritis noted on x-ray - DG Hip Unilat W OR W/O Pelvis Min 4 Views Right  2. Essential hypertension Blood pressure good control continue current measures  3. Hypothyroidism, unspecified type Continue thyroid medicine healthy diet  4. Hyperlipidemia, unspecified hyperlipidemia type Continue statin healthy diet  Patient did labs in October we reviewed these in detail

## 2023-06-05 DIAGNOSIS — F1121 Opioid dependence, in remission: Secondary | ICD-10-CM | POA: Diagnosis not present

## 2023-06-05 DIAGNOSIS — F3340 Major depressive disorder, recurrent, in remission, unspecified: Secondary | ICD-10-CM | POA: Diagnosis not present

## 2023-06-20 DIAGNOSIS — Z1231 Encounter for screening mammogram for malignant neoplasm of breast: Secondary | ICD-10-CM | POA: Diagnosis not present

## 2023-06-20 DIAGNOSIS — N958 Other specified menopausal and perimenopausal disorders: Secondary | ICD-10-CM | POA: Diagnosis not present

## 2023-06-20 DIAGNOSIS — Z13820 Encounter for screening for osteoporosis: Secondary | ICD-10-CM | POA: Diagnosis not present

## 2023-06-20 DIAGNOSIS — Z6826 Body mass index (BMI) 26.0-26.9, adult: Secondary | ICD-10-CM | POA: Diagnosis not present

## 2023-06-20 DIAGNOSIS — Z01419 Encounter for gynecological examination (general) (routine) without abnormal findings: Secondary | ICD-10-CM | POA: Diagnosis not present

## 2023-06-20 LAB — HM MAMMOGRAPHY

## 2023-07-03 DIAGNOSIS — F3340 Major depressive disorder, recurrent, in remission, unspecified: Secondary | ICD-10-CM | POA: Diagnosis not present

## 2023-07-03 DIAGNOSIS — F1121 Opioid dependence, in remission: Secondary | ICD-10-CM | POA: Diagnosis not present

## 2023-07-31 DIAGNOSIS — F3340 Major depressive disorder, recurrent, in remission, unspecified: Secondary | ICD-10-CM | POA: Diagnosis not present

## 2023-07-31 DIAGNOSIS — F1121 Opioid dependence, in remission: Secondary | ICD-10-CM | POA: Diagnosis not present

## 2023-08-03 DIAGNOSIS — E663 Overweight: Secondary | ICD-10-CM | POA: Diagnosis not present

## 2023-08-03 DIAGNOSIS — R5382 Chronic fatigue, unspecified: Secondary | ICD-10-CM | POA: Diagnosis not present

## 2023-08-08 DIAGNOSIS — E039 Hypothyroidism, unspecified: Secondary | ICD-10-CM | POA: Diagnosis not present

## 2023-08-08 DIAGNOSIS — E785 Hyperlipidemia, unspecified: Secondary | ICD-10-CM | POA: Diagnosis not present

## 2023-08-08 DIAGNOSIS — K219 Gastro-esophageal reflux disease without esophagitis: Secondary | ICD-10-CM | POA: Diagnosis not present

## 2023-08-08 DIAGNOSIS — G8929 Other chronic pain: Secondary | ICD-10-CM | POA: Diagnosis not present

## 2023-08-08 DIAGNOSIS — Z87891 Personal history of nicotine dependence: Secondary | ICD-10-CM | POA: Diagnosis not present

## 2023-08-08 DIAGNOSIS — F419 Anxiety disorder, unspecified: Secondary | ICD-10-CM | POA: Diagnosis not present

## 2023-08-08 DIAGNOSIS — M8580 Other specified disorders of bone density and structure, unspecified site: Secondary | ICD-10-CM | POA: Diagnosis not present

## 2023-08-08 DIAGNOSIS — E876 Hypokalemia: Secondary | ICD-10-CM | POA: Diagnosis not present

## 2023-08-08 DIAGNOSIS — I10 Essential (primary) hypertension: Secondary | ICD-10-CM | POA: Diagnosis not present

## 2023-08-08 DIAGNOSIS — E663 Overweight: Secondary | ICD-10-CM | POA: Diagnosis not present

## 2023-08-08 DIAGNOSIS — E559 Vitamin D deficiency, unspecified: Secondary | ICD-10-CM | POA: Diagnosis not present

## 2023-08-08 DIAGNOSIS — F3342 Major depressive disorder, recurrent, in full remission: Secondary | ICD-10-CM | POA: Diagnosis not present

## 2023-08-27 DIAGNOSIS — F1121 Opioid dependence, in remission: Secondary | ICD-10-CM | POA: Diagnosis not present

## 2023-08-27 DIAGNOSIS — F3340 Major depressive disorder, recurrent, in remission, unspecified: Secondary | ICD-10-CM | POA: Diagnosis not present

## 2023-09-24 DIAGNOSIS — F3340 Major depressive disorder, recurrent, in remission, unspecified: Secondary | ICD-10-CM | POA: Diagnosis not present

## 2023-09-24 DIAGNOSIS — F1121 Opioid dependence, in remission: Secondary | ICD-10-CM | POA: Diagnosis not present

## 2023-10-11 ENCOUNTER — Other Ambulatory Visit: Payer: Self-pay | Admitting: Family Medicine

## 2023-10-11 DIAGNOSIS — E876 Hypokalemia: Secondary | ICD-10-CM

## 2023-10-23 DIAGNOSIS — F1121 Opioid dependence, in remission: Secondary | ICD-10-CM | POA: Diagnosis not present

## 2023-10-23 DIAGNOSIS — F3340 Major depressive disorder, recurrent, in remission, unspecified: Secondary | ICD-10-CM | POA: Diagnosis not present

## 2023-11-08 ENCOUNTER — Other Ambulatory Visit: Payer: Self-pay | Admitting: Family Medicine

## 2023-11-21 ENCOUNTER — Ambulatory Visit: Payer: Medicare HMO | Admitting: Family Medicine

## 2023-12-03 ENCOUNTER — Ambulatory Visit: Admitting: Family Medicine

## 2023-12-03 ENCOUNTER — Encounter: Payer: Self-pay | Admitting: Family Medicine

## 2023-12-03 VITALS — BP 136/83 | Temp 97.9°F | Ht 61.0 in | Wt 141.0 lb

## 2023-12-03 DIAGNOSIS — E039 Hypothyroidism, unspecified: Secondary | ICD-10-CM | POA: Diagnosis not present

## 2023-12-03 DIAGNOSIS — E785 Hyperlipidemia, unspecified: Secondary | ICD-10-CM | POA: Diagnosis not present

## 2023-12-03 DIAGNOSIS — I10 Essential (primary) hypertension: Secondary | ICD-10-CM

## 2023-12-03 DIAGNOSIS — H6122 Impacted cerumen, left ear: Secondary | ICD-10-CM

## 2023-12-03 MED ORDER — LEVOTHYROXINE SODIUM 100 MCG PO TABS: ORAL_TABLET | ORAL | 2 refills | Status: AC

## 2023-12-03 MED ORDER — FLUOXETINE HCL 20 MG PO CAPS: 20.0000 mg | ORAL_CAPSULE | Freq: Every day | ORAL | 2 refills | Status: DC

## 2023-12-03 NOTE — Progress Notes (Signed)
 Subjective:    Patient ID: Cheryl Gordon, female    DOB: 11-Mar-1956, 68 y.o.   MRN: [id number]  HPI  6 month follow up and left ear is muffled from the pool  Takes her blood pressure medicine regular basis under good control with this tries to watch her diet Takes her acid blocker regular basis no reflux issues Stress levels are reasonable with the Prozac  Having some family stress issues with distant stepchildren in regards to not having great relationship with the daughter-in-law but patient is trying to work on this  Discussed the use of AI scribe software for clinical note transcription with the patient, who gave verbal consent to proceed. Patient for blood pressure check up.  The patient does have hypertension.   Patient relates dietary measures try to minimize salt The importance of healthy diet and activity were discussed Patient relates compliance  Patient here for follow-up regarding cholesterol.    Patient relates taking medication on a regular basis Denies problems with medication Importance of dietary measures discussed Regular lab work regarding lipid and liver was checked and if needing additional labs was appropriately ordered Denies being depressed  Review of Systems     Objective:   Physical Exam General-in no acute distress Eyes-no discharge Lungs-respiratory rate normal, CTA CV-no murmurs,RRR Extremities skin warm dry no edema Neuro grossly normal Behavior normal, alert  Cerumen impaction left ear patient states she cannot hear she would like relief Results for orders placed or performed during the hospital encounter of 03/30/23  TSH   Collection Time: 03/30/23  2:15 PM  Result Value Ref Range   TSH 4.007 0.350 - 4.500 uIU/mL  T4, free   Collection Time: 03/30/23  2:15 PM  Result Value Ref Range   Free T4 1.04 0.61 - 1.12 ng/dL  Lipid panel   Collection Time: 03/30/23  2:15 PM  Result Value Ref Range   Cholesterol 162 0 - 200 mg/dL    Triglycerides 94 <849 mg/dL   HDL 53 >59 mg/dL   Total CHOL/HDL Ratio 3.1 RATIO   VLDL 19 0 - 40 mg/dL   LDL Cholesterol 90 0 - 99 mg/dL  CBC with Differential/Platelet   Collection Time: 03/30/23  2:16 PM  Result Value Ref Range   WBC 4.6 4.0 - 10.5 K/uL   RBC 4.21 3.87 - 5.11 MIL/uL   Hemoglobin 12.1 12.0 - 15.0 g/dL   HCT 61.4 63.9 - 53.9 %   MCV 91.4 80.0 - 100.0 fL   MCH 28.7 26.0 - 34.0 pg   MCHC 31.4 30.0 - 36.0 g/dL   RDW 84.1 (H) 88.4 - 84.4 %   Platelets 252 150 - 400 K/uL   nRBC 0.0 0.0 - 0.2 %   Neutrophils Relative % 56 %   Neutro Abs 2.6 1.7 - 7.7 K/uL   Lymphocytes Relative 26 %   Lymphs Abs 1.2 0.7 - 4.0 K/uL   Monocytes Relative 7 %   Monocytes Absolute 0.3 0.1 - 1.0 K/uL   Eosinophils Relative 10 %   Eosinophils Absolute 0.5 0.0 - 0.5 K/uL   Basophils Relative 1 %   Basophils Absolute 0.0 0.0 - 0.1 K/uL   Immature Granulocytes 0 %   Abs Immature Granulocytes 0.01 0.00 - 0.07 K/uL  Comprehensive metabolic panel   Collection Time: 03/30/23  2:16 PM  Result Value Ref Range   Sodium 138 135 - 145 mmol/L   Potassium 4.3 3.5 - 5.1 mmol/L   Chloride 102 98 -  111 mmol/L   CO2 27 22 - 32 mmol/L   Glucose, Bld 124 (H) 70 - 99 mg/dL   BUN 15 8 - 23 mg/dL   Creatinine, Ser 8.91 (H) 0.44 - 1.00 mg/dL   Calcium  9.1 8.9 - 10.3 mg/dL   Total Protein 7.2 6.5 - 8.1 g/dL   Albumin 3.8 3.5 - 5.0 g/dL   AST 18 15 - 41 U/L   ALT 17 0 - 44 U/L   Alkaline Phosphatase 93 38 - 126 U/L   Total Bilirubin 0.4 0.3 - 1.2 mg/dL   GFR, Estimated 56 (L) >60 mL/min   Anion gap 9 5 - 15        Assessment & Plan:   1. Impacted cerumen of left ear (Primary) Referral to ENT for further evaluation will need irrigation or possible microscopy to remove this  2. Essential hypertension Blood pressure good control continue current measures  3. Hypothyroidism, unspecified type Thyroid  good control continue current measures.  4. Hyperlipidemia, unspecified hyperlipidemia  type Continue statin.  Healthy diet. Patient to be rechecked lab work wise later this fall we will send her labs with a reminder

## 2023-12-04 ENCOUNTER — Other Ambulatory Visit: Payer: Self-pay

## 2023-12-04 DIAGNOSIS — H6122 Impacted cerumen, left ear: Secondary | ICD-10-CM

## 2023-12-05 ENCOUNTER — Other Ambulatory Visit: Payer: Self-pay | Admitting: Family Medicine

## 2023-12-21 ENCOUNTER — Ambulatory Visit: Payer: Medicare HMO

## 2023-12-28 ENCOUNTER — Ambulatory Visit (INDEPENDENT_AMBULATORY_CARE_PROVIDER_SITE_OTHER)

## 2023-12-28 VITALS — Ht 61.0 in | Wt 141.0 lb

## 2023-12-28 DIAGNOSIS — Z0000 Encounter for general adult medical examination without abnormal findings: Secondary | ICD-10-CM | POA: Diagnosis not present

## 2023-12-28 NOTE — Progress Notes (Signed)
 Subjective:   Cheryl Gordon is a 68 y.o. who presents for a Medicare Wellness preventive visit.  As a reminder, Annual Wellness Visits don't include a physical exam, and some assessments may be limited, especially if this visit is performed virtually. We may recommend an in-person follow-up visit with your provider if needed.  Visit Complete: Virtual I connected with  Cheryl Gordon on 12/28/23 by a audio enabled telemedicine application and verified that I am speaking with the correct person using two identifiers.  Patient Location: Home  Provider Location: Home Office  I discussed the limitations of evaluation and management by telemedicine. The patient expressed understanding and agreed to proceed.  Vital Signs: Because this visit was a virtual/telehealth visit, some criteria may be missing or patient reported. Any vitals not documented were not able to be obtained and vitals that have been documented are patient reported.  VideoDeclined- This patient declined Librarian, academic. Therefore the visit was completed with audio only.  Persons Participating in Visit: Patient.  AWV Questionnaire: No: Patient Medicare AWV questionnaire was not completed prior to this visit.  Cardiac Risk Factors include: advanced age (>16men, >42 women);dyslipidemia;hypertension     Objective:    Today's Vitals   12/28/23 1043  Weight: 141 lb (64 kg)  Height: 5' 1 (1.549 m)   Body mass index is 26.64 kg/m.     12/28/2023   10:48 AM 12/15/2022   12:33 PM 11/15/2021    2:00 PM 02/20/2021    1:39 AM 02/19/2021    6:58 PM 11/09/2020    1:12 PM 08/19/2015   12:00 PM  Advanced Directives  Does Patient Have a Medical Advance Directive? No No Yes Yes No Yes No   Type of Best boy of [place];Living will Living will  Living will;Healthcare Power of Attorney   Does patient want to make changes to medical advance directive?    No - Patient declined   No - Patient declined   Copy of Healthcare Power of Attorney in Chart?   No - copy requested   No - copy requested   Would patient like information on creating a medical advance directive? Yes (MAU/Ambulatory/Procedural Areas - Information given) No - Patient declined  No - Patient declined   Yes - Educational materials given      Data saved with a previous flowsheet row definition    Current Medications (verified) Outpatient Encounter Medications as of 12/28/2023  Medication Sig   acetaminophen  (TYLENOL ) 500 MG tablet Take 1,000 mg by mouth every 6 (six) hours as needed for moderate pain.   amLODipine  (NORVASC ) 5 MG tablet TAKE ONE (1) TABLET BY MOUTH EVERY DAY   buprenorphine  (SUBUTEX ) 8 MG SUBL SL tablet 1 tablet under the tongue and allow to dissolve   cholecalciferol (VITAMIN D) 1000 units tablet Take 2,000 Units by mouth daily.   EPINEPHrine  0.3 mg/0.3 mL IJ SOAJ injection Use as directed for severe allergy   famotidine  (PEPCID ) 40 MG tablet TAKE ONE (1) TABLET BY MOUTH EVERY DAY   FLUoxetine  (PROZAC ) 20 MG capsule Take 1 capsule (20 mg total) by mouth daily.   Garlic 1000 MG CAPS Take 2 capsules by mouth daily.   levothyroxine  (SYNTHROID ) 100 MCG tablet 1 qd   MULTIPLE VITAMIN PO Take 1 tablet by mouth daily at 12 noon.   potassium chloride  (KLOR-CON ) 10 MEQ tablet TAKE ONE TABLET ( TOTAL) BY MOUTH DAILY   promethazine  (PHENERGAN ) 25 MG tablet TAKE  ONE-HALF TO ONE TABLET BY MOUTH AT BEDTIME AS NEEDED AS DIRECTED   rosuvastatin  (CRESTOR ) 5 MG tablet TAKE ONE (1) TABLET BY MOUTH EVERY DAY   No facility-administered encounter medications on file as of 12/28/2023.    Allergies (verified) Ace inhibitors, Mirabegron, Nsaids, Sulfa antibiotics, and Zithromax [azithromycin]   History: Past Medical History:  Diagnosis Date   Chronic pain    Dependency on pain medication (HCC)    Hyperlipidemia    Hypertension 2002   Hypothyroid    Interstitial cystitis    Migraine headache     Spina bifida (HCC)    Past Surgical History:  Procedure Laterality Date   ABDOMINAL HYSTERECTOMY     CESAREAN SECTION     x3   COLONOSCOPY     OOPHORECTOMY     Family History  Problem Relation Age of Onset   Hypertension Father    Thyroid  disease Father    Hypertension Mother    Social History   Socioeconomic History   Marital status: Widowed    Spouse name: Not on file   Number of children: Not on file   Years of education: Not on file   Highest education level: Not on file  Occupational History   Not on file  Tobacco Use   Smoking status: Former   Smokeless tobacco: Never  Substance and Sexual Activity   Alcohol use: No    Alcohol/week: 0.0 standard drinks of alcohol   Drug use: No   Sexual activity: Not on file  Other Topics Concern   Not on file  Social History Narrative   Not on file   Social Drivers of Health   Financial Resource Strain: Low Risk  (12/28/2023)   Overall Financial Resource Strain (CARDIA)    Difficulty of Paying Living Expenses: Not hard at all  Food Insecurity: No Food Insecurity (12/28/2023)   Hunger Vital Sign    Worried About Running Out of Food in the Last Year: Never true    Ran Out of Food in the Last Year: Never true  Transportation Needs: No Transportation Needs (12/28/2023)   PRAPARE - Administrator, Civil Service (Medical): No    Lack of Transportation (Non-Medical): No  Physical Activity: Sufficiently Active (12/28/2023)   Exercise Vital Sign    Days of Exercise per Week: 5 days    Minutes of Exercise per Session: 30 min  Stress: No Stress Concern Present (12/28/2023)   [employer] of Occupational Health - Occupational Stress Questionnaire    Feeling of Stress: Not at all  Social Connections: Socially Integrated (12/28/2023)   Social Connection and Isolation Panel    Frequency of Communication with Friends and Family: More than three times a week    Frequency of Social Gatherings with Friends and Family:  Three times a week    Attends Religious Services: More than 4 times per year    Active Member of Clubs or Organizations: Yes    Attends Banker Meetings: More than 4 times per year    Marital Status: Married    Tobacco Counseling Counseling given: Not Answered    Clinical Intake:  Pre-visit preparation completed: Yes  Pain : No/denies pain  Diabetes: No  Lab Results  Component Value Date   HGBA1C 5.3 02/17/2016     How often do you need to have someone help you when you read instructions, pamphlets, or other written materials from your doctor or pharmacy?: 1 - Never  Interpreter Needed?: No  Information entered by :: Charmaine Bloodgood LPN   Activities of Daily Living     12/28/2023   10:47 AM  In your present state of health, do you have any difficulty performing the following activities:  Hearing? 0  Vision? 0  Difficulty concentrating or making decisions? 0  Walking or climbing stairs? 0  Dressing or bathing? 0  Doing errands, shopping? 0  Preparing Food and eating ? N  Using the Toilet? N  In the past six months, have you accidently leaked urine? N  Do you have problems with loss of bowel control? N  Managing your Medications? N  Managing your Finances? N  Housekeeping or managing your Housekeeping? N    Patient Care Team: Alphonsa Glendia LABOR, MD as PCP - General (Family Medicine) [facility] Marget Lenis, MD as Consulting Physician (Obstetrics and Gynecology) Amos Carole Mad, MD (Psychiatry) [facility]  I have updated your Care Teams any recent Medical Services you may have received from other providers in the past year.     Assessment:   This is a routine wellness examination for Cheryl Gordon.  Hearing/Vision screen Hearing Screening - Comments:: Denies hearing difficulties   Vision Screening - Comments:: Wears rx glasses - up to date with routine eye exams with [facility]    Goals  Addressed             This Visit's Progress    Patient Stated   On track    I would like to have more me time.       Depression Screen     12/28/2023   10:45 AM 12/03/2023    3:23 PM 05/23/2023   10:27 AM 04/06/2023   11:38 AM 12/26/2022    4:02 PM 12/15/2022   12:40 PM 12/15/2022   12:39 PM  PHQ 2/9 Scores  PHQ - 2 Score 2 2 1  0  0 0  PHQ- 9 Score 2 2 3 1      Exception Documentation     Patient refusal      Fall Risk     12/28/2023   10:46 AM 05/23/2023   10:26 AM 04/06/2023   11:38 AM 12/26/2022    4:02 PM 12/15/2022   12:39 PM  Fall Risk   Falls in the past year? 0 1 0 0 0  Number falls in past yr: 0 0  0 0  Injury with Fall? 0 0  0 0  Risk for fall due to : No Fall Risks    No Fall Risks  Follow up Falls prevention discussed;Education provided;Falls evaluation completed    Falls prevention discussed    MEDICARE RISK AT HOME:  Medicare Risk at Home Any stairs in or around the home?: No If so, are there any without handrails?: No Home free of loose throw rugs in walkways, pet beds, electrical cords, etc?: Yes Adequate lighting in your home to reduce risk of falls?: Yes Life alert?: No Use of a cane, walker or w/c?: No Grab bars in the bathroom?: Yes Shower chair or bench in shower?: No Elevated toilet seat or a handicapped toilet?: Yes  TIMED UP AND GO:  Was the test performed?  No  Cognitive Function: Declined/Normal: No cognitive concerns noted by patient or family. Patient alert, oriented, able to answer questions appropriately and recall recent events. No signs of memory loss or confusion.        12/15/2022   12:38 PM 11/15/2021    2:08  PM  6CIT Screen  What Year? 0 points 0 points  What month? 0 points 0 points  What time? 0 points 0 points  Count back from 20 0 points 0 points  Months in reverse 0 points 0 points  Repeat phrase 0 points 0 points  Total Score 0 points 0 points    Immunizations Immunization History  Administered Date(s)  Administered   Fluad Quad(high Dose 65+) 04/25/2022   Influenza-Unspecified 05/12/2023   PFIZER Comirnaty(Gray Top)Covid-19 Tri-Sucrose Vaccine 05/14/2020, 06/13/2020   PNEUMOCOCCAL CONJUGATE-20 08/16/2021   Td 01/16/2002   Tdap 06/19/2018    Screening Tests Health Maintenance  Topic Date Due   Zoster Vaccines- Shingrix (1 of 2) Never done   COVID-19 Vaccine (3 - Pfizer risk series) 07/11/2020   INFLUENZA VACCINE  01/04/2024   MAMMOGRAM  01/18/2024   Medicare Annual Wellness (AWV)  12/27/2024   Colonoscopy  06/27/2026   DTaP/Tdap/Td (3 - Td or Tdap) 06/19/2028   Pneumococcal Vaccine: 50+ Years  Completed   DEXA SCAN  Completed   Hepatitis C Screening  Completed   Hepatitis B Vaccines  Aged Out   HPV VACCINES  Aged Out   Meningococcal B Vaccine  Aged Out    Health Maintenance  Health Maintenance Due  Topic Date Due   Zoster Vaccines- Shingrix (1 of 2) Never done   COVID-19 Vaccine (3 - Pfizer risk series) 07/11/2020   Health Maintenance Items Addressed: Information provided on Shingrix ; records requested for last mammogram   Additional Screening:  Vision Screening: Recommended annual ophthalmology exams for early detection of glaucoma and other disorders of the eye. Would you like a referral to an eye doctor? No    Dental Screening: Recommended annual dental exams for proper oral hygiene  Community Resource Referral / Chronic Care Management: CRR required this visit?  No   CCM required this visit?  No   Plan:    I have personally reviewed and noted the following in the patient's chart:   Medical and social history Use of alcohol, tobacco or illicit drugs  Current medications and supplements including opioid prescriptions. Patient is not currently taking opioid prescriptions. Functional ability and status Nutritional status Physical activity Advanced directives List of other physicians Hospitalizations, surgeries, and ER visits in previous 12  months Vitals Screenings to include cognitive, depression, and falls Referrals and appointments  In addition, I have reviewed and discussed with patient certain preventive protocols, quality metrics, and best practice recommendations. A written personalized care plan for preventive services as well as general preventive health recommendations were provided to patient.   Cheryl Gordon, [place]   2[birth year]   After Visit Summary: (MyChart) Due to this being a telephonic visit, the after visit summary with patients personalized plan was offered to patient via MyChart   Notes: Nothing significant to report at this time.

## 2023-12-28 NOTE — Patient Instructions (Signed)
 Cheryl Gordon , Thank you for taking time out of your busy schedule to complete your Annual Wellness Visit with me. I enjoyed our conversation and look forward to speaking with you again next year. I, as well as your care team,  appreciate your ongoing commitment to your health goals. Please review the following plan we discussed and let me know if I can assist you in the future. Your Game plan/ To Do List     Follow up Visits: Next Medicare AWV with our clinical staff: In 1 year    Have you seen your provider in the last 6 months (3 months if uncontrolled diabetes)? Yes Next Office Visit with your provider: 06/11/24 @ 1:10  Clinician Recommendations:  Aim for 30 minutes of exercise or brisk walking, 6-8 glasses of water, and 5 servings of fruits and vegetables each day.       This is a list of the screening recommended for you and due dates:  Health Maintenance  Topic Date Due   Zoster (Shingles) Vaccine (1 of 2) Never done   COVID-19 Vaccine (3 - Pfizer risk series) 07/11/2020   Flu Shot  01/04/2024   Mammogram  01/18/2024   Medicare Annual Wellness Visit  12/27/2024   Colon Cancer Screening  06/27/2026   DTaP/Tdap/Td vaccine (3 - Td or Tdap) 06/19/2028   Pneumococcal Vaccine for age over 59  Completed   DEXA scan (bone density measurement)  Completed   Hepatitis C Screening  Completed   Hepatitis B Vaccine  Aged Out   HPV Vaccine  Aged Out   Meningitis B Vaccine  Aged Out    Advanced directives: (ACP Link)Information on Advanced Care Planning can be found at [employer] of [employer] Advance Health Care Directives Advance Health Care Directives. [email or web address]   Advance Care Planning is important because it:  [x]  Makes sure you receive the medical care that is consistent with your values, goals, and preferences  [x]  It provides guidance to your family and loved ones and reduces their decisional burden about whether or not they are making the right decisions based on your  wishes.  Follow the link provided in your after visit summary or read over the paperwork we have mailed to you to help you started getting your Advance Directives in place. If you need assistance in completing these, please reach out to us  so that we can help you!  See attachments for Preventive Care and Fall Prevention Tips.

## 2024-01-03 ENCOUNTER — Institutional Professional Consult (permissible substitution) (INDEPENDENT_AMBULATORY_CARE_PROVIDER_SITE_OTHER): Admitting: Physician Assistant

## 2024-01-07 ENCOUNTER — Ambulatory Visit (INDEPENDENT_AMBULATORY_CARE_PROVIDER_SITE_OTHER): Admitting: Physician Assistant

## 2024-01-07 ENCOUNTER — Encounter (INDEPENDENT_AMBULATORY_CARE_PROVIDER_SITE_OTHER): Payer: Self-pay | Admitting: Physician Assistant

## 2024-01-07 VITALS — BP 143/87 | HR 107 | Wt 140.0 lb

## 2024-01-07 DIAGNOSIS — H6123 Impacted cerumen, bilateral: Secondary | ICD-10-CM | POA: Diagnosis not present

## 2024-01-07 NOTE — Progress Notes (Signed)
 Dear Dr. Alphonsa, Here is my assessment for our mutual patient, Cheryl Gordon. Thank you for allowing me the opportunity to care for your patient. Please do not hesitate to contact me should you have any other questions. Sincerely, Chyrl Cohen PA-C  Otolaryngology Clinic Note Referring provider: Dr. Alphonsa HPI:  Cheryl Gordon is a 68 y.o. female kindly referred by Dr. Alphonsa   The patient is a 68 year old female seen in our office for evaluation of cerumen impaction.  The patient notes that in June she was swimming, she felt like she got water in her ears.  She was having trouble hearing out of the the left ear after this.  She denies any preceding hearing loss.  She denies any trauma to the ear.  She attempted using Debrox, alcohol, peroxide with no luck.  She saw her primary care provider who noted cerumen and referral to our office.      Independent Review of Additional Tests or Records:  Primary care office visit note on 12/03/2023   PMH/Meds/All/SocHx/FamHx/ROS:   Past Medical History:  Diagnosis Date   Chronic pain    Dependency on pain medication (HCC)    Hyperlipidemia    Hypertension 2002   Hypothyroid    Interstitial cystitis    Migraine headache    Spina bifida (HCC)      Past Surgical History:  Procedure Laterality Date   ABDOMINAL HYSTERECTOMY     CESAREAN SECTION     x3   COLONOSCOPY     OOPHORECTOMY      Family History  Problem Relation Age of Onset   Hypertension Father    Thyroid  disease Father    Hypertension Mother      Social Connections: Socially Integrated (12/28/2023)   Social Connection and Isolation Panel    Frequency of Communication with Friends and Family: More than three times a week    Frequency of Social Gatherings with Friends and Family: Three times a week    Attends Religious Services: More than 4 times per year    Active Member of Clubs or Organizations: Yes    Attends Engineer, structural: More than 4 times per year     Marital Status: Married      Current Outpatient Medications:    acetaminophen  (TYLENOL ) 500 MG tablet, Take 1,000 mg by mouth every 6 (six) hours as needed for moderate pain., Disp: , Rfl:    amLODipine  (NORVASC ) 5 MG tablet, TAKE ONE (1) TABLET BY MOUTH EVERY DAY, Disp: 90 tablet, Rfl: 1   buprenorphine  (SUBUTEX ) 8 MG SUBL SL tablet, 1 tablet under the tongue and allow to dissolve, Disp: , Rfl:    cholecalciferol (VITAMIN D) 1000 units tablet, Take 2,000 Units by mouth daily., Disp: , Rfl:    EPINEPHrine  0.3 mg/0.3 mL IJ SOAJ injection, Use as directed for severe allergy, Disp: 1 each, Rfl: 0   famotidine  (PEPCID ) 40 MG tablet, TAKE ONE (1) TABLET BY MOUTH EVERY DAY, Disp: 90 tablet, Rfl: 1   FLUoxetine  (PROZAC ) 20 MG capsule, Take 1 capsule (20 mg total) by mouth daily., Disp: 90 capsule, Rfl: 2   Garlic 1000 MG CAPS, Take 2 capsules by mouth daily., Disp: , Rfl:    levothyroxine  (SYNTHROID ) 100 MCG tablet, 1 qd, Disp: 90 tablet, Rfl: 2   MULTIPLE VITAMIN PO, Take 1 tablet by mouth daily at 12 noon., Disp: , Rfl:    potassium chloride  (KLOR-CON ) 10 MEQ tablet, TAKE ONE TABLET ( TOTAL) BY MOUTH DAILY, Disp: 90  tablet, Rfl: 1   promethazine  (PHENERGAN ) 25 MG tablet, TAKE ONE-HALF TO ONE TABLET BY MOUTH AT BEDTIME AS NEEDED AS DIRECTED, Disp: 30 tablet, Rfl: 5   rosuvastatin  (CRESTOR ) 5 MG tablet, TAKE ONE (1) TABLET BY MOUTH EVERY DAY, Disp: 90 tablet, Rfl: 1   Physical Exam:   BP (!) 143/87 (BP Location: Right Arm, Patient Position: Sitting, Cuff Size: Normal)   Pulse (!) 107   Wt 140 lb (63.5 kg)   SpO2 94%   BMI 26.45 kg/m   Pertinent Findings  CN II-XII intact Bilateral cerumen impaction Weber 512: equal Rinne 512: AC > BC b/l  Anterior rhinoscopy: Septum midline; bilateral inferior turbinates with atrophy No lesions of oral cavity/oropharynx; dentition numerous missing teeth No obviously palpable neck masses/lymphadenopathy/thyromegaly No respiratory distress or  stridor  Seprately Identifiable Procedures:  Procedure: Bilateral ear microscopy and cerumen removal using microscope (CPT [phone]) - Mod 50 Pre-procedure diagnosis: bilateral cerumen impaction external auditory canals Post-procedure diagnosis: same Indication: bilateral cerumen impaction; given patient's otologic complaints and history as well as for improved and comprehensive examination of external ear and tympanic membrane, bilateral otologic examination using microscope was performed and impacted cerumen removed  Procedure: Patient was placed semi-recumbent. Both ear canals were examined using the microscope with findings above. Cerumen removed from bilateral external auditory canals using suction and currette with improvement in EAC examination and patency. Left: EAC was patent. TM was intact . Middle ear was aerated. Drainage: none Right: EAC was patent. TM was intact . Middle ear was aerated . Drainage: none Patient tolerated the procedure well.   Impression & Plans:  Cheryl Gordon is a 68 y.o. female with the following   Cerumen impaction-  The patient presented today with cerumen impaction.  This was removed without difficulty.  I see no signs of infection.  The patient will reach out to the office if she develops any new or worsening signs or symptoms.  She does not feel she has any significant change to her baseline hearing and did not elect for audiology evaluation today.    - f/u PRN   Thank you for allowing me the opportunity to care for your patient. Please do not hesitate to contact me should you have any other questions.  Sincerely, Chyrl Cohen PA-C [facility] Phone: [phone] Fax: [phone]  01/07/2024, 1:44 PM

## 2024-01-23 ENCOUNTER — Other Ambulatory Visit: Payer: Self-pay | Admitting: Family Medicine

## 2024-04-24 ENCOUNTER — Other Ambulatory Visit: Payer: Self-pay | Admitting: Family Medicine

## 2024-04-24 DIAGNOSIS — E876 Hypokalemia: Secondary | ICD-10-CM

## 2024-04-30 ENCOUNTER — Ambulatory Visit: Payer: Self-pay | Admitting: Internal Medicine

## 2024-04-30 ENCOUNTER — Encounter: Payer: Self-pay | Admitting: Internal Medicine

## 2024-04-30 ENCOUNTER — Ambulatory Visit (INDEPENDENT_AMBULATORY_CARE_PROVIDER_SITE_OTHER): Admitting: Internal Medicine

## 2024-04-30 ENCOUNTER — Ambulatory Visit: Payer: Self-pay

## 2024-04-30 VITALS — BP 138/78 | HR 114 | Ht 61.0 in | Wt 139.6 lb

## 2024-04-30 DIAGNOSIS — R399 Unspecified symptoms and signs involving the genitourinary system: Secondary | ICD-10-CM | POA: Diagnosis not present

## 2024-04-30 DIAGNOSIS — N3000 Acute cystitis without hematuria: Secondary | ICD-10-CM | POA: Diagnosis not present

## 2024-04-30 MED ORDER — NITROFURANTOIN MONOHYD MACRO 100 MG PO CAPS: 100.0000 mg | ORAL_CAPSULE | Freq: Two times a day (BID) | ORAL | 0 refills | Status: DC

## 2024-04-30 NOTE — Progress Notes (Signed)
 Acute Office Visit  Subjective:    Patient ID: Cheryl Gordon, female    DOB: 1955/08/10, 68 y.o.   MRN: [id number]  Chief Complaint  Patient presents with   Urinary Tract Infection    Reports sx of uti ongoing since yesterday, sx worsened today.    HPI Patient is in today for complaint of dysuria, lower abdominal discomfort and urinary frequency since yesterday.  Denies any fever, chills. Denies nausea or vomiting.  She has history of UTI in the past.  Previous urine culture has shown E. coli and Proteus.  Past Medical History:  Diagnosis Date   Chronic pain    Dependency on pain medication (HCC)    Hyperlipidemia    Hypertension 2002   Hypothyroid    Interstitial cystitis    Migraine headache    Spina bifida (HCC)     Past Surgical History:  Procedure Laterality Date   ABDOMINAL HYSTERECTOMY     CESAREAN SECTION     x3   COLONOSCOPY     OOPHORECTOMY      Family History  Problem Relation Age of Onset   Hypertension Father    Thyroid  disease Father    Hypertension Mother     Social History   Socioeconomic History   Marital status: Widowed    Spouse name: Not on file   Number of children: Not on file   Years of education: Not on file   Highest education level: Not on file  Occupational History   Not on file  Tobacco Use   Smoking status: Former   Smokeless tobacco: Never  Substance and Sexual Activity   Alcohol use: No    Alcohol/week: 0.0 standard drinks of alcohol   Drug use: No   Sexual activity: Not on file  Other Topics Concern   Not on file  Social History Narrative   Not on file   Social Drivers of Health   Financial Resource Strain: Low Risk  (12/28/2023)   Overall Financial Resource Strain (CARDIA)    Difficulty of Paying Living Expenses: Not hard at all  Food Insecurity: No Food Insecurity (12/28/2023)   Hunger Vital Sign    Worried About Running Out of Food in the Last Year: Never true    Ran Out of Food in the Last Year: Never true   Transportation Needs: No Transportation Needs (12/28/2023)   PRAPARE - Administrator, Civil Service (Medical): No    Lack of Transportation (Non-Medical): No  Physical Activity: Sufficiently Active (12/28/2023)   Exercise Vital Sign    Days of Exercise per Week: 5 days    Minutes of Exercise per Session: 30 min  Stress: No Stress Concern Present (12/28/2023)   [employer] of Occupational Health - Occupational Stress Questionnaire    Feeling of Stress: Not at all  Social Connections: Socially Integrated (12/28/2023)   Social Connection and Isolation Panel    Frequency of Communication with Friends and Family: More than three times a week    Frequency of Social Gatherings with Friends and Family: Three times a week    Attends Religious Services: More than 4 times per year    Active Member of Clubs or Organizations: Yes    Attends Banker Meetings: More than 4 times per year    Marital Status: Married  Catering Manager Violence: Not At Risk (12/28/2023)   Humiliation, Afraid, Rape, and Kick questionnaire    Fear of Current or Ex-Partner: No  Emotionally Abused: No    Physically Abused: No    Sexually Abused: No    Outpatient Medications Prior to Visit  Medication Sig Dispense Refill   acetaminophen  (TYLENOL ) 500 MG tablet Take 1,000 mg by mouth every 6 (six) hours as needed for moderate pain.     amLODipine  (NORVASC ) 5 MG tablet TAKE ONE (1) TABLET BY MOUTH EVERY DAY 90 tablet 1   buprenorphine  (SUBUTEX ) 8 MG SUBL SL tablet 1 tablet under the tongue and allow to dissolve     cholecalciferol (VITAMIN D) 1000 units tablet Take 2,000 Units by mouth daily.     EPINEPHrine  0.3 mg/0.3 mL IJ SOAJ injection Use as directed for severe allergy 1 each 0   famotidine  (PEPCID ) 40 MG tablet TAKE ONE (1) TABLET BY MOUTH EVERY DAY 90 tablet 1   FLUoxetine  (PROZAC ) 20 MG capsule Take 1 capsule (20 mg total) by mouth daily. 90 capsule 2   Garlic 1000 MG CAPS Take 2  capsules by mouth daily.     levothyroxine  (SYNTHROID ) 100 MCG tablet 1 qd 90 tablet 2   MULTIPLE VITAMIN PO Take 1 tablet by mouth daily at 12 noon.     potassium chloride  (KLOR-CON ) 10 MEQ tablet TAKE ONE TABLET ( TOTAL) BY MOUTH DAILY 90 tablet 1   promethazine  (PHENERGAN ) 25 MG tablet TAKE ONE-HALF TO ONE TABLET BY MOUTH AT BEDTIME AS NEEDED AS DIRECTED 30 tablet 5   rosuvastatin  (CRESTOR ) 5 MG tablet TAKE ONE (1) TABLET BY MOUTH EVERY DAY 90 tablet 1   No facility-administered medications prior to visit.    Allergies  Allergen Reactions   Ace Inhibitors     Had anaphylactic reaction while on ACE inhibitor may or may not be the source-saw allergist October 2022 also had cough   Mirabegron Hives   Nsaids     Anaphylactic reaction around the time she was on a NSAID-saw allergist-October 2022   Sulfa Antibiotics Nausea And Vomiting   Zithromax [Azithromycin] Other (See Comments)    unknown    Review of Systems  Constitutional:  Negative for chills and fever.  HENT:  Negative for congestion, sinus pressure, sinus pain and sore throat.   Eyes:  Negative for pain and discharge.  Respiratory:  Negative for cough and shortness of breath.   Cardiovascular:  Negative for chest pain and palpitations.  Gastrointestinal:  Negative for abdominal pain, diarrhea, nausea and vomiting.  Endocrine: Negative for polydipsia and polyuria.  Genitourinary:  Positive for dysuria and frequency. Negative for hematuria.  Musculoskeletal:  Negative for neck pain and neck stiffness.  Skin:  Negative for rash.  Neurological:  Negative for dizziness and weakness.  Psychiatric/Behavioral:  Negative for agitation and behavioral problems.        Objective:    Physical Exam Vitals reviewed.  Constitutional:      General: She is not in acute distress.    Appearance: She is not diaphoretic.  HENT:     Head: Normocephalic and atraumatic.     Nose: Nose normal.     Mouth/Throat:     Mouth: Mucous  membranes are moist.  Eyes:     General: No scleral icterus.    Extraocular Movements: Extraocular movements intact.  Cardiovascular:     Rate and Rhythm: Normal rate and regular rhythm.     Heart sounds: Normal heart sounds. No murmur heard. Pulmonary:     Breath sounds: Normal breath sounds. No wheezing or rales.  Abdominal:     Palpations: Abdomen  is soft.     Tenderness: There is abdominal tenderness (Mild, suprapubic).  Musculoskeletal:     Right lower leg: No edema.     Left lower leg: No edema.  Skin:    General: Skin is warm.     Findings: No rash.  Neurological:     General: No focal deficit present.     Mental Status: She is alert and oriented to person, place, and time.  Psychiatric:        Mood and Affect: Mood normal.        Behavior: Behavior normal.     BP 138/78   Pulse (!) 114   Ht 5' 1 (1.549 m)   Wt 139 lb 9.6 oz (63.3 kg)   SpO2 94%   BMI 26.38 kg/m  Wt Readings from Last 3 Encounters:  04/30/24 139 lb 9.6 oz (63.3 kg)  01/07/24 140 lb (63.5 kg)  12/28/23 141 lb (64 kg)        Assessment & Plan:   Problem List Items Addressed This Visit       Genitourinary   UTI (urinary tract infection) - Primary   UA reviewed, shows positive LE Started Macrobid  Previous urine culture showed resistant E. coli to Bactrim Advised to maintain adequate hydration      Relevant Medications   nitrofurantoin , macrocrystal-monohydrate, (MACROBID ) 100 MG capsule   Other Relevant Orders   Urinalysis   Urine Culture     Meds ordered this encounter  Medications   nitrofurantoin , macrocrystal-monohydrate, (MACROBID ) 100 MG capsule    Sig: Take 1 capsule (100 mg total) by mouth 2 (two) times daily.    Dispense:  10 capsule    Refill:  0     Ruchi Stoney MARLA Blanch, MD

## 2024-04-30 NOTE — Telephone Encounter (Signed)
 Noted thank you

## 2024-04-30 NOTE — Assessment & Plan Note (Signed)
 UA reviewed, shows positive LE Started Macrobid  Previous urine culture showed resistant E. coli to Bactrim Advised to maintain adequate hydration

## 2024-04-30 NOTE — Patient Instructions (Signed)
 Please start taking Macrobid  as prescribed.  Please maintain at least 64 ounces of water intake in a day.

## 2024-04-30 NOTE — Telephone Encounter (Signed)
 Patient has an appointment with [facility] at 4:20 today 04/30/2024

## 2024-04-30 NOTE — Telephone Encounter (Signed)
 FYI Only or Action Required?: FYI only for provider: appointment scheduled on 04/30/24.  Patient was last seen in primary care on 12/03/2023 by Alphonsa Glendia LABOR, MD.  Called Nurse Triage reporting Urinary Retention.  Symptoms began today.  Interventions attempted: Nothing.  Symptoms are: gradually worsening.  Triage Disposition: No disposition on file.  Patient/caregiver understands and will follow disposition?:   Copied from CRM [id number]. Topic: Clinical - Red Word Triage >> Apr 30, 2024  3:04 PM Nathanel BROCKS wrote: Red Word that prompted transfer to Nurse Triage:   Possible uti. Very painful, bladder fills full and can't urinate. Having spasms.   ----------------------------------------------------------------------- From previous Reason for Contact - Scheduling: Patient/patient representative is calling to schedule an appointment. Refer to attachments for appointment information. Reason for Disposition  Urinating more frequently than usual (i.e., frequency) OR new-onset of the feeling of an urgent need to urinate (i.e., urgency)  Answer Assessment - Initial Assessment Questions Pt called in stating she is having bladder spasms and feels like she is unable to fully empty bladder. She states in the past she has had severe UTIs that have resulted in hospitalization. She has called several UC but wait times are too long and ED would be too expensive. Pt willing to travel to alternative offices to see a provider. Appointment scheduled for evaluation. Patient agrees with plan of care, and will call back if anything changes, or if symptoms worsen.    1. SYMPTOM: What's the main symptom you're concerned about? (e.g., frequency, incontinence)     Urinary retention, feels like she has a UTI   2. ONSET: When did the  UTI  start?     2 hours ago   3. PAIN: Is there any pain? If Yes, ask: How bad is it? (Scale: 1-10; mild, moderate, severe)    At onset pain was a 3/10 but is now  worsening 6-7  4. CAUSE: What do you think is causing the symptoms?     UTI  5. OTHER SYMPTOMS: Do you have any other symptoms? (e.g., blood in urine, fever, flank pain, pain with urination)     Denies blood in urine or fever; pt reports having bladder spasms  Protocols used: Urinary Symptoms-A-AH

## 2024-05-03 LAB — URINALYSIS
Bilirubin, UA: NEGATIVE
Glucose, UA: NEGATIVE
Ketones, UA: NEGATIVE
Nitrite, UA: NEGATIVE
Protein,UA: NEGATIVE
RBC, UA: NEGATIVE
Specific Gravity, UA: 1.025 (ref 1.005–1.030)
Urobilinogen, Ur: 0.2 mg/dL (ref 0.2–1.0)
pH, UA: 6.5 (ref 5.0–7.5)

## 2024-05-05 LAB — URINE CULTURE

## 2024-05-14 ENCOUNTER — Encounter: Payer: Self-pay | Admitting: Family Medicine

## 2024-05-14 NOTE — Telephone Encounter (Signed)
 Nurses I would recommend cephalexin  500 mg 1 taken 3 times daily for 5 days We will see her at her follow-up visit thanks

## 2024-05-15 MED ORDER — CEPHALEXIN 500 MG PO CAPS: ORAL_CAPSULE | ORAL | 0 refills | Status: DC

## 2024-05-20 ENCOUNTER — Ambulatory Visit: Admitting: Family Medicine

## 2024-05-20 VITALS — BP 139/84 | Ht 61.0 in | Wt 137.2 lb

## 2024-05-20 DIAGNOSIS — F1190 Opioid use, unspecified, uncomplicated: Secondary | ICD-10-CM | POA: Diagnosis not present

## 2024-05-20 DIAGNOSIS — I10 Essential (primary) hypertension: Secondary | ICD-10-CM

## 2024-05-20 DIAGNOSIS — E039 Hypothyroidism, unspecified: Secondary | ICD-10-CM

## 2024-05-20 DIAGNOSIS — N3001 Acute cystitis with hematuria: Secondary | ICD-10-CM

## 2024-05-20 DIAGNOSIS — E785 Hyperlipidemia, unspecified: Secondary | ICD-10-CM

## 2024-05-20 MED ORDER — FLUOXETINE HCL 40 MG PO CAPS: 40.0000 mg | ORAL_CAPSULE | Freq: Every day | ORAL | 1 refills | Status: AC

## 2024-05-20 MED ORDER — CEPHALEXIN 500 MG PO CAPS: ORAL_CAPSULE | ORAL | 0 refills | Status: AC

## 2024-05-20 NOTE — Progress Notes (Signed)
° °  Subjective:    Patient ID: Cheryl Gordon, female    DOB: August 09, 1955, 68 y.o.   MRN: [id number]  HPI Patient here for follow-up regarding cholesterol.    Patient relates taking medication on a regular basis Denies problems with medication Importance of dietary measures discussed Regular lab work regarding lipid and liver was checked and if needing additional labs was appropriately ordered  Patient with UTI never completely went away she is requesting different antibiotic she was not aware that the antibiotic was sent in you days ago it was resent today  Patient for blood pressure check up.  The patient does have hypertension.   Patient relates dietary measures try to minimize salt The importance of healthy diet and activity were discussed Patient relates compliance  Patient has a history of opioid misuse but she is doing much much better now She is doing a great job She is on buprenorphine  she has been consistent with its use.  Typically 1-1/2 every day  She would like send that doctor is retiring and she would like to have this prescribed more locally she is also interested in possibly coming off at some point  she has a history of hypothyroidism takes her medicine regular basis Labs from October reviewed  Review of Systems     Objective:   Physical Exam General-in no acute distress Eyes-no discharge Lungs-respiratory rate normal, CTA CV-no murmurs,RRR Extremities skin warm dry no edema Neuro grossly normal Behavior normal, alert        Assessment & Plan:  1. Essential hypertension (Primary) Blood pressure good control continue current measures watch diet  2. Hypothyroidism, unspecified type Continue thyroid  medicine check lab work in the spring  3. Hyperlipidemia, unspecified hyperlipidemia type Continue cholesterol medicine healthy diet.  Lab work like spring  4. Acute cystitis with hematuria Keflex  as directed next 5 days  5. Chronic, continuous use of  opioids We will look into the prescribing policy at [facility] health regarding this medicine I am open to assisting her but it is possible we may have to refer her out depending on policy We will keep the patient informed

## 2024-06-10 ENCOUNTER — Encounter: Payer: Self-pay | Admitting: Family Medicine

## 2024-06-11 ENCOUNTER — Ambulatory Visit: Admitting: Family Medicine

## 2024-06-24 ENCOUNTER — Other Ambulatory Visit: Payer: Self-pay | Admitting: Family Medicine

## 2024-11-18 ENCOUNTER — Ambulatory Visit: Admitting: Family Medicine

## 2025-01-02 ENCOUNTER — Ambulatory Visit
